# Patient Record
Sex: Female | Born: 1935 | Race: White | Hispanic: No | Smoking: Never smoker
Health system: Southern US, Community
[De-identification: ages and names within clinical notes are randomized; demographics above are authoritative.]

## PROBLEM LIST (undated history)

## (undated) DIAGNOSIS — M109 Gout, unspecified: Secondary | ICD-10-CM

## (undated) DIAGNOSIS — K5909 Other constipation: Secondary | ICD-10-CM

## (undated) DIAGNOSIS — E785 Hyperlipidemia, unspecified: Secondary | ICD-10-CM

## (undated) DIAGNOSIS — E119 Type 2 diabetes mellitus without complications: Secondary | ICD-10-CM

## (undated) DIAGNOSIS — M069 Rheumatoid arthritis, unspecified: Secondary | ICD-10-CM

## (undated) DIAGNOSIS — I10 Essential (primary) hypertension: Secondary | ICD-10-CM

## (undated) DIAGNOSIS — M1990 Unspecified osteoarthritis, unspecified site: Secondary | ICD-10-CM

## (undated) DIAGNOSIS — N184 Chronic kidney disease, stage 4 (severe): Secondary | ICD-10-CM

## (undated) DIAGNOSIS — G459 Transient cerebral ischemic attack, unspecified: Secondary | ICD-10-CM

## (undated) DIAGNOSIS — I4891 Unspecified atrial fibrillation: Secondary | ICD-10-CM

## (undated) DIAGNOSIS — M419 Scoliosis, unspecified: Secondary | ICD-10-CM

## (undated) HISTORY — PX: FOOT SURGERY: SHX648

## (undated) HISTORY — PX: OVARIAN CYST SURGERY: SHX726

## (undated) HISTORY — DX: Rheumatoid arthritis, unspecified: M06.9

## (undated) HISTORY — DX: Other constipation: K59.09

## (undated) HISTORY — PX: APPENDECTOMY: SHX54

## (undated) HISTORY — DX: Scoliosis, unspecified: M41.9

## (undated) HISTORY — PX: GALLBLADDER SURGERY: SHX652

## (undated) HISTORY — PX: ESOPHAGOGASTRODUODENOSCOPY ENDOSCOPY: SHX5814

## (undated) HISTORY — PX: BREAST CYST EXCISION: SHX579

## (undated) HISTORY — PX: EYE SURGERY: SHX253

## (undated) HISTORY — DX: Unspecified osteoarthritis, unspecified site: M19.90

## (undated) HISTORY — PX: HERNIA REPAIR: SHX51

## (undated) HISTORY — PX: DILATION AND CURETTAGE OF UTERUS: SHX78

## (undated) HISTORY — DX: Type 2 diabetes mellitus without complications: E11.9

## (undated) HISTORY — PX: COLONOSCOPY: SHX174

## (undated) HISTORY — PX: CARDIAC CATHETERIZATION: SHX172

## (undated) HISTORY — DX: Gout, unspecified: M10.9

---

## 2003-07-18 ENCOUNTER — Other Ambulatory Visit: Payer: Self-pay

## 2005-02-26 ENCOUNTER — Ambulatory Visit: Payer: Self-pay | Admitting: Internal Medicine

## 2006-03-05 ENCOUNTER — Ambulatory Visit: Payer: Self-pay | Admitting: Internal Medicine

## 2006-07-25 ENCOUNTER — Ambulatory Visit: Payer: Self-pay | Admitting: Internal Medicine

## 2007-03-02 ENCOUNTER — Ambulatory Visit: Payer: Self-pay | Admitting: Ophthalmology

## 2007-03-09 ENCOUNTER — Ambulatory Visit: Payer: Self-pay | Admitting: Internal Medicine

## 2007-03-16 ENCOUNTER — Ambulatory Visit: Payer: Self-pay | Admitting: Ophthalmology

## 2007-04-24 ENCOUNTER — Other Ambulatory Visit: Payer: Self-pay

## 2007-04-25 ENCOUNTER — Inpatient Hospital Stay: Payer: Self-pay | Admitting: Internal Medicine

## 2010-12-03 ENCOUNTER — Ambulatory Visit: Payer: Self-pay | Admitting: Internal Medicine

## 2012-07-22 ENCOUNTER — Ambulatory Visit: Payer: Self-pay | Admitting: Gastroenterology

## 2012-07-24 LAB — PATHOLOGY REPORT

## 2012-10-08 DEATH — deceased

## 2012-11-12 ENCOUNTER — Ambulatory Visit: Payer: Self-pay | Admitting: Gastroenterology

## 2012-12-14 ENCOUNTER — Ambulatory Visit: Payer: Self-pay | Admitting: Ophthalmology

## 2012-12-14 LAB — HEMOGLOBIN: HGB: 8.6 g/dL — ABNORMAL LOW (ref 12.0–16.0)

## 2012-12-14 LAB — POTASSIUM: Potassium: 4.4 mmol/L (ref 3.5–5.1)

## 2012-12-21 ENCOUNTER — Ambulatory Visit: Payer: Self-pay | Admitting: Ophthalmology

## 2013-05-19 ENCOUNTER — Ambulatory Visit (INDEPENDENT_AMBULATORY_CARE_PROVIDER_SITE_OTHER): Payer: Medicare Other | Admitting: Podiatry

## 2013-05-19 ENCOUNTER — Encounter: Payer: Self-pay | Admitting: Podiatry

## 2013-05-19 VITALS — BP 155/59 | HR 68 | Resp 16 | Ht 62.0 in | Wt 153.0 lb

## 2013-05-19 DIAGNOSIS — B351 Tinea unguium: Secondary | ICD-10-CM

## 2013-05-19 DIAGNOSIS — M79609 Pain in unspecified limb: Secondary | ICD-10-CM

## 2013-05-19 NOTE — Progress Notes (Signed)
Jericha presents today chief complaint of painful toenails one through 5 bilateral.  Objective: Vital signs are stable she is alert and oriented x3. Hammertoe deformities are noted bilateral. Distal clavi are noted to second digit bilateral. Nails are thick yellow dystrophic onychomycotic painful palpation.  Assessment: Pain in limb secondary to onychomycosis.  Plan: Debridement of all reactive hyperkeratosis and nails 1 through 5 bilateral is cover service secondary to pain.

## 2013-08-23 ENCOUNTER — Ambulatory Visit: Payer: Medicare Other | Admitting: Podiatry

## 2013-10-04 ENCOUNTER — Encounter: Payer: Self-pay | Admitting: Podiatry

## 2013-10-04 ENCOUNTER — Ambulatory Visit (INDEPENDENT_AMBULATORY_CARE_PROVIDER_SITE_OTHER): Payer: Medicare Other | Admitting: Podiatry

## 2013-10-04 VITALS — BP 118/68 | HR 62 | Resp 18

## 2013-10-04 DIAGNOSIS — M79609 Pain in unspecified limb: Secondary | ICD-10-CM

## 2013-10-04 DIAGNOSIS — B351 Tinea unguium: Secondary | ICD-10-CM

## 2013-10-04 MED ORDER — GABAPENTIN 600 MG PO TABS: 600.0000 mg | ORAL_TABLET | Freq: Three times a day (TID) | ORAL | Status: DC

## 2013-10-04 NOTE — Progress Notes (Signed)
She presents today with a chief complaint of painful elongated toenails and calluses to her toes.  Objective: Vital signs are stable she is alert and oriented x3 pulses are palpable bilateral. Nails are thick yellow dystrophic and mycotic painful palpation and she has painful reactive hyperkeratosis plantar aspect of the bilateral foot.  Assessment: Pain in limb secondary onychomycosis.  Plan: Debridement of nails thickness and length as cover service secondary to pain.

## 2014-01-03 ENCOUNTER — Encounter: Payer: Self-pay | Admitting: Podiatry

## 2014-01-03 ENCOUNTER — Ambulatory Visit: Payer: Self-pay

## 2014-01-03 ENCOUNTER — Ambulatory Visit (INDEPENDENT_AMBULATORY_CARE_PROVIDER_SITE_OTHER): Payer: Medicare Other | Admitting: Podiatry

## 2014-01-03 VITALS — Temp 97.2°F

## 2014-01-03 DIAGNOSIS — I739 Peripheral vascular disease, unspecified: Secondary | ICD-10-CM

## 2014-01-03 DIAGNOSIS — L97509 Non-pressure chronic ulcer of other part of unspecified foot with unspecified severity: Secondary | ICD-10-CM

## 2014-01-03 DIAGNOSIS — B351 Tinea unguium: Secondary | ICD-10-CM

## 2014-01-03 DIAGNOSIS — M79676 Pain in unspecified toe(s): Secondary | ICD-10-CM

## 2014-01-03 DIAGNOSIS — M109 Gout, unspecified: Secondary | ICD-10-CM

## 2014-01-03 DIAGNOSIS — M79609 Pain in unspecified limb: Secondary | ICD-10-CM

## 2014-01-03 MED ORDER — MUPIROCIN 2 % EX OINT: 1.0000 "application " | TOPICAL_OINTMENT | Freq: Two times a day (BID) | CUTANEOUS | Status: DC

## 2014-01-03 MED ORDER — AMOXICILLIN-POT CLAVULANATE 875-125 MG PO TABS: 1.0000 | ORAL_TABLET | Freq: Two times a day (BID) | ORAL | Status: DC

## 2014-01-03 NOTE — Progress Notes (Signed)
She presents today for routine nail debridement. However her daughter states that the end of her toe is black and she refers to the fourth digit of the right foot. She's concerned about the redness on the dorsal medial aspect of the right foot. She also states malodor.  Objective: Pulses are nonpalpable to the right lower extremity mildly palpable to the left lower extremity. Toes appear to be ischemic in that there is considerable palor on dependency. However she has a superficial ulceration to the medial aspect of the first metatarsophalangeal joint with..  Assessment: Pain in limb secondary to onychomycosis 1 through 5 bilateral. Pain in limb secondary to peripheral vascular disease right foot with ulceration distal aspect fourth toe right and medial aspect of the first metatarsophalangeal joint right with cellulitis.  Plan: Debridement of areas of reactive hyperkeratosis and wounds. She will start at home therapy of Epsom salts warm water soaks with application of Bactroban ointment. We also started her on Augmentin 875 one by mouth twice a day. She will also received a Darco shoe and a referral to vascular department.

## 2014-01-05 ENCOUNTER — Telehealth: Payer: Self-pay | Admitting: *Deleted

## 2014-01-05 NOTE — Telephone Encounter (Signed)
Left message for sandi, pts daughter to return my call. appt [facility] 8[facility].15 at 8.30.

## 2014-01-05 NOTE — Telephone Encounter (Signed)
Spoke with sandi letting her know marys appt is at [facility] on 8.24.15 at 8:30. pts daughter understood.

## 2014-01-11 ENCOUNTER — Inpatient Hospital Stay: Payer: Self-pay | Admitting: Internal Medicine

## 2014-01-11 LAB — URINALYSIS, COMPLETE
Bacteria: NONE SEEN
GLUCOSE, UR: NEGATIVE mg/dL (ref 0–75)
Hyaline Cast: 3
LEUKOCYTE ESTERASE: NEGATIVE
Nitrite: NEGATIVE
PH: 5 (ref 4.5–8.0)
PROTEIN: NEGATIVE
SPECIFIC GRAVITY: 1.015 (ref 1.003–1.030)
Squamous Epithelial: 1
WBC UR: <1 /HPF (ref 0–5)

## 2014-01-11 LAB — CBC WITH DIFFERENTIAL/PLATELET
Basophil #: 0.1 10*3/uL (ref 0.0–0.1)
Basophil %: 0.8 %
Eosinophil #: 0.1 10*3/uL (ref 0.0–0.7)
Eosinophil %: 1.9 %
HCT: 27.4 % — ABNORMAL LOW (ref 35.0–47.0)
HGB: 8.8 g/dL — ABNORMAL LOW (ref 12.0–16.0)
Lymphocyte #: 0.8 10*3/uL — ABNORMAL LOW (ref 1.0–3.6)
Lymphocyte %: 11 %
MCH: 32 pg (ref 26.0–34.0)
MCHC: 32 g/dL (ref 32.0–36.0)
MCV: 100 fL (ref 80–100)
MONO ABS: 0.8 x10 3/mm (ref 0.2–0.9)
MONOS PCT: 11.8 %
NEUTROS ABS: 5.3 10*3/uL (ref 1.4–6.5)
Neutrophil %: 74.5 %
Platelet: 192 10*3/uL (ref 150–440)
RBC: 2.74 10*6/uL — AB (ref 3.80–5.20)
RDW: 18.5 % — AB (ref 11.5–14.5)
WBC: 7.1 10*3/uL (ref 3.6–11.0)

## 2014-01-11 LAB — T4, FREE: Free Thyroxine: 0.93 ng/dL (ref 0.76–1.46)

## 2014-01-11 LAB — HEPATIC FUNCTION PANEL A (ARMC)
ALBUMIN: 2.7 g/dL — AB (ref 3.4–5.0)
ALK PHOS: 67 U/L
AST: 22 U/L (ref 15–37)
Bilirubin,Total: 0.3 mg/dL (ref 0.2–1.0)
SGPT (ALT): 20 U/L
Total Protein: 6.9 g/dL (ref 6.4–8.2)

## 2014-01-11 LAB — BASIC METABOLIC PANEL
Anion Gap: 13 (ref 7–16)
BUN: 87 mg/dL — AB (ref 7–18)
CALCIUM: 8.5 mg/dL (ref 8.5–10.1)
Chloride: 103 mmol/L (ref 98–107)
Co2: 21 mmol/L (ref 21–32)
Creatinine: 5.22 mg/dL — ABNORMAL HIGH (ref 0.60–1.30)
EGFR (African American): 8 — ABNORMAL LOW
EGFR (Non-African Amer.): 7 — ABNORMAL LOW
Glucose: 77 mg/dL (ref 65–99)
OSMOLALITY: 299 (ref 275–301)
Potassium: 4.2 mmol/L (ref 3.5–5.1)
Sodium: 137 mmol/L (ref 136–145)

## 2014-01-11 LAB — TROPONIN I

## 2014-01-11 LAB — TSH: THYROID STIMULATING HORM: 2.2 u[IU]/mL

## 2014-01-11 LAB — LIPASE, BLOOD: Lipase: 102 U/L (ref 73–393)

## 2014-01-12 LAB — CBC WITH DIFFERENTIAL/PLATELET
BASOS PCT: 0.8 %
Basophil #: 0.1 10*3/uL (ref 0.0–0.1)
Eosinophil #: 0.2 10*3/uL (ref 0.0–0.7)
Eosinophil %: 2.8 %
HCT: 27.7 % — AB (ref 35.0–47.0)
HGB: 9.1 g/dL — ABNORMAL LOW (ref 12.0–16.0)
Lymphocyte #: 0.7 10*3/uL — ABNORMAL LOW (ref 1.0–3.6)
Lymphocyte %: 10.1 %
MCH: 32.3 pg (ref 26.0–34.0)
MCHC: 33 g/dL (ref 32.0–36.0)
MCV: 98 fL (ref 80–100)
MONOS PCT: 9 %
Monocyte #: 0.6 x10 3/mm (ref 0.2–0.9)
Neutrophil #: 5.1 10*3/uL (ref 1.4–6.5)
Neutrophil %: 77.3 %
PLATELETS: 189 10*3/uL (ref 150–440)
RBC: 2.83 10*6/uL — AB (ref 3.80–5.20)
RDW: 18.3 % — ABNORMAL HIGH (ref 11.5–14.5)
WBC: 6.7 10*3/uL (ref 3.6–11.0)

## 2014-01-12 LAB — PROTEIN / CREATININE RATIO, URINE
CREATININE, URINE: 23.5 mg/dL — AB (ref 30.0–125.0)
PROTEIN, RANDOM URINE: 14 mg/dL — AB (ref 0–12)
PROTEIN/CREAT. RATIO: 596 mg/g{creat} — AB (ref 0–200)

## 2014-01-12 LAB — BASIC METABOLIC PANEL
ANION GAP: 10 (ref 7–16)
BUN: 77 mg/dL — AB (ref 7–18)
CALCIUM: 8.5 mg/dL (ref 8.5–10.1)
CO2: 25 mmol/L (ref 21–32)
Chloride: 104 mmol/L (ref 98–107)
Creatinine: 3.77 mg/dL — ABNORMAL HIGH (ref 0.60–1.30)
EGFR (Non-African Amer.): 10 — ABNORMAL LOW
GFR CALC AF AMER: 12 — AB
Glucose: 107 mg/dL — ABNORMAL HIGH (ref 65–99)
OSMOLALITY: 301 (ref 275–301)
Potassium: 4.9 mmol/L (ref 3.5–5.1)
SODIUM: 139 mmol/L (ref 136–145)

## 2014-01-12 LAB — PHOSPHORUS: Phosphorus: 5.9 mg/dL — ABNORMAL HIGH (ref 2.5–4.9)

## 2014-01-13 LAB — COMPREHENSIVE METABOLIC PANEL
ANION GAP: 8 (ref 7–16)
AST: 21 U/L (ref 15–37)
Albumin: 2.3 g/dL — ABNORMAL LOW (ref 3.4–5.0)
Alkaline Phosphatase: 62 U/L
BILIRUBIN TOTAL: 0.3 mg/dL (ref 0.2–1.0)
BUN: 58 mg/dL — AB (ref 7–18)
CO2: 25 mmol/L (ref 21–32)
Calcium, Total: 8.5 mg/dL (ref 8.5–10.1)
Chloride: 108 mmol/L — ABNORMAL HIGH (ref 98–107)
Creatinine: 2.09 mg/dL — ABNORMAL HIGH (ref 0.60–1.30)
GFR CALC AF AMER: 24 — AB
GFR CALC NON AF AMER: 21 — AB
GLUCOSE: 107 mg/dL — AB (ref 65–99)
Osmolality: 298 (ref 275–301)
POTASSIUM: 3.8 mmol/L (ref 3.5–5.1)
SGPT (ALT): 16 U/L
SODIUM: 141 mmol/L (ref 136–145)
TOTAL PROTEIN: 6.3 g/dL — AB (ref 6.4–8.2)

## 2014-01-13 LAB — CBC WITH DIFFERENTIAL/PLATELET
Basophil #: 0.1 10*3/uL (ref 0.0–0.1)
Basophil %: 1.1 %
EOS ABS: 0.2 10*3/uL (ref 0.0–0.7)
Eosinophil %: 3.3 %
HCT: 26.9 % — AB (ref 35.0–47.0)
HGB: 9.3 g/dL — ABNORMAL LOW (ref 12.0–16.0)
LYMPHS ABS: 1 10*3/uL (ref 1.0–3.6)
LYMPHS PCT: 17.6 %
MCH: 33.2 pg (ref 26.0–34.0)
MCHC: 34.5 g/dL (ref 32.0–36.0)
MCV: 96 fL (ref 80–100)
MONOS PCT: 6.2 %
Monocyte #: 0.3 x10 3/mm (ref 0.2–0.9)
Neutrophil #: 4 10*3/uL (ref 1.4–6.5)
Neutrophil %: 71.8 %
Platelet: 234 10*3/uL (ref 150–440)
RBC: 2.8 10*6/uL — ABNORMAL LOW (ref 3.80–5.20)
RDW: 18.1 % — ABNORMAL HIGH (ref 11.5–14.5)
WBC: 5.5 10*3/uL (ref 3.6–11.0)

## 2014-01-13 LAB — PHOSPHORUS: PHOSPHORUS: 3.8 mg/dL (ref 2.5–4.9)

## 2014-01-13 LAB — PROTEIN ELECTROPHORESIS(ARMC)

## 2014-01-14 LAB — BASIC METABOLIC PANEL
ANION GAP: 7 (ref 7–16)
BUN: 36 mg/dL — ABNORMAL HIGH (ref 7–18)
CALCIUM: 8.8 mg/dL (ref 8.5–10.1)
CO2: 27 mmol/L (ref 21–32)
Chloride: 107 mmol/L (ref 98–107)
Creatinine: 1.52 mg/dL — ABNORMAL HIGH (ref 0.60–1.30)
EGFR (Non-African Amer.): 31 — ABNORMAL LOW
GFR CALC AF AMER: 35 — AB
GLUCOSE: 126 mg/dL — AB (ref 65–99)
OSMOLALITY: 291 (ref 275–301)
Potassium: 4 mmol/L (ref 3.5–5.1)
Sodium: 141 mmol/L (ref 136–145)

## 2014-01-15 LAB — UR PROT ELECTROPHORESIS, URINE RANDOM

## 2014-01-31 ENCOUNTER — Other Ambulatory Visit: Payer: Self-pay | Admitting: *Deleted

## 2014-01-31 MED ORDER — GABAPENTIN 600 MG PO TABS: 600.0000 mg | ORAL_TABLET | Freq: Three times a day (TID) | ORAL | Status: DC

## 2014-01-31 NOTE — Telephone Encounter (Signed)
[facility] sent refill req for gabapentin 600 mg #90 with 11 refills. Take by mouth 3 times daily. Per dr Al Corpus refill.

## 2014-03-30 ENCOUNTER — Ambulatory Visit (INDEPENDENT_AMBULATORY_CARE_PROVIDER_SITE_OTHER): Payer: Medicare Other | Admitting: Podiatry

## 2014-03-30 DIAGNOSIS — B351 Tinea unguium: Secondary | ICD-10-CM

## 2014-03-30 DIAGNOSIS — M79673 Pain in unspecified foot: Secondary | ICD-10-CM

## 2014-03-30 NOTE — Progress Notes (Signed)
She presents today with a chief complaint of painful elongated toenails and calluses plantar aspect the bilateral foot.  Objective: Pulses are palpable bilateral. Nails are thick yellow dystrophic onychomycotic and painful palpation. Hammertoes are rigid and reactive hyperkeratosis distally noted to each toe.  Assessment: Pain in limb secondary to onychomycosis and porokeratosis bilateral.  Plan: Debridement of all reactive hyperkeratosis and debridement of nails 1 through 5 bilateral.

## 2014-06-01 ENCOUNTER — Ambulatory Visit: Payer: Medicare Other | Admitting: Podiatry

## 2014-06-01 ENCOUNTER — Ambulatory Visit (INDEPENDENT_AMBULATORY_CARE_PROVIDER_SITE_OTHER): Payer: Medicare Other | Admitting: Podiatry

## 2014-06-01 DIAGNOSIS — B351 Tinea unguium: Secondary | ICD-10-CM

## 2014-06-01 DIAGNOSIS — L03119 Cellulitis of unspecified part of limb: Secondary | ICD-10-CM

## 2014-06-01 DIAGNOSIS — L02619 Cutaneous abscess of unspecified foot: Secondary | ICD-10-CM

## 2014-06-01 DIAGNOSIS — M79676 Pain in unspecified toe(s): Secondary | ICD-10-CM

## 2014-06-01 MED ORDER — CLINDAMYCIN HCL 150 MG PO CAPS: 150.0000 mg | ORAL_CAPSULE | Freq: Three times a day (TID) | ORAL | Status: DC

## 2014-06-01 NOTE — Progress Notes (Signed)
She presents today with a chief complaint of painful elongated toenails and calluses plantar aspect the bilateral foot. She also is complaining of a red hot swollen legs.  Objective: Pulses are palpable bilateral. Nails are thick yellow dystrophic onychomycotic and painful palpation. Hammertoes are rigid and reactive hyperkeratosis distally noted to each toe. Legs are swollen and tense with erythema ascending to the upper part of the calf.  Assessment: Pain in limb secondary to onychomycosis and porokeratosis bilateral. Cellulitis bilateral lower extremities secondary to venous stasis dermatitis and venous insufficiency.  Plan: Debridement of all reactive hyperkeratosis and debridement of nails 1 through 5 bilateral. Prescription for clindamycin 150 mg 3 times a day for 2 weeks should this not resolve her symptoms she will follow-up with [place] immediately should she develop fever chills nausea vomiting she was instructed to go to the ED.

## 2014-06-22 ENCOUNTER — Ambulatory Visit (INDEPENDENT_AMBULATORY_CARE_PROVIDER_SITE_OTHER): Payer: Medicare Other

## 2014-06-22 ENCOUNTER — Telehealth: Payer: Self-pay | Admitting: Podiatry

## 2014-06-22 ENCOUNTER — Ambulatory Visit (INDEPENDENT_AMBULATORY_CARE_PROVIDER_SITE_OTHER): Payer: Medicare Other | Admitting: Podiatry

## 2014-06-22 VITALS — BP 97/59 | HR 44 | Resp 16

## 2014-06-22 DIAGNOSIS — L03031 Cellulitis of right toe: Secondary | ICD-10-CM

## 2014-06-22 DIAGNOSIS — S92911A Unspecified fracture of right toe(s), initial encounter for closed fracture: Secondary | ICD-10-CM

## 2014-06-22 DIAGNOSIS — L02611 Cutaneous abscess of right foot: Secondary | ICD-10-CM

## 2014-06-22 NOTE — Progress Notes (Signed)
She presents today with her daughter concerned about a red swollen fifth digit of the right foot. She states that her mother seemed to resolve the cellulitis in the leg however states the leg is still painful but has been suffering was with an exquisitely painful fifth digit right foot. Denies any trauma to the foot. Denies fever chills nausea vomiting. Denies any changes in her past medical history medications or allergies.  Objective: Vital signs are stable she is alert and oriented 3. Pulses are palpable right. Erythema and edema in the right calf has subsided however the skin is still tender to the touch. There is no blistering or ulceration noted. The right fifth metatarsophalangeal joint does demonstrate some ecchymosis and fiery red erythema and a painful proximal phalanx fifth digit right foot. Radiographic evaluation does demonstrate what appears to be a transverse fracture of the fifth proximal phalanx right foot. This is nondisplaced non-comminuted. She very well may be having a gout flare associated with this but states the pain is subsiding from its worst. So at this point we will not treat gout.  Assessment: Fractured fifth digit right foot. Possible gouty capsulitis fifth metatarsophalangeal joint right foot.  Plan: Encouraged the use of a Darco shoe which they have at home and I will follow-up with her at her next scheduled appointment. She will watch for signs and symptoms of infection notify us if there are any and proceed to the emergency department.

## 2014-06-30 ENCOUNTER — Ambulatory Visit: Payer: Self-pay | Admitting: Unknown Physician Specialty

## 2014-08-08 ENCOUNTER — Ambulatory Visit (INDEPENDENT_AMBULATORY_CARE_PROVIDER_SITE_OTHER): Payer: Medicare Other | Admitting: Podiatry

## 2014-08-08 DIAGNOSIS — M79676 Pain in unspecified toe(s): Secondary | ICD-10-CM

## 2014-08-08 DIAGNOSIS — B351 Tinea unguium: Secondary | ICD-10-CM

## 2014-08-08 NOTE — Progress Notes (Signed)
She presents today with a chief complaint of painful elongated toenails and calluses plantar aspect the bilateral foot.  Objective: Pulses are palpable bilateral. Nails are thick yellow dystrophic onychomycotic and painful palpation. Hammertoes are rigid and reactive hyperkeratosis distally noted to each toe.  Assessment: Pain in limb secondary to onychomycosis and porokeratosis bilateral.  Plan: Debridement of all reactive hyperkeratosis and debridement of nails 1 through 5 bilateral. 

## 2014-09-30 NOTE — Op Note (Signed)
PATIENT NAME:  Kelsey Chung, Kelsey Chung MR#:  [id number] DATE OF BIRTH:  [birth year]  DATE OF PROCEDURE:  12/21/2012  PREOPERATIVE DIAGNOSIS: Cataract, right eye.   POSTOPERATIVE DIAGNOSIS: Cataract, right eye.   PROCEDURE PERFORMED: Extracapsular cataract extraction using phacoemulsification with placement of an Alcon [id number] 23.5-diopter posterior chamber lens, serial number [id number].   SURGEON: Maylon Peppers. Subrena Devereux, M.D.   ANESTHESIA: 4% lidocaine and 0.75% Marcaine in a 50-50 mixture with 10 units/mL of Hylenex added, given as a peribulbar.   ANESTHESIOLOGIST: Dr. Noralyn Pick.   COMPLICATIONS: None.   ESTIMATED BLOOD LOSS: Less than 1 mL.   DESCRIPTION OF PROCEDURE:  The patient was brought to the operating room and given a peribulbar block.  The patient was then prepped and draped in the usual fashion.  The vertical rectus muscles were imbricated using 5-0 silk sutures.  These sutures were then clamped to the sterile drapes as bridle sutures.  A limbal peritomy was performed extending two clock hours and hemostasis was obtained with cautery.  A partial thickness scleral groove was made at the surgical limbus and dissected anteriorly in a lamellar dissection using an Alcon crescent knife.  The anterior chamber was entered superonasally with a Superblade and through the lamellar dissection with a 2.6 mm keratome.  DisCoVisc was used to replace the aqueous and a continuous tear capsulorrhexis was carried out.  Hydrodissection and hydrodelineation were carried out with balanced salt and a 27 gauge canula.  The nucleus was rotated to confirm the effectiveness of the hydrodissection.  Phacoemulsification was carried out using a divide-and-conquer technique.  Total ultrasound time was 1 minute and 25 seconds with an average power of 24.3 percent and CDE 37.02.  Irrigation/aspiration was used to remove the residual cortex.  DisCoVisc was used to inflate the capsule and the internal incision was enlarged  to 3 mm with the crescent knife.  The intraocular lens was folded and inserted into the capsular bag using the AcrySert delivery system.  Irrigation/aspiration was used to remove the residual DisCoVisc.  Miostat was injected into the anterior chamber through the paracentesis track to inflate the anterior chamber and induce miosis. A tenth of a milliliter of cefuroxime was placed into the anterior chamber via the paracentesis tract. The wound was checked for leaks and none were found. The conjunctiva was closed with cautery and the bridle sutures were removed.  Two drops of 0.3% Vigamox were placed on the eye.   An eye shield was placed on the eye.  The patient was discharged to the recovery room in good condition. ____________________________ Maylon Peppers Denorris Reust, MD sad:sb D: 12/21/2012 12:09:31 ET T: 12/21/2012 13:14:40 ET JOB#: [id number]  cc: Viviann Spare A. Demarus Latterell, MD, <Dictator> Erline Levine MD ELECTRONICALLY SIGNED 12/28/2012 12:12

## 2014-10-01 NOTE — Discharge Summary (Signed)
Dates of Admission and Diagnosis:  Date of Admission 11-Jan-2014   Date of Discharge 14-Jan-2014   Admitting Diagnosis Acute renal failure   Final Diagnosis Acute renal failure, ATN   Discharge Diagnosis 1 Acute renal failure, ATN   2 GERD   3 Scoliosis    Chief Complaint/History of Present Illness 81 year old lady admitted with acute renal failure and poor oral intake, secondary to glossitis. Patient recently took a course of Amoxicillin. Initial creatinine of 5.22 with normal potassium level of 4.2   Allergies:  Sulfa drugs: Anaphylaxis  Quinidine: Rash  Excedrin: Other  Beta Blockers: Unknown  NSAIDS: Other    Thyroid:  04-Aug-15 13:18   Thyroid Stimulating Hormone 2.20 (0.45-4.50 (International Unit)  ----------------------- Pregnant patients have  different reference  ranges for TSH:  - - - - - - - - - -  Pregnant, first trimetser:  0.36 - 2.50 uIU/mL)  Thyroxine, Free 0.93 (Result(s) reported on 11 Jan 2014 at 08:00PM.)  Hepatic:  04-Aug-15 13:18   Bilirubin, Total 0.3  Alkaline Phosphatase 67 (46-116 NOTE: New Reference Range 12/28/13)  SGPT (ALT) 20 (14-63 NOTE: New Reference Range 12/28/13)  SGOT (AST) 22  Total Protein, Serum 6.9  Albumin, Serum  2.7  Bilirubin, Direct < 0.1 (Result(s) reported on 11 Jan 2014 at 05:56PM.)  General Ref:  04-Aug-15 13:18   T3 Uptake ========== TEST NAME ==========  ========= RESULTS =========  = REFERENCE RANGE =  T3 UPTAKE  T3 Uptake T3 Uptake                       [   28 %                 ]             [address]               [facility]            No: [id number]          [address] [place], [place] [address]           Lindon Romp, MD         [phone]   Result(s) reported on 12 Jan 2014 at 06:17AM.  Free T3, Serum ========== TEST NAME ==========  ========= RESULTS =========  = REFERENCE RANGE =  FREE T3, SERUM  Triiodothyronine,Free,Serum Triiodothyronine,Free,Serum     [L  1.8  pg/mL            ]           2.0-4.4               [facility]     No: [id number]           [address], [place], [place] [address]           Lindon Romp, MD         [phone]   Result(s) reported on 12 Jan 2014 at 08:19AM.  05-Aug-15 13:23   ANA w/Reflex to 9 Conf.Tests ========== TEST NAME ==========  ========= RESULTS =========  = REFERENCE RANGE =  ANA W/REFLEX-9 CONF.TEST  ANA w/Reflex if Positive ANA Direct                      [   Negative             ]          Negative  [facility]            No: [id number]           [address] [place], [place] [address]           Lindon Romp, MD         [phone]   Result(s) reported on 13 Jan 2014 at 05:28PM.  Parathyroid Hormone, Intact ========== TEST NAME ==========  ========= RESULTS =========  = REFERENCE RANGE =  PTH INTACT  PTH, Intact PTH, Intact                     [H  80 pg/mL             ]             [address]               [facility]            No: [id number]           [address], [place], [place] [address]           Lindon Romp, MD         [phone]   Result(s) reported on 13 Jan 2014 at 08:22AM.  Protein Electrophoresis, Serum ========== TEST NAME ==========  ========= RESULTS =========  = REFERENCE RANGE =  PROTEIN ELECTROPHORESIS  Protein Electro.,S Protein, Total, Serum           [L  5.8 g/dL             ]           6.0-8.5 Albumin                         [L  3.0 g/dL             ]           3.2-5.6 Alpha-1-Globulin                [   0.3 g/dL             ]           0.1-0.4 Alpha-2-Globulin                [   0.9 g/dL             ]           0.4-1.2 Beta Globulin                   [   0.7 g/dL             ]           0.6-1.3 Gamma Globulin                  [   0.9 g/dL             ]           0.5-1.6 M-Spike                         [   Not Observed g/dL    ]      Not Observed Globulin, Total                 [   2.8 g/dL              ]  2.0-4.5 A/G Ratio                       [   1.1                  ]           0.7-2.0 Please note:                    [   Final Report         ]                   Protein electrophoresis scan will follow via computer, mail, or courier delivery.             [facility]            No: [id number]           [address] [place], [place] [address]           Lindon Romp, MD         [phone]   Result(s) reported on 13 Jan 2014 at 05:28PM.  Vitamin D, 25-Hydroxy, Serum ========== TEST NAME ==========  ========= RESULTS =========  = REFERENCE RANGE =  VITAMIN D  Vitamin D, 25-Hydroxy Vitamin D, 25-Hydroxy           [   37.0 ng/mL           ]        30.0-100.0 Vitamin D deficiency has been defined by the [place] practice guideline as a level of serum 25-OH vitamin D less than 20 ng/mL (1,2). The Endocrine Society went on to further define vitamin D insufficiency as a level between 21 and 29 ng/mL (2). 1. IOM (Institute of Medicine). 2010. Dietary reference    intakes for calcium and D. [place]: The    [employer]. 2. Holick MF, Binkley Stanton, Bischoff-Ferrari HA, et al.    Evaluation, treatment, and prevention of vitamin D    deficiency: an Endocrine Society clinical practice    guideline. JCEM. 2011 Jul; 96(7):1911-30.               [facility]            No: [id number]           [address], [place], [place] [address]           Lindon Romp, MD         [phone]   Result(s) reported on 13 Jan 2014 at 05:28PM.  Routine Chem:  04-Aug-15 13:18   Glucose, Serum 77  BUN  87  Creatinine (comp)  5.22  Sodium, Serum 137  Potassium, Serum 4.2  Chloride, Serum 103  CO2, Serum 21  Calcium (Total), Serum 8.5  Anion Gap 13  Osmolality (calc) 299  eGFR (African American)  8  eGFR (Non-African American)  7 (eGFR values <40m/min/1.73 m2 may be an indication of chronic kidney  disease (CKD). Calculated eGFR is useful in patients with stable renal function. The eGFR calculation will not be reliable in acutely ill patients when serum creatinine is changing rapidly. It is not useful in  patients on dialysis. The eGFR calculation may not be applicable to patients at the low and high extremes of body sizes, pregnant women, and vegetarians.)  Lipase 102 (Result(s) reported on 11 Jan 2014 at 05:45PM.)  05-Aug-15 04:07   Creatinine (comp)  3.77  06-Aug-15 04:13   Creatinine (comp)  2.09  07-Aug-15 04:27   Glucose, Serum  126  BUN  36  Creatinine (comp)  1.52  Sodium, Serum 141  Potassium, Serum 4.0  Chloride, Serum 107  CO2, Serum 27  Calcium (Total), Serum 8.8  Anion Gap 7  Osmolality (calc) 291  eGFR (African American)  35  eGFR (Non-African American)  31 (eGFR values <40m/min/1.73 m2 may be an indication of chronic kidney disease (CKD). Calculated eGFR is useful in patients with stable renal function. The eGFR calculation will not be reliable in acutely ill patients when serum creatinine is changing rapidly. It is not useful in  patients on dialysis. The eGFR calculation may not be applicable to patients at the low and high extremes of body sizes, pregnant women, and vegetarians.)  Cardiac:  04-Aug-15 13:18   Troponin I < 0.02 (0.00-0.05 0.05 ng/mL or less: NEGATIVE  Repeat testing in 3-6 hrs  if clinically indicated. >0.05 ng/mL: POTENTIAL  MYOCARDIAL INJURY. Repeat  testing in 3-6 hrs if  clinically indicated. NOTE: An increase or decrease  of 30% or more on serial  testing suggests a  clinically important change)  Routine Hem:  04-Aug-15 13:18   WBC (CBC) 7.1  RBC (CBC)  2.74  Hemoglobin (CBC)  8.8  Hematocrit (CBC)  27.4  Platelet Count (CBC) 192  MCV 100  MCH 32.0  MCHC 32.0  RDW  18.5  Neutrophil % 74.5  Lymphocyte % 11.0  Monocyte % 11.8  Eosinophil % 1.9  Basophil % 0.8  Neutrophil # 5.3  Lymphocyte #  0.8  Monocyte # 0.8   Eosinophil # 0.1  Basophil # 0.1 (Result(s) reported on 11 Jan 2014 at 01:50PM.)   PERTINENT RADIOLOGY STUDIES: XRay:    06-Aug-15 14:16, Thoracic Spine AP and Lateral  Thoracic Spine AP and Lateral   REASON FOR EXAM:    mid back pain  COMMENTS:       PROCEDURE: DXR - DXR THORACIC  AP AND LATERAL  - Jan 13 2014  2:16PM     CLINICAL DATA:  Back pain    EXAM:  THORACIC SPINE - 2 VIEW    COMPARISON:  None.    FINDINGS:  Scoliosis concave to the left in the lumbar spine is noted with some  compensatory changes in the thoracic spine. No hemivertebra are  noted. The pedicles are within normal limits and no paraspinal mass  lesion is noted. No definitive compression deformities are seen.  Diffuse aortic calcifications are noted.     IMPRESSION:  No acute abnormality seen.      Electronically Signed    By: MInez CatalinaM.D.    On: 01/13/2014 14:19         Verified By: MEverlene Farrier M.D.,  [place]    05-Aug-15 10:35, [place]  [place]   REASON FOR EXAM:    acute renal failure  COMMENTS:       PROCEDURE: [place] - [place]  - Jan 12 2014 10:35AM     CLINICAL DATA:  Acute renal failure.    EXAM:  RENAL/URINARY TRACT ULTRASOUND COMPLETE    COMPARISON:  None.    FINDINGS:  Right Kidney:  Length: Approximately 9.2 cm. No hydronephrosis. Diffuse cortical  thinning. Normal parenchymal echotexture. No focal parenchymal  abnormality. No visible shadowing calculi.    Left Kidney:    Length: Approximately 8.5 cm. No hydronephrosis. Diffuse cortical  thinning.  Normal parenchymal echotexture. No focal parenchymal  abnormality. No visible shadowing calculi.    Bladder:    Decompressed by Foley catheter.     IMPRESSION:  1. No evidence of hydronephrosis involving either kidney.  2. Diffuse cortical thinning involving both kidneys without focal  parenchymal abnormalities.      Electronically Signed    By: Evangeline Dakin M.D.    On: 01/12/2014  11:37         Verified By: Deniece Portela, M.D.,   Pertinent Past History:  Pertinent Past History Scoliosis GERD   Hospital Course:  Hospital Course 81 year old lady admitted with acute renal failure and poor oral intake, secondary to glossitis. Patient recently took a course of Amoxicillin. Acute renal failure: likely ATN, creatinine improved significantly with i.v hydration. Patient was evaluated by nephrology and renal u/s was done. Pantoprazole was continued for GERD. Back pain relieved by tramadol prn. 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 Creatinine improved to 1.52 prior to discharge. Patient will follow up with nephrology and Dr. Ginette Pitman as out-patient. Exam: Alert, oriented, NAD. HENT: No JVD, Chest: clear, CVS: RRR. Abdomen: benign. Ext: no edema. Plan is to discharge home today. Family member at bedside. PPI, Omeprazole discontinued for interaction with Methotrexate. Switched to Ranitidine 150 mg daily. Patient will resume her usual antihypertensive medications at home. Patient will follow up with Dr. Ginette Pitman and Nephrology as out patient.   Condition on Discharge Stable   DISCHARGE INSTRUCTIONS HOME MEDS:  Medication Reconciliation: Patient's Home Medications at Discharge:     Medication Instructions  gabapentin 300 mg oral capsule  1 cap(s) orally 3 times a day    losartan 100 mg oral tablet  1 tab(s) orally once a day   hydrochlorothiazide 25 mg oral tablet  1 tab(s) orally 2 times a day   clonidine 0.2 mg oral tablet  1 tab(s) orally 2 times a day   tramadol 50 mg oral tablet  1 tab(s) orally 3 times a day   allopurinol 100 mg oral tablet  1 tab(s) orally 2 times a day   hydralazine 25 mg oral tablet  1 tab(s) orally 3 times a day   simvastatin 20 mg oral tablet  1 tab(s) orally once a day (at bedtime)   methotrexate 2.5 mg oral tablet  4 tab(s) orally once a week on Monday   glipizide 5 mg oral tablet  1 tab(s) orally 2 times a day   vitamin b-12   1 tab(s) orally once a day   ferrous sulfate 325 mg oral tablet  1 tab(s) orally once a day   chondroitin-glucosamine with multiple vitamins and minerals oral capsule  1 cap(s) orally 2 times a day   vitamin d3  1 tab(s) orally once a day   multivitamin  1 tab(s) orally once a day   lactulose 10 g/15 ml oral syrup  15 milliliter(s) orally 2 times a day   fish oil - oral capsule  1 cap(s) orally 2 times a day   turmeric tablet  1 tab(s) orally once a day   ropinirole 2 mg oral tablet  2 tab(s) orally once a day (at bedtime), As Needed for legs   mupirocin topical 2% topical ointment  1 application nasal 2 times a day   triamcinolone topical 0.1% topical cream  Apply topically to affected area on legs 2 times a day   ranitidine  150 milligram(s) orally once a day -for Indigestion, Heartburn  thiamine 250 mg oral tablet  2 tab(s) orally once a day   antacid/nystatin/diphenhydramine mouthwash  10 milliliter(s) orally 3 times a day, As Needed - for Indigestion, Heartburn    PRESCRIPTIONS: ELECTRONICALLY SUBMITTED  STOP TAKING THE FOLLOWING MEDICATION(S):    omeprazole 20 mg oral delayed release capsule: 1 cap(s) orally once a day amoxicillin-clavulanate 875 mg-125 mg oral tablet: 1 tab(s) orally 2 times a day for 10 days  Physician's Instructions:  Diet Renal Diet   Activity Limitations As tolerated   Return to Work Not Applicable   Time frame for Follow Up Appointment 1-2 weeks  Dr. Ginette Pitman   Time frame for Follow Up Appointment 1-2 weeks  Nephrology     Ginette Pitman, Vishwanath(Attending Physician): [facility], [address], [place], [place] [address], Bruce, [place] N(Consultant): [facility], [address] [place], [place], [place] [address], [place] [phone]  Electronic Signatures: Glendon Axe (MD)  (Signed 07-Aug-15 12:21)  Authored: ADMISSION DATE AND DIAGNOSIS, CHIEF COMPLAINT/HPI, Allergies, PERTINENT LABS,  PERTINENT RADIOLOGY STUDIES, PERTINENT PAST HISTORY, HOSPITAL COURSE, [place], PATIENT INSTRUCTIONS, Follow Up Physician   Last Updated: 07-Aug-15 12:21 by Glendon Axe (MD)

## 2014-10-01 NOTE — H&P (Signed)
PATIENT NAME:  Kelsey Chung, Kelsey Chung MR#:  [id number] DATE OF BIRTH:  [birth year]  DATE OF ADMISSION:  01/11/2014  PRIMARY CARE PHYSICIAN:  Barbette Reichmann, MD REFERRING EMERGENCY ROOM PHYSICIAN: Eartha Inch. York Cerise, MD  CHIEF COMPLAINT: Dizziness and tongue swelling.   HISTORY OF PRESENT ILLNESS: An 81 year old female with history of diabetes, hypertension and hyperlipidemia, with gout, osteoarthritis, and  had chronic renal failure with baseline creatinine of 1.7, in 11/2013. Lives with her husband who is 92 years, and with Alzheimer disease, having some hospice at home for her husband; patient is able to walk with a walker, but very slow. Her daughter lives nearby and is taking care almost every day for the patient and her husband. For the last few days, she is not eating or drinking enough and getting weak and dizzy, and since yesterday she started complaining of some burning sensation in her tongue. She said that she did not eat something which could burn her tongue, but she has the sensation, and she has also noticed some swelling on her tongue. So, daughter took her to walk-in clinic and they referred her to Emergency Room. In ER, on initial work-up they found her creatinine more than 5, and so given to hospitalist team for admission.   Patient has some hearing deficit and so history obtained from her daughter, Ms. [name] who is healthcare power of attorney and present in the room.  According to her she did not have any shortness of breath or swelling in the legs, but she has decreased urination for the last 1 to 2 days.   PAST MEDICAL HISTORY:  1.  Diabetes.  2.  Hypertension.  3.  Hyperlipidemia.  4.  Gout.  5.  Osteoarthritis.  6.  A Transient  ischemic attacks.  7.  Gastroesophageal reflux disease.  8.  Chronic renal failure, creatinine 1.7 in 11/2013.   REVIEW OF SYSTEMS: Unable to get as patient has hearing deficit.   PAST SURGICAL HISTORY:  1.  Appendectomy.  2.  Ovarian cyst removal.   3.  Breast cyst removal.  4.  Cholecystectomy.  5.  Hernia removal surgery.  6.  Toe surgery and amputation.   SOCIAL HISTORY: Lives with her husband. No smoking or alcohol or drug use.   FAMILY HISTORY: Father had history of atherosclerosis. No history of coronary artery disease in family; history of breast cancer and hypertension in the family.   HOME MEDICATIONS: 1.  Vitamin D3 one tablet once a day.  2.  Vitamin B12 once a day.  4.  Triamcinolone topical 0.1% topical cream to affected area 2 times a day.  5.  Tramadol 50 mg oral 2 to 3 times a day.  6.  Simvastatin 20 mg oral once a day.  8.  Omeprazole 20 mg oral once a day.  9.  Multivitamin tablet once a day.  10.  Methotrexate 2.5 mg oral tablet 4 tablets once a week.  11.  Losartan 100 mg oral once a day.  12.  Lactulose 15 mL oral 2 times a day.  13.  Hydrochlorothiazide 25 mg 2 times a day.  14.  Hydralazine 25 mg oral 3 times a day.  15.  Glipizide 5 mg oral tablet 2 times a day.  16.  Gabapentin 300 mg oral 3 times a day.  17.  Ferrous sulfate 325 mg oral once a day.  18.  Clonidine 0.2 mg oral 2 times a day.  19.  Amoxicillin and clavulanate 2 times a day.  VITAL SIGNS: In ER, temperature 98.4, pulse is 54, respirations 20, blood pressure 100/44, pulse oximetry is 97% on room air.   PHYSICAL EXAMINATION:  GENERAL: The patient is fully alert and oriented, slight hearing deficit but appropriate with history taking and physical examination.  HEENT: Head and neck atraumatic; conjunctivae pink, oral mucosa moist.  NECK: Supple. No JVD.  RESPIRATORY: Bilateral equal and clear air entry.  CARDIOVASCULAR: S1, S2, present, regular, systolic murmur present.  ABDOMEN: Soft, nontender. Bowel sounds present. No organomegaly.  SKIN: No rashes.  LEGS: No edema.  NEUROLOGIC: Power 4 out of 5, moving all 4 limbs.  JOINTS: No swelling or tenderness.    Glucose 77, BUN 87,  sodium 137, potassium 4.2, chloride 103, CO2 is 21,  calcium is 8.5, troponin less than 0.02, WBC 7.1, hemoglobin 8.8, platelet count 192,000, MCV 100.   Chest x-ray no congestion.   ASSESSMENT AND PLAN: An 81 year old female with multiple medical problems and chronic renal failure with her decreased eating for the last few days; came to hospital with tongue swelling and problem of burning and found having acute renal failure.  1.  Acute on chronic renal failure. Creatinine is 5.2, no pulmonary edema, or signs of respiratory distress; so we will admit with intravenous fluid and continue monitoring with nephrology follow-ups in the hospital. Check renal function tomorrow; currently no electrolyte imbalance, so no urgent need of hemodialysis.  2.  Glossitis.  Patient is complaining of tongue burning sensation. On examination the tongue is slightly red with some small papules all over and some black dots, most likely it is glossitis,  vitamin deficiency. We will continue on multivitamins and further work-up as per primary care physician.  3.  Hypertension. Blood pressure is stable. We will continue home medication but hold hydrochlorothiazide as renal failure.  4.  Hyperlipidemia: Continue statin.  5.  Diabetes. We will hold oral agents because of renal failure and we will put her on insulin sliding scale coverage.   Healthcare power of attorney is Ms. [name], her daughter, who is present in the room.   TOTAL TIME SPENT IN THIS ADMISSION: 50 minutes.    ____________________________ Hope Pigeon Elisabeth Pigeon, MD vgv:nt D: 01/11/2014 17:38:41 ET T: 01/11/2014 18:02:49 ET JOB#: [id number]  cc: Hope Pigeon. Elisabeth Pigeon, MD, <Dictator> Barbette Reichmann, MD  Altamese Dilling MD ELECTRONICALLY SIGNED 01/17/2014 8:19

## 2014-10-10 ENCOUNTER — Ambulatory Visit (INDEPENDENT_AMBULATORY_CARE_PROVIDER_SITE_OTHER): Payer: Medicare Other | Admitting: Podiatry

## 2014-10-10 DIAGNOSIS — B351 Tinea unguium: Secondary | ICD-10-CM | POA: Diagnosis not present

## 2014-10-10 DIAGNOSIS — L89891 Pressure ulcer of other site, stage 1: Secondary | ICD-10-CM

## 2014-10-10 DIAGNOSIS — M79676 Pain in unspecified toe(s): Secondary | ICD-10-CM

## 2014-10-10 DIAGNOSIS — L97521 Non-pressure chronic ulcer of other part of left foot limited to breakdown of skin: Secondary | ICD-10-CM

## 2014-10-10 MED ORDER — CLINDAMYCIN HCL 150 MG PO CAPS: 150.0000 mg | ORAL_CAPSULE | Freq: Three times a day (TID) | ORAL | Status: DC

## 2014-10-10 MED ORDER — MUPIROCIN 2 % EX OINT: 1.0000 "application " | TOPICAL_OINTMENT | Freq: Two times a day (BID) | CUTANEOUS | Status: DC

## 2014-10-10 NOTE — Progress Notes (Signed)
She presents today with a chief complaint of painful elongated toenails and calluses plantar aspect the bilateral foot. Also, tender L sub 1 area recently.  Objective: Pulses are palpable bilateral. Nails are thick yellow dystrophic onychomycotic and painful palpation. Hammertoes are rigid and reactive hyperkeratosis distally noted to each toe. Superficial ulceration is apparent some first metatarsophalangeal joint of the left foot with mild surrounding erythema. Once debridement and demonstrates no purulence no malodor superficial skin breakdown some first metatarsophalangeal joint overlying the tibial sesamoid.  Assessment: Pain in limb secondary to onychomycosis and porokeratosis bilateral. Ulceration left with cellulitis.  Plan: Debridement of all reactive hyperkeratosis and debridement of nails 1 through 5 bilateral. Wrote prescription for clindamycin twice daily as well as Bactroban ointment to be applied after soaking. I will follow-up with her for the ulceration in 2 weeks and nail debridement in 3 months.

## 2014-10-14 ENCOUNTER — Other Ambulatory Visit: Payer: Self-pay

## 2014-10-14 ENCOUNTER — Telehealth: Payer: Self-pay

## 2014-10-14 MED ORDER — DOXYCYCLINE HYCLATE 100 MG PO TABS: 100.0000 mg | ORAL_TABLET | Freq: Two times a day (BID) | ORAL | Status: DC

## 2014-10-14 NOTE — Telephone Encounter (Signed)
Pt daughter called stating that her mother's eyes were becoming red and swollen and worsened over night, she also stated that her mother was feeling nauseated. Pt was put on clindamycin on Monday. Advised her to stop taking the current antibiotic, and i would call in a replacement abt ( doxycycline), daughter stated that her mother has not had any reactions to doxycycline in the past. Advise her to watch for s/s of reaction n/v, swelling, difficulty breathing and if she experiences any of these symptoms she is to stop the medication and seek medical advise.

## 2014-10-24 ENCOUNTER — Ambulatory Visit (INDEPENDENT_AMBULATORY_CARE_PROVIDER_SITE_OTHER): Payer: Medicare Other | Admitting: Podiatry

## 2014-10-24 DIAGNOSIS — L97521 Non-pressure chronic ulcer of other part of left foot limited to breakdown of skin: Secondary | ICD-10-CM

## 2014-10-24 NOTE — Progress Notes (Signed)
She presents today for follow-up of ulceration sub-first metatarsophalangeal joint of the left foot. She continues CONSERVATIVE therapies. She denies fever chills nausea vomiting muscle aches and pains.  Objective: Vital signs are stable she is alert and oriented 3. Reactive hyperkeratosis of first metatarsophalangeal joint left was debrided reveals a 1 cm long fissure that does not probe deep to bone superficial reactive hygienic granuloma is present. Debrided that for her today. No malodor.  Assessment: Superficial ulceration sub-first metatarsophalangeal joint left foot.  Plan: Continue all conservative therapies soaking and dressing applications. I'll follow-up with her in 2 weeks.

## 2014-11-09 ENCOUNTER — Ambulatory Visit (INDEPENDENT_AMBULATORY_CARE_PROVIDER_SITE_OTHER): Payer: Medicare Other | Admitting: Podiatry

## 2014-11-09 VITALS — BP 179/84 | HR 90 | Resp 16

## 2014-11-09 DIAGNOSIS — L97521 Non-pressure chronic ulcer of other part of left foot limited to breakdown of skin: Secondary | ICD-10-CM

## 2014-11-09 DIAGNOSIS — L89891 Pressure ulcer of other site, stage 1: Secondary | ICD-10-CM | POA: Diagnosis not present

## 2014-11-09 NOTE — Progress Notes (Signed)
She presents today for follow-up of ulceration sub-first metatarsophalangeal joint of the left foot. Her daughter states that she thinks is doing much better.  Objective: Vital signs are stable she is alert and oriented 3. Pulses remain unchanged superficial ulceration plantar aspect first metatarsophalangeal joint does not appear to be infected. Reactive hyperkeratosis is present. I debrided this today and debrided the wound to bleeding. There is no signs of infection.  Assessment: Ulceration plantar aspect left foot.  Plan: Continue all conservative therapies follow up with her in 3 weeks.

## 2014-11-30 ENCOUNTER — Encounter: Payer: Self-pay | Admitting: Podiatry

## 2014-11-30 ENCOUNTER — Ambulatory Visit (INDEPENDENT_AMBULATORY_CARE_PROVIDER_SITE_OTHER): Payer: Medicare Other | Admitting: Podiatry

## 2014-11-30 VITALS — BP 125/68 | HR 86 | Resp 16

## 2014-11-30 DIAGNOSIS — L97521 Non-pressure chronic ulcer of other part of left foot limited to breakdown of skin: Secondary | ICD-10-CM

## 2014-11-30 DIAGNOSIS — L89891 Pressure ulcer of other site, stage 1: Secondary | ICD-10-CM

## 2014-11-30 NOTE — Progress Notes (Signed)
She presents today for follow-up of ulceration sub-first metatarsophalangeal joint of the left foot. Her daughter states that she thinks is doing much better and patient still relates pain with ambulation.   Objective: Vital signs are stable she is alert and oriented 3. Pulses remain unchanged superficial ulceration plantar aspect first metatarsophalangeal joint left does not appear to be infected as well as the 4th digit distal tip right. Reactive hyperkeratosis is present. I debrided this today and debrided the wound to bleeding. There is no signs of infection.  Assessment: Ulceration plantar aspect left foot and distal tip 4th toe right.  Plan: Continue all conservative therapies including epsom salt soaks and mupirocin ointment. Will follow up with her in 2 weeks.

## 2014-12-14 ENCOUNTER — Ambulatory Visit (INDEPENDENT_AMBULATORY_CARE_PROVIDER_SITE_OTHER): Payer: Medicare Other | Admitting: Podiatry

## 2014-12-14 ENCOUNTER — Ambulatory Visit: Payer: Medicare Other | Admitting: Podiatry

## 2014-12-14 DIAGNOSIS — B351 Tinea unguium: Secondary | ICD-10-CM

## 2014-12-14 DIAGNOSIS — M79676 Pain in unspecified toe(s): Secondary | ICD-10-CM | POA: Diagnosis not present

## 2014-12-14 DIAGNOSIS — L89891 Pressure ulcer of other site, stage 1: Secondary | ICD-10-CM

## 2014-12-14 DIAGNOSIS — I739 Peripheral vascular disease, unspecified: Secondary | ICD-10-CM

## 2014-12-14 DIAGNOSIS — L03031 Cellulitis of right toe: Secondary | ICD-10-CM

## 2014-12-14 DIAGNOSIS — L02611 Cutaneous abscess of right foot: Secondary | ICD-10-CM

## 2014-12-14 DIAGNOSIS — M79673 Pain in unspecified foot: Secondary | ICD-10-CM

## 2014-12-14 MED ORDER — CEPHALEXIN 500 MG PO CAPS: 500.0000 mg | ORAL_CAPSULE | Freq: Three times a day (TID) | ORAL | Status: DC

## 2014-12-14 NOTE — Progress Notes (Signed)
She presents today for follow-up of ulceration plantar aspect of the left foot and distal aspect of the third digit right foot. She states that she has more redness and swelling in her legs and her daughter confirms 2 small areas of concern one on each leg. She denies fever chills nausea vomiting muscle aches and pains.  Objective: Pulses are minimally palpable bilateral. Edema to the bilateral legs and calves. She has erythema surrounding a superficial skin infection measuring less than 2 cm in diameter right anterolateral leg mid diaphyseal region. Left leg demonstrates a very small abrasion less than a 5 mm in diameter. No signs of infection there. I'm concerned for cellulitis due to the venous insufficiency and venous stasis dermatitis that she is developed. The ulcerations appear to be healing nicely with the overlying reactive hyperkeratosis which was debrided today does not demonstrate any active ulcerative lesion. Toenails are thick yellow dystrophic like mycotic and painful palpation as well as debridement.  Assessment: Peripheral vascular disease with venous insufficiency stemming to cellulitis with superficial ulcerative lesion anterior right leg. Ulcerations plantar aspect of the bilateral foot appear to be healing after debridement. Debrided painful elongated thickened dystrophic mycotic nails.  Plan: Debrided ulcerations today. Place her on Keflex 500 mg 1 by mouth 3 times a day. Started her with elevation of the bilateral lower extremity and light dressing changes to the right leg. Debrided nails bilaterally. Follow up with her in 1 month

## 2015-01-16 ENCOUNTER — Ambulatory Visit (INDEPENDENT_AMBULATORY_CARE_PROVIDER_SITE_OTHER): Payer: Medicare Other | Admitting: Podiatry

## 2015-01-16 ENCOUNTER — Encounter: Payer: Self-pay | Admitting: Podiatry

## 2015-01-16 VITALS — BP 142/68 | HR 99 | Resp 16

## 2015-01-16 DIAGNOSIS — L97521 Non-pressure chronic ulcer of other part of left foot limited to breakdown of skin: Secondary | ICD-10-CM

## 2015-01-16 DIAGNOSIS — L89891 Pressure ulcer of other site, stage 1: Secondary | ICD-10-CM

## 2015-01-16 NOTE — Progress Notes (Signed)
She presents today for follow-up of her ulceration to the third digit of the right foot. She denies fever chills nausea vomiting muscle aches and pains and states it seems to be doing better.  Objective: Vital signs are stable she is alert and oriented 3. Pulses are barely palpable bilateral capillary fill time is immediate. Feet are warm to the touch. Ulceration third digit right foot demonstrates well-healing site with reactive hyperkeratosis. Recommended hyperkeratosis is also noted to the plantar aspect first metatarsophalangeal joint left foot.  Assessment: Well-healing ulcerations.  Plan: Debridement of the wounds today to bleeding no signs of infection. Redressed dry sterile compressive dressing follow-up with her in 3-4 weeks.

## 2015-02-01 ENCOUNTER — Other Ambulatory Visit: Payer: Self-pay | Admitting: Podiatry

## 2015-02-01 NOTE — Telephone Encounter (Signed)
Pt needs an appt prior to future refills. 

## 2015-02-15 ENCOUNTER — Ambulatory Visit (INDEPENDENT_AMBULATORY_CARE_PROVIDER_SITE_OTHER): Payer: Medicare Other | Admitting: Podiatry

## 2015-02-15 ENCOUNTER — Encounter: Payer: Self-pay | Admitting: Podiatry

## 2015-02-15 VITALS — BP 148/84 | HR 82 | Resp 16

## 2015-02-15 DIAGNOSIS — L89891 Pressure ulcer of other site, stage 1: Secondary | ICD-10-CM | POA: Diagnosis not present

## 2015-02-15 DIAGNOSIS — L97521 Non-pressure chronic ulcer of other part of left foot limited to breakdown of skin: Secondary | ICD-10-CM

## 2015-02-15 MED ORDER — CLINDAMYCIN HCL 150 MG PO CAPS: 150.0000 mg | ORAL_CAPSULE | Freq: Three times a day (TID) | ORAL | Status: DC

## 2015-02-15 NOTE — Progress Notes (Signed)
She presents today with chief complaint of a painful right fourth toe. She denies fever chills nausea vomiting muscle aches and pains. She states this than a little red and tender.  Objective: Vital signs are stable she is alert and oriented 3 pulses are palpable right. Hammertoe deformity fourth digit right foot resulting in a distal clavus once debrided does demonstrate mild. Some mild odor with mild erythema to the level of the PIPJ.  Assessment: Hammertoe deformity with chronic ulceration right foot.  Plan: Start her back on clindamycin and she will start her soaks. I will follow-up with her in 2 weeks.

## 2015-03-06 DIAGNOSIS — I10 Essential (primary) hypertension: Secondary | ICD-10-CM | POA: Insufficient documentation

## 2015-03-20 ENCOUNTER — Ambulatory Visit (INDEPENDENT_AMBULATORY_CARE_PROVIDER_SITE_OTHER): Payer: Medicare Other | Admitting: Podiatry

## 2015-03-20 ENCOUNTER — Encounter: Payer: Self-pay | Admitting: Podiatry

## 2015-03-20 VITALS — BP 143/72 | HR 88 | Resp 16

## 2015-03-20 DIAGNOSIS — L89891 Pressure ulcer of other site, stage 1: Secondary | ICD-10-CM

## 2015-03-20 DIAGNOSIS — L97521 Non-pressure chronic ulcer of other part of left foot limited to breakdown of skin: Secondary | ICD-10-CM

## 2015-03-20 NOTE — Progress Notes (Signed)
She presents today for follow-up of ulceration to the fourth digit of the right foot and sub-first metatarsophalangeal joint of the left foot. She states that my toes just a sore all the time I don't know whether to wear shoes are not to wear shoes.  Objective: Vital signs are stable she's alert and oriented 3. Pulses are barely palpable bilateral hammertoe deformities are noted bilateral resulting in a distal clavus was soft tissue breakdown to the distal aspect of the fourth digit right foot. She also has mallet toe deformity of the left foot resulting in pressure beneath the first metatarsophalangeal joint of the left foot which was debrided does demonstrate superficial ulceration. Neither one of these ulcerations today demonstrate any type of purulence or malodor or signs of infection.  Assessment: Chronic ulcerations fourth digit right foot sub-first metatarsophalangeal joint left foot.  Plan: Discussed etiology pathology conservative versus surgical therapies. I encouraged her to continue the use of tennis shoes even in the house with deeper toe boxes and elongated shoes. I debrided all reactive hyperkeratoses and ulcers today to bleeding and I encouraged her daughter to continue dressing changes on a daily basis area follow up with me should she develop signs and symptoms of worsening infection notify me with any questions or concerns.  Arbutus Ped DPM

## 2015-04-24 ENCOUNTER — Other Ambulatory Visit: Payer: Self-pay | Admitting: Podiatry

## 2015-05-22 ENCOUNTER — Ambulatory Visit (INDEPENDENT_AMBULATORY_CARE_PROVIDER_SITE_OTHER): Payer: Medicare Other | Admitting: Podiatry

## 2015-05-22 ENCOUNTER — Encounter: Payer: Self-pay | Admitting: Podiatry

## 2015-05-22 VITALS — BP 161/101 | HR 75 | Resp 16

## 2015-05-22 DIAGNOSIS — L89891 Pressure ulcer of other site, stage 1: Secondary | ICD-10-CM

## 2015-05-22 DIAGNOSIS — L97511 Non-pressure chronic ulcer of other part of right foot limited to breakdown of skin: Secondary | ICD-10-CM

## 2015-05-22 DIAGNOSIS — L97521 Non-pressure chronic ulcer of other part of left foot limited to breakdown of skin: Secondary | ICD-10-CM

## 2015-05-22 MED ORDER — DOXYCYCLINE HYCLATE 100 MG PO TABS: 100.0000 mg | ORAL_TABLET | Freq: Two times a day (BID) | ORAL | Status: DC

## 2015-05-22 NOTE — Progress Notes (Signed)
She presents today chief complaint of a painful ulceration fourth digit of the right foot. She's also complaining of painful lesion sub-first metatarsophalangeal joint of the left foot. She denies fever chills nausea vomiting muscle aches and pains.  Objective: Vital signs demonstrates significant hypertension today and I recommended that she follow-up with her primary care provider. Otherwise pulses are palpable bilateral. Distal clavus fourth digit right foot once debrided demonstrates ulceration with malodor. No cellulitis no drainage. This does not probe to bone. Attention was directed to the sub-first metatarsophalangeal joint of the left foot which demonstrates a superficial skin breakdown with no odor and no cellulitis.  Assessment: Chronic ulceration fourth toe right foot distal clavus with localized infection. Ulceration sub-first metatarsophalangeal joint left foot.  Plan: Debrided these areas today sharply with a prescription for doxycycline for her right foot. She will soak twice daily and apply Bactroban ointment and I will follow-up with her in a couple weeks. I placed padding to the contralateral foot and recommended padding.

## 2015-05-24 ENCOUNTER — Ambulatory Visit
Admission: RE | Admit: 2015-05-24 | Discharge: 2015-05-24 | Disposition: A | Discharge status: Home / Self Care | Payer: Medicare Other | Source: Ambulatory Visit | Attending: Gastroenterology | Admitting: Gastroenterology

## 2015-05-24 ENCOUNTER — Encounter
Admission: RE | Disposition: A | Discharge status: Home / Self Care | Payer: Self-pay | Source: Ambulatory Visit | Attending: Gastroenterology

## 2015-05-24 DIAGNOSIS — H9190 Unspecified hearing loss, unspecified ear: Secondary | ICD-10-CM | POA: Diagnosis not present

## 2015-05-24 DIAGNOSIS — Z9842 Cataract extraction status, left eye: Secondary | ICD-10-CM | POA: Diagnosis not present

## 2015-05-24 DIAGNOSIS — R1310 Dysphagia, unspecified: Secondary | ICD-10-CM | POA: Insufficient documentation

## 2015-05-24 DIAGNOSIS — M1990 Unspecified osteoarthritis, unspecified site: Secondary | ICD-10-CM | POA: Insufficient documentation

## 2015-05-24 DIAGNOSIS — Z8673 Personal history of transient ischemic attack (TIA), and cerebral infarction without residual deficits: Secondary | ICD-10-CM | POA: Diagnosis not present

## 2015-05-24 DIAGNOSIS — I10 Essential (primary) hypertension: Secondary | ICD-10-CM | POA: Insufficient documentation

## 2015-05-24 DIAGNOSIS — Z79899 Other long term (current) drug therapy: Secondary | ICD-10-CM | POA: Insufficient documentation

## 2015-05-24 DIAGNOSIS — Z7982 Long term (current) use of aspirin: Secondary | ICD-10-CM | POA: Diagnosis not present

## 2015-05-24 DIAGNOSIS — R011 Cardiac murmur, unspecified: Secondary | ICD-10-CM | POA: Insufficient documentation

## 2015-05-24 DIAGNOSIS — Z9049 Acquired absence of other specified parts of digestive tract: Secondary | ICD-10-CM | POA: Diagnosis not present

## 2015-05-24 DIAGNOSIS — K219 Gastro-esophageal reflux disease without esophagitis: Secondary | ICD-10-CM | POA: Diagnosis not present

## 2015-05-24 DIAGNOSIS — N289 Disorder of kidney and ureter, unspecified: Secondary | ICD-10-CM | POA: Insufficient documentation

## 2015-05-24 DIAGNOSIS — Z9841 Cataract extraction status, right eye: Secondary | ICD-10-CM | POA: Diagnosis not present

## 2015-05-24 DIAGNOSIS — E119 Type 2 diabetes mellitus without complications: Secondary | ICD-10-CM | POA: Diagnosis not present

## 2015-05-24 DIAGNOSIS — Z89421 Acquired absence of other right toe(s): Secondary | ICD-10-CM | POA: Insufficient documentation

## 2015-05-24 DIAGNOSIS — M109 Gout, unspecified: Secondary | ICD-10-CM | POA: Diagnosis not present

## 2015-05-24 DIAGNOSIS — E7800 Pure hypercholesterolemia, unspecified: Secondary | ICD-10-CM | POA: Diagnosis not present

## 2015-05-24 DIAGNOSIS — M069 Rheumatoid arthritis, unspecified: Secondary | ICD-10-CM | POA: Diagnosis not present

## 2015-05-24 DIAGNOSIS — D649 Anemia, unspecified: Secondary | ICD-10-CM | POA: Insufficient documentation

## 2015-05-24 DIAGNOSIS — Z7984 Long term (current) use of oral hypoglycemic drugs: Secondary | ICD-10-CM | POA: Insufficient documentation

## 2015-05-24 HISTORY — PX: ESOPHAGEAL MANOMETRY: SHX5429

## 2015-05-24 SURGERY — MANOMETRY, ESOPHAGUS

## 2015-05-24 MED ORDER — BUTAMBEN-TETRACAINE-BENZOCAINE 2-2-14 % EX AERO
INHALATION_SPRAY | CUTANEOUS | Status: AC
  Administered 2015-05-24: 1 via TOPICAL
  Filled 2015-05-24: qty 20

## 2015-05-24 MED ORDER — LIDOCAINE HCL 2 % EX GEL
1.0000 "application " | Freq: Once | CUTANEOUS | Status: AC
  Administered 2015-05-24: 3

## 2015-05-24 MED ORDER — LIDOCAINE HCL 2 % EX GEL
CUTANEOUS | Status: AC
  Administered 2015-05-24: 3
  Filled 2015-05-24: qty 5

## 2015-05-24 MED ORDER — BUTAMBEN-TETRACAINE-BENZOCAINE 2-2-14 % EX AERO
1.0000 | INHALATION_SPRAY | Freq: Once | CUTANEOUS | Status: AC
  Administered 2015-05-24: 1 via TOPICAL

## 2015-05-24 SURGICAL SUPPLY — 2 items
FACESHIELD LNG OPTICON STERILE (SAFETY) IMPLANT
GLOVE BIO SURGEON STRL SZ8 (GLOVE) ×6 IMPLANT

## 2015-05-26 ENCOUNTER — Encounter: Payer: Self-pay | Admitting: Gastroenterology

## 2015-06-07 ENCOUNTER — Encounter: Payer: Self-pay | Admitting: Podiatry

## 2015-06-07 ENCOUNTER — Ambulatory Visit (INDEPENDENT_AMBULATORY_CARE_PROVIDER_SITE_OTHER): Payer: Medicare Other | Admitting: Podiatry

## 2015-06-07 VITALS — BP 151/76 | HR 81 | Temp 96.7°F | Resp 18

## 2015-06-07 DIAGNOSIS — L97511 Non-pressure chronic ulcer of other part of right foot limited to breakdown of skin: Secondary | ICD-10-CM

## 2015-06-07 DIAGNOSIS — L03031 Cellulitis of right toe: Secondary | ICD-10-CM | POA: Diagnosis not present

## 2015-06-07 DIAGNOSIS — L02611 Cutaneous abscess of right foot: Secondary | ICD-10-CM

## 2015-06-07 MED ORDER — SULFAMETHOXAZOLE-TRIMETHOPRIM 400-80 MG PO TABS
1.0000 | ORAL_TABLET | Freq: Two times a day (BID) | ORAL | Status: DC
Start: 2015-06-07 — End: 2015-07-12

## 2015-06-07 MED ORDER — SILVER SULFADIAZINE 1 % EX CREA: 1.0000 "application " | TOPICAL_CREAM | Freq: Every day | CUTANEOUS | Status: DC

## 2015-06-07 NOTE — Progress Notes (Signed)
She presents today with redness and warmth sitting dorsal aspect of the right foot. The daughter states that I don't know where went wrong with something wrong with this toe that she refers to the fourth digit of the right foot. Trish denies fever chills nausea vomiting muscle aches and pains.  Objective: Pulses are barely palpable right foot. Fourth digit of the right foot appears to be dusky with a dry eschar and chronic ulceration. The dorsal aspect of the foot is bright red with cellulitis extending to the level of the ankle.  Assessment: Peripheral vascular disease with chronic ulceration fourth digit right foot resulting in cellulitis and skin breakdown.  Plan: Debrided all necrotic tissue today to bleeding and there was a fair amount of bleeding. However is noted that the bleeding stopped relatively quickly. I recommended she start antibiotics Bactrim DS try to get to them in her today for the next 10 days. I also marked the superior margins of the cellulitis and expressed my concern regarding worsening of the cellulitis and the fact that she should go to the hospital should this happen. I also recommended that they follow up with Dr. Festus Barren for vascular evaluation. I wrote a prescription not only for the Bactrim but also for Silvadene cream 1% she will apply this after soaking and cleaning the toe daily with a very light loose bandage. I will follow up with her in 1-2 weeks to make sure she is doing well. I did express to the daughter my concern that this may need to go on for amputation.

## 2015-06-21 ENCOUNTER — Encounter: Payer: Self-pay | Admitting: Podiatry

## 2015-06-21 ENCOUNTER — Ambulatory Visit (INDEPENDENT_AMBULATORY_CARE_PROVIDER_SITE_OTHER): Payer: Medicare Other | Admitting: Podiatry

## 2015-06-21 VITALS — BP 161/89 | HR 92 | Resp 18

## 2015-06-21 DIAGNOSIS — L89891 Pressure ulcer of other site, stage 1: Secondary | ICD-10-CM

## 2015-06-21 DIAGNOSIS — L97511 Non-pressure chronic ulcer of other part of right foot limited to breakdown of skin: Secondary | ICD-10-CM

## 2015-06-21 NOTE — Progress Notes (Signed)
She presents today with chief complaint ulceration fourth digit left foot. She states this seems to be doing better. She has completed her Bactrim DS and her daughter continues to wrap dressings daily basis with Silvadene cream.  Objective: Signs are stable she is alert and oriented 3 in no apparent distress. Pulses remain palpable right. Fourth toe right foot appears to be doing much better as far as dry her loose and currently there is no open wound. No signs of cellulitis no edema. I debrided all reactive hyperkeratosis of the ulceration.  Assessment: Healing ulceration fourth digit right foot.  Plan: Continue all conservative therapies after debridement today dressed dressed breast dressing follow-up with me in 3 weeks. May continue Bactrim for a while.

## 2015-06-26 ENCOUNTER — Ambulatory Visit: Payer: Medicare Other | Admitting: Podiatry

## 2015-07-12 ENCOUNTER — Encounter: Payer: Self-pay | Admitting: Podiatry

## 2015-07-12 ENCOUNTER — Ambulatory Visit (INDEPENDENT_AMBULATORY_CARE_PROVIDER_SITE_OTHER): Payer: Medicare Other | Admitting: Podiatry

## 2015-07-12 DIAGNOSIS — L97511 Non-pressure chronic ulcer of other part of right foot limited to breakdown of skin: Secondary | ICD-10-CM | POA: Diagnosis not present

## 2015-07-12 DIAGNOSIS — M79675 Pain in left toe(s): Secondary | ICD-10-CM

## 2015-07-12 DIAGNOSIS — M79674 Pain in right toe(s): Secondary | ICD-10-CM | POA: Diagnosis not present

## 2015-07-12 DIAGNOSIS — B351 Tinea unguium: Secondary | ICD-10-CM | POA: Diagnosis not present

## 2015-07-12 NOTE — Progress Notes (Signed)
She presents today to complaint of painful elongated toenails as well as a follow-up for ulceration third digit right foot.  Objective: Vital signs are stable she is alert and oriented 3. Pulses are palpable. Nails are thick yellow dystrophic with mycotic and painful palpation. The wound to the distal aspect of the third toe has gone heal uneventfully.  Assessment: Well-healing ulceration third toe right foot. Pain in limb secondary onychomycosis.  Plan: Debrided ulceration today and debrided nails 1 through 5 bilateral. Follow up with her in 3 weeks.

## 2015-08-02 ENCOUNTER — Ambulatory Visit (INDEPENDENT_AMBULATORY_CARE_PROVIDER_SITE_OTHER): Payer: Medicare Other | Admitting: Podiatry

## 2015-08-02 ENCOUNTER — Encounter: Payer: Self-pay | Admitting: Podiatry

## 2015-08-02 VITALS — BP 150/81 | HR 90 | Resp 18

## 2015-08-02 DIAGNOSIS — L89891 Pressure ulcer of other site, stage 1: Secondary | ICD-10-CM | POA: Diagnosis not present

## 2015-08-02 DIAGNOSIS — L97511 Non-pressure chronic ulcer of other part of right foot limited to breakdown of skin: Secondary | ICD-10-CM

## 2015-08-02 NOTE — Progress Notes (Signed)
She presents today for follow-up of ulceration third digit right foot. She states it is doing much better.  Objective: Vital signs stable she is alert and oriented 3 pulses remain palpable right foot. Digit does demonstrate some mild reactive hyperkeratosis but once debrided does not demonstrate any signs of ulceration.  Assessment: Healing ulceration.  Plan: Follow up with me 1 month.

## 2015-08-30 ENCOUNTER — Encounter: Payer: Self-pay | Admitting: Podiatry

## 2015-08-30 ENCOUNTER — Ambulatory Visit (INDEPENDENT_AMBULATORY_CARE_PROVIDER_SITE_OTHER): Payer: Medicare Other | Admitting: Podiatry

## 2015-08-30 VITALS — BP 148/76 | HR 89 | Resp 16

## 2015-08-30 DIAGNOSIS — M069 Rheumatoid arthritis, unspecified: Secondary | ICD-10-CM | POA: Insufficient documentation

## 2015-08-30 DIAGNOSIS — M1990 Unspecified osteoarthritis, unspecified site: Secondary | ICD-10-CM | POA: Insufficient documentation

## 2015-08-30 DIAGNOSIS — D649 Anemia, unspecified: Secondary | ICD-10-CM | POA: Insufficient documentation

## 2015-08-30 DIAGNOSIS — L97511 Non-pressure chronic ulcer of other part of right foot limited to breakdown of skin: Secondary | ICD-10-CM

## 2015-08-30 DIAGNOSIS — N184 Chronic kidney disease, stage 4 (severe): Secondary | ICD-10-CM | POA: Insufficient documentation

## 2015-08-30 NOTE — Progress Notes (Signed)
She presents today for follow-up of ulceration fourth toe right foot. She states this seems to be doing well. She states that her daughter addresses the toe every day.  Objective: Vital signs are stable alert and oriented 3. Pulses are palpable. Hammertoe deformity right foot amputation third digit right distal clavus to the fourth digit right is not in a straight any signs of ulceration today. No signs of infection.  Assessment: Well-healing chronic ulceration fourth toe right foot.  Plan: I would continue to dress it every day because more than likely if the dressing this causing the toe to not break down. I debrided the rectum hyperkeratosis for her today follow-up with her in 6 weeks for nail trim.

## 2015-10-11 ENCOUNTER — Encounter: Payer: Self-pay | Admitting: Podiatry

## 2015-10-11 ENCOUNTER — Ambulatory Visit (INDEPENDENT_AMBULATORY_CARE_PROVIDER_SITE_OTHER): Payer: Medicare Other | Admitting: Podiatry

## 2015-10-11 DIAGNOSIS — B351 Tinea unguium: Secondary | ICD-10-CM

## 2015-10-11 DIAGNOSIS — M79609 Pain in unspecified limb: Secondary | ICD-10-CM

## 2015-10-11 DIAGNOSIS — L97401 Non-pressure chronic ulcer of unspecified heel and midfoot limited to breakdown of skin: Secondary | ICD-10-CM

## 2015-10-11 DIAGNOSIS — L97511 Non-pressure chronic ulcer of other part of right foot limited to breakdown of skin: Secondary | ICD-10-CM

## 2015-10-11 DIAGNOSIS — L89891 Pressure ulcer of other site, stage 1: Secondary | ICD-10-CM | POA: Diagnosis not present

## 2015-10-11 DIAGNOSIS — M79673 Pain in unspecified foot: Secondary | ICD-10-CM | POA: Diagnosis not present

## 2015-10-11 NOTE — Progress Notes (Signed)
She presents today for a follow-up of painful elongated toenails and ulceration fourth digit right foot. She states that all and is doing well however she has some tenderness on her heels.  Objective: Vital signs are stable alert and oriented 3 toenails are long thick yellow dystrophic onychomycotic and no open wounds to the forefoot noted. She does have some maceration to the heels bilaterally the left one being most sore there is no signs of infection but there does appear to be some soft tissue breakdown secondary to possible incontinence.  Assessment: Pain limb secondary to onychomycosis. Well-healing ulceration toes. Slight ulceration maceration bilateral heels. New diagnosis.  Plan: Discussed soaking in Epsom salts and warm water bilateral heels. We debrided the nails and calluses today for her. Follow up with her in 4 weeks

## 2015-11-08 ENCOUNTER — Encounter: Payer: Self-pay | Admitting: Podiatry

## 2015-11-08 ENCOUNTER — Ambulatory Visit: Payer: Medicare Other | Admitting: Podiatry

## 2015-11-08 ENCOUNTER — Ambulatory Visit (INDEPENDENT_AMBULATORY_CARE_PROVIDER_SITE_OTHER): Payer: Medicare Other | Admitting: Podiatry

## 2015-11-08 VITALS — BP 128/66 | HR 94 | Resp 16

## 2015-11-08 DIAGNOSIS — L89891 Pressure ulcer of other site, stage 1: Secondary | ICD-10-CM

## 2015-11-08 DIAGNOSIS — L97511 Non-pressure chronic ulcer of other part of right foot limited to breakdown of skin: Secondary | ICD-10-CM

## 2015-11-08 DIAGNOSIS — L97401 Non-pressure chronic ulcer of unspecified heel and midfoot limited to breakdown of skin: Secondary | ICD-10-CM

## 2015-11-08 NOTE — Progress Notes (Signed)
She presents today for follow-up of ulceration fourth digit right foot in the bilateral heels. She states that everything is healed up except for the left heel.  Objective: Vital signs are stable alert and oriented 3 there is no erythema cellulitis drainage or odor just a small area of maceration to the plantar lateral heel. No erythema saline drainage or odor reactive hyperkeratotic tissue is noted. Once debrided no signs of infection.  Assessment: Superficial ulceration left heel.  Plan: Redress today dressing compressive dressing encouraged soaking and Bactroban ointment I will follow-up with her in 3-4 weeks.

## 2015-11-21 ENCOUNTER — Emergency Department: Payer: Medicare Other

## 2015-11-21 ENCOUNTER — Inpatient Hospital Stay
Admission: EM | Admit: 2015-11-21 | Discharge: 2015-11-26 | DRG: 871 | Disposition: A | Discharge status: Home Health | Payer: Medicare Other | Attending: Internal Medicine | Admitting: Internal Medicine

## 2015-11-21 DIAGNOSIS — E7800 Pure hypercholesterolemia, unspecified: Secondary | ICD-10-CM | POA: Diagnosis present

## 2015-11-21 DIAGNOSIS — E876 Hypokalemia: Secondary | ICD-10-CM | POA: Diagnosis not present

## 2015-11-21 DIAGNOSIS — R0989 Other specified symptoms and signs involving the circulatory and respiratory systems: Secondary | ICD-10-CM

## 2015-11-21 DIAGNOSIS — N184 Chronic kidney disease, stage 4 (severe): Secondary | ICD-10-CM | POA: Diagnosis present

## 2015-11-21 DIAGNOSIS — K224 Dyskinesia of esophagus: Secondary | ICD-10-CM | POA: Diagnosis present

## 2015-11-21 DIAGNOSIS — Z886 Allergy status to analgesic agent status: Secondary | ICD-10-CM

## 2015-11-21 DIAGNOSIS — E8770 Fluid overload, unspecified: Secondary | ICD-10-CM | POA: Diagnosis present

## 2015-11-21 DIAGNOSIS — Z8249 Family history of ischemic heart disease and other diseases of the circulatory system: Secondary | ICD-10-CM

## 2015-11-21 DIAGNOSIS — Z79899 Other long term (current) drug therapy: Secondary | ICD-10-CM

## 2015-11-21 DIAGNOSIS — M419 Scoliosis, unspecified: Secondary | ICD-10-CM | POA: Diagnosis present

## 2015-11-21 DIAGNOSIS — I214 Non-ST elevation (NSTEMI) myocardial infarction: Secondary | ICD-10-CM | POA: Diagnosis present

## 2015-11-21 DIAGNOSIS — M1990 Unspecified osteoarthritis, unspecified site: Secondary | ICD-10-CM | POA: Diagnosis present

## 2015-11-21 DIAGNOSIS — K5900 Constipation, unspecified: Secondary | ICD-10-CM | POA: Diagnosis present

## 2015-11-21 DIAGNOSIS — D638 Anemia in other chronic diseases classified elsewhere: Secondary | ICD-10-CM | POA: Diagnosis present

## 2015-11-21 DIAGNOSIS — I248 Other forms of acute ischemic heart disease: Secondary | ICD-10-CM | POA: Diagnosis present

## 2015-11-21 DIAGNOSIS — Z7984 Long term (current) use of oral hypoglycemic drugs: Secondary | ICD-10-CM

## 2015-11-21 DIAGNOSIS — E1122 Type 2 diabetes mellitus with diabetic chronic kidney disease: Secondary | ICD-10-CM | POA: Diagnosis present

## 2015-11-21 DIAGNOSIS — A419 Sepsis, unspecified organism: Secondary | ICD-10-CM | POA: Diagnosis present

## 2015-11-21 DIAGNOSIS — M069 Rheumatoid arthritis, unspecified: Secondary | ICD-10-CM | POA: Diagnosis present

## 2015-11-21 DIAGNOSIS — N390 Urinary tract infection, site not specified: Secondary | ICD-10-CM | POA: Diagnosis present

## 2015-11-21 DIAGNOSIS — I4891 Unspecified atrial fibrillation: Secondary | ICD-10-CM | POA: Diagnosis present

## 2015-11-21 DIAGNOSIS — Z8673 Personal history of transient ischemic attack (TIA), and cerebral infarction without residual deficits: Secondary | ICD-10-CM

## 2015-11-21 DIAGNOSIS — Z881 Allergy status to other antibiotic agents status: Secondary | ICD-10-CM

## 2015-11-21 DIAGNOSIS — I350 Nonrheumatic aortic (valve) stenosis: Secondary | ICD-10-CM | POA: Diagnosis present

## 2015-11-21 DIAGNOSIS — I10 Essential (primary) hypertension: Secondary | ICD-10-CM | POA: Diagnosis present

## 2015-11-21 DIAGNOSIS — Z888 Allergy status to other drugs, medicaments and biological substances status: Secondary | ICD-10-CM | POA: Diagnosis not present

## 2015-11-21 DIAGNOSIS — I219 Acute myocardial infarction, unspecified: Secondary | ICD-10-CM | POA: Diagnosis present

## 2015-11-21 DIAGNOSIS — K219 Gastro-esophageal reflux disease without esophagitis: Secondary | ICD-10-CM | POA: Diagnosis present

## 2015-11-21 DIAGNOSIS — Z7982 Long term (current) use of aspirin: Secondary | ICD-10-CM

## 2015-11-21 DIAGNOSIS — Z880 Allergy status to penicillin: Secondary | ICD-10-CM

## 2015-11-21 DIAGNOSIS — M109 Gout, unspecified: Secondary | ICD-10-CM | POA: Diagnosis present

## 2015-11-21 DIAGNOSIS — I129 Hypertensive chronic kidney disease with stage 1 through stage 4 chronic kidney disease, or unspecified chronic kidney disease: Secondary | ICD-10-CM | POA: Diagnosis present

## 2015-11-21 DIAGNOSIS — Z803 Family history of malignant neoplasm of breast: Secondary | ICD-10-CM | POA: Diagnosis not present

## 2015-11-21 DIAGNOSIS — E785 Hyperlipidemia, unspecified: Secondary | ICD-10-CM | POA: Diagnosis present

## 2015-11-21 DIAGNOSIS — I213 ST elevation (STEMI) myocardial infarction of unspecified site: Secondary | ICD-10-CM | POA: Diagnosis not present

## 2015-11-21 DIAGNOSIS — I509 Heart failure, unspecified: Secondary | ICD-10-CM | POA: Diagnosis not present

## 2015-11-21 HISTORY — DX: Hyperlipidemia, unspecified: E78.5

## 2015-11-21 HISTORY — DX: Chronic kidney disease, stage 4 (severe): N18.4

## 2015-11-21 HISTORY — DX: Unspecified atrial fibrillation: I48.91

## 2015-11-21 HISTORY — DX: Transient cerebral ischemic attack, unspecified: G45.9

## 2015-11-21 HISTORY — DX: Essential (primary) hypertension: I10

## 2015-11-21 LAB — CBC WITH DIFFERENTIAL/PLATELET
BASOS ABS: 0.1 10*3/uL (ref 0–0.1)
Basophils Relative: 1 %
EOS ABS: 0.1 10*3/uL (ref 0–0.7)
EOS PCT: 1 %
HCT: 30.5 % — ABNORMAL LOW (ref 35.0–47.0)
Hemoglobin: 9.9 g/dL — ABNORMAL LOW (ref 12.0–16.0)
Lymphocytes Relative: 7 %
Lymphs Abs: 0.4 10*3/uL — ABNORMAL LOW (ref 1.0–3.6)
MCH: 34.6 pg — AB (ref 26.0–34.0)
MCHC: 32.4 g/dL (ref 32.0–36.0)
MCV: 106.8 fL — ABNORMAL HIGH (ref 80.0–100.0)
MONO ABS: 0.6 10*3/uL (ref 0.2–0.9)
Monocytes Relative: 11 %
Neutro Abs: 4.4 10*3/uL (ref 1.4–6.5)
Neutrophils Relative %: 80 %
PLATELETS: 280 10*3/uL (ref 150–440)
RBC: 2.86 MIL/uL — AB (ref 3.80–5.20)
RDW: 20.2 % — AB (ref 11.5–14.5)
WBC: 5.5 10*3/uL (ref 3.6–11.0)

## 2015-11-21 LAB — URINALYSIS COMPLETE WITH MICROSCOPIC (ARMC ONLY)
BILIRUBIN URINE: NEGATIVE
GLUCOSE, UA: NEGATIVE mg/dL
Hgb urine dipstick: NEGATIVE
Ketones, ur: NEGATIVE mg/dL
NITRITE: NEGATIVE
Protein, ur: 100 mg/dL — AB
Specific Gravity, Urine: 1.016 (ref 1.005–1.030)
Squamous Epithelial / LPF: NONE SEEN
Trans Epithel, UA: 2
pH: 5 (ref 5.0–8.0)

## 2015-11-21 LAB — BRAIN NATRIURETIC PEPTIDE: B Natriuretic Peptide: 226 pg/mL — ABNORMAL HIGH (ref 0.0–100.0)

## 2015-11-21 LAB — COMPREHENSIVE METABOLIC PANEL
ALK PHOS: 113 U/L (ref 38–126)
ALT: 20 U/L (ref 14–54)
ANION GAP: 10 (ref 5–15)
AST: 25 U/L (ref 15–41)
Albumin: 3.6 g/dL (ref 3.5–5.0)
BILIRUBIN TOTAL: 0.3 mg/dL (ref 0.3–1.2)
BUN: 29 mg/dL — ABNORMAL HIGH (ref 6–20)
CALCIUM: 9 mg/dL (ref 8.9–10.3)
CO2: 22 mmol/L (ref 22–32)
CREATININE: 1.72 mg/dL — AB (ref 0.44–1.00)
Chloride: 107 mmol/L (ref 101–111)
GFR, EST AFRICAN AMERICAN: 29 mL/min — AB (ref >60)
GFR, EST NON AFRICAN AMERICAN: 25 mL/min — AB (ref >60)
Glucose, Bld: 167 mg/dL — ABNORMAL HIGH (ref 65–99)
Potassium: 4.5 mmol/L (ref 3.5–5.1)
Sodium: 139 mmol/L (ref 135–145)
TOTAL PROTEIN: 6.9 g/dL (ref 6.5–8.1)

## 2015-11-21 LAB — PROTIME-INR
INR: 1.03
Prothrombin Time: 13.7 seconds (ref 11.4–15.0)

## 2015-11-21 LAB — APTT: APTT: 27 s (ref 24–36)

## 2015-11-21 LAB — TROPONIN I: TROPONIN I: 0.03 ng/mL (ref <0.031)

## 2015-11-21 LAB — LACTIC ACID, PLASMA: Lactic Acid, Venous: 3.1 mmol/L (ref 0.5–2.0)

## 2015-11-21 MED ORDER — ACETAMINOPHEN 325 MG PO TABS
650.0000 mg | ORAL_TABLET | Freq: Four times a day (QID) | ORAL | Status: DC | PRN
  Administered 2015-11-22 – 2015-11-23 (×3): 650 mg via ORAL
  Filled 2015-11-21 (×4): qty 2

## 2015-11-21 MED ORDER — VANCOMYCIN HCL IN DEXTROSE 1-5 GM/200ML-% IV SOLN
1000.0000 mg | Freq: Once | INTRAVENOUS | Status: AC
  Administered 2015-11-21: 1000 mg via INTRAVENOUS
  Filled 2015-11-21: qty 200

## 2015-11-21 MED ORDER — ACETAMINOPHEN 650 MG RE SUPP
650.0000 mg | Freq: Once | RECTAL | Status: AC
  Administered 2015-11-21: 650 mg via RECTAL

## 2015-11-21 MED ORDER — VITAMIN C 500 MG PO TABS
500.0000 mg | ORAL_TABLET | Freq: Every day | ORAL | Status: DC
  Administered 2015-11-22 – 2015-11-26 (×5): 500 mg via ORAL
  Filled 2015-11-21 (×5): qty 1

## 2015-11-21 MED ORDER — LOSARTAN POTASSIUM 25 MG PO TABS
100.0000 mg | ORAL_TABLET | Freq: Every day | ORAL | Status: DC
  Filled 2015-11-21: qty 4

## 2015-11-21 MED ORDER — SODIUM CHLORIDE 0.9% FLUSH
3.0000 mL | Freq: Two times a day (BID) | INTRAVENOUS | Status: DC
  Administered 2015-11-21 – 2015-11-26 (×9): 3 mL via INTRAVENOUS

## 2015-11-21 MED ORDER — VITAMIN B-12 1000 MCG PO TABS
1000.0000 ug | ORAL_TABLET | Freq: Every day | ORAL | Status: DC
  Administered 2015-11-22 – 2015-11-26 (×5): 1000 ug via ORAL
  Filled 2015-11-21 (×5): qty 1

## 2015-11-21 MED ORDER — LACTULOSE 10 GM/15ML PO SOLN
20.0000 g | Freq: Two times a day (BID) | ORAL | Status: DC
  Administered 2015-11-22 – 2015-11-26 (×10): 20 g via ORAL
  Filled 2015-11-21 (×10): qty 30

## 2015-11-21 MED ORDER — SODIUM CHLORIDE 0.9 % IV BOLUS (SEPSIS)
1000.0000 mL | Freq: Once | INTRAVENOUS | Status: AC
  Administered 2015-11-21: 1000 mL via INTRAVENOUS

## 2015-11-21 MED ORDER — PANTOPRAZOLE SODIUM 40 MG PO TBEC
40.0000 mg | DELAYED_RELEASE_TABLET | Freq: Every day | ORAL | Status: DC
  Administered 2015-11-22 – 2015-11-26 (×5): 40 mg via ORAL
  Filled 2015-11-21 (×5): qty 1

## 2015-11-21 MED ORDER — HYDROXYCHLOROQUINE SULFATE 200 MG PO TABS
200.0000 mg | ORAL_TABLET | Freq: Every day | ORAL | Status: DC
  Administered 2015-11-22 – 2015-11-26 (×5): 200 mg via ORAL
  Filled 2015-11-21 (×5): qty 1

## 2015-11-21 MED ORDER — ACETAMINOPHEN 650 MG RE SUPP
RECTAL | Status: AC
  Administered 2015-11-21: 650 mg via RECTAL
  Filled 2015-11-21: qty 1

## 2015-11-21 MED ORDER — SIMVASTATIN 40 MG PO TABS
20.0000 mg | ORAL_TABLET | Freq: Every day | ORAL | Status: DC
  Administered 2015-11-22 – 2015-11-25 (×5): 20 mg via ORAL
  Filled 2015-11-21 (×4): qty 1

## 2015-11-21 MED ORDER — GLUCOSAMINE-CHONDROITIN 500-400 MG PO TABS: 1.0000 | ORAL_TABLET | Freq: Every day | ORAL | Status: DC

## 2015-11-21 MED ORDER — METOPROLOL TARTRATE 5 MG/5ML IV SOLN: 5.0000 mg | Freq: Once | INTRAVENOUS | Status: DC

## 2015-11-21 MED ORDER — SODIUM CHLORIDE 0.9 % IV SOLN
INTRAVENOUS | Status: DC
  Administered 2015-11-21: 1000 mL via INTRAVENOUS
  Administered 2015-11-22: 11:00:00 via INTRAVENOUS

## 2015-11-21 MED ORDER — HYDRALAZINE HCL 25 MG PO TABS
25.0000 mg | ORAL_TABLET | Freq: Three times a day (TID) | ORAL | Status: DC
  Administered 2015-11-22 (×2): 25 mg via ORAL
  Filled 2015-11-21 (×2): qty 1

## 2015-11-21 MED ORDER — HEPARIN SODIUM (PORCINE) 5000 UNIT/ML IJ SOLN
5000.0000 [IU] | Freq: Three times a day (TID) | INTRAMUSCULAR | Status: DC
  Administered 2015-11-22 (×2): 5000 [IU] via SUBCUTANEOUS
  Filled 2015-11-21 (×2): qty 1

## 2015-11-21 MED ORDER — ISOSORBIDE DINITRATE 10 MG PO TABS
10.0000 mg | ORAL_TABLET | Freq: Three times a day (TID) | ORAL | Status: DC
  Administered 2015-11-22 – 2015-11-25 (×10): 10 mg via ORAL
  Filled 2015-11-21 (×10): qty 1

## 2015-11-21 MED ORDER — ROPINIROLE HCL 1 MG PO TABS
0.5000 mg | ORAL_TABLET | Freq: Every day | ORAL | Status: DC
  Administered 2015-11-22 – 2015-11-25 (×5): 0.5 mg via ORAL
  Filled 2015-11-21 (×5): qty 1

## 2015-11-21 MED ORDER — SODIUM CHLORIDE 0.9 % IV BOLUS (SEPSIS)
394.0000 mL | Freq: Once | INTRAVENOUS | Status: AC
  Administered 2015-11-21: 394 mL via INTRAVENOUS

## 2015-11-21 MED ORDER — DEXTROSE 5 % IV SOLN
1.0000 g | Freq: Three times a day (TID) | INTRAVENOUS | Status: DC
  Administered 2015-11-22 – 2015-11-24 (×8): 1 g via INTRAVENOUS
  Filled 2015-11-21 (×9): qty 1

## 2015-11-21 MED ORDER — ACETAMINOPHEN 325 MG PO TABS: 650.0000 mg | ORAL_TABLET | Freq: Once | ORAL | Status: DC

## 2015-11-21 MED ORDER — FERROUS SULFATE 325 (65 FE) MG PO TABS
325.0000 mg | ORAL_TABLET | Freq: Every day | ORAL | Status: DC
  Administered 2015-11-22 – 2015-11-26 (×5): 325 mg via ORAL
  Filled 2015-11-21 (×5): qty 1

## 2015-11-21 MED ORDER — GABAPENTIN 600 MG PO TABS
600.0000 mg | ORAL_TABLET | Freq: Three times a day (TID) | ORAL | Status: DC
  Administered 2015-11-22 – 2015-11-26 (×14): 600 mg via ORAL
  Filled 2015-11-21 (×14): qty 1

## 2015-11-21 MED ORDER — OMEGA-3-ACID ETHYL ESTERS 1 G PO CAPS
1.0000 g | ORAL_CAPSULE | Freq: Two times a day (BID) | ORAL | Status: DC
  Administered 2015-11-22 – 2015-11-26 (×10): 1 g via ORAL
  Filled 2015-11-21 (×10): qty 1

## 2015-11-21 MED ORDER — DILTIAZEM HCL 25 MG/5ML IV SOLN
10.0000 mg | Freq: Once | INTRAVENOUS | Status: AC
  Administered 2015-11-21: 10 mg via INTRAVENOUS
  Filled 2015-11-21: qty 5

## 2015-11-21 MED ORDER — DEXTROSE 5 % IV SOLN
2.0000 g | Freq: Once | INTRAVENOUS | Status: AC
  Administered 2015-11-21: 2 g via INTRAVENOUS
  Filled 2015-11-21: qty 2

## 2015-11-21 MED ORDER — ASPIRIN EC 81 MG PO TBEC
81.0000 mg | DELAYED_RELEASE_TABLET | Freq: Every day | ORAL | Status: DC
  Administered 2015-11-22 – 2015-11-24 (×3): 81 mg via ORAL
  Filled 2015-11-21 (×3): qty 1

## 2015-11-21 MED ORDER — INSULIN ASPART 100 UNIT/ML ~~LOC~~ SOLN
0.0000 [IU] | Freq: Three times a day (TID) | SUBCUTANEOUS | Status: DC
  Administered 2015-11-22 – 2015-11-23 (×2): 1 [IU] via SUBCUTANEOUS
  Administered 2015-11-23: 2 [IU] via SUBCUTANEOUS
  Administered 2015-11-23: 3 [IU] via SUBCUTANEOUS
  Administered 2015-11-24 (×2): 1 [IU] via SUBCUTANEOUS
  Administered 2015-11-24: 2 [IU] via SUBCUTANEOUS
  Administered 2015-11-25: 1 [IU] via SUBCUTANEOUS
  Administered 2015-11-25 (×2): 2 [IU] via SUBCUTANEOUS
  Filled 2015-11-21: qty 1
  Filled 2015-11-21: qty 2
  Filled 2015-11-21: qty 1
  Filled 2015-11-21: qty 2
  Filled 2015-11-21: qty 1
  Filled 2015-11-21 (×2): qty 2
  Filled 2015-11-21: qty 1
  Filled 2015-11-21: qty 2
  Filled 2015-11-21: qty 3

## 2015-11-21 MED ORDER — ALLOPURINOL 100 MG PO TABS
100.0000 mg | ORAL_TABLET | Freq: Two times a day (BID) | ORAL | Status: DC
  Administered 2015-11-22 – 2015-11-26 (×10): 100 mg via ORAL
  Filled 2015-11-21 (×10): qty 1

## 2015-11-21 MED ORDER — CLONIDINE HCL 0.1 MG PO TABS
0.2000 mg | ORAL_TABLET | Freq: Two times a day (BID) | ORAL | Status: DC
  Administered 2015-11-22: 0.2 mg via ORAL
  Filled 2015-11-21 (×2): qty 2

## 2015-11-21 MED ORDER — VITAMIN D 1000 UNITS PO TABS
1000.0000 [IU] | ORAL_TABLET | Freq: Every day | ORAL | Status: DC
  Administered 2015-11-22 – 2015-11-26 (×5): 1000 [IU] via ORAL
  Filled 2015-11-21 (×5): qty 1

## 2015-11-21 NOTE — Progress Notes (Signed)
PHARMACIST - PHYSICIAN ORDER COMMUNICATION  CONCERNING: P&T Medication Policy on Herbal Medications  DESCRIPTION:  This patient's order for: glucosamine/chondroitin  has been noted.  This product(s) is classified as an "herbal" or natural product. Due to a lack of definitive safety studies or FDA approval, nonstandard manufacturing practices, plus the potential risk of unknown drug-drug interactions while on inpatient medications, the Pharmacy and Therapeutics Committee does not permit the use of "herbal" or natural products of this type within [facility].   ACTION TAKEN: The pharmacy department is unable to verify this order at this time. Please reevaluate patient's clinical condition at discharge and address if the herbal or natural product(s) should be resumed at that time.

## 2015-11-21 NOTE — H&P (Signed)
[employer] [facility] [facility]   PATIENT NAME: Kelsey Chung    MR#:  [id number]  DATE OF BIRTH:  10-Jul-1923  DATE OF ADMISSION:  11/21/2015  PRIMARY CARE PHYSICIAN: Barbette Reichmann, MD   REQUESTING/REFERRING PHYSICIAN: Inocencio Homes  CHIEF COMPLAINT:   Chief Complaint  Patient presents with  . Weakness  . Code Sepsis    HISTORY OF PRESENT ILLNESS: Kelsey Chung  is a 82 y.o. female with a known history of Diabetes, hypertension, gastroesophageal reflux, osteoarthritis, stroke- lives with her daughter and at baseline able to walk with a can of walker. She does not have any dementia and she is able to take care of herself and do daily activities. Today around dinnertime daughter noted the patient is extremely weak she is very shaky and also had 2 episodes of vomiting. Concerned with this she brought her to emergency room and she was noted to have fever of 103F, with tachycardia and urinary tract infection. Patient is drowsy and all this history is obtained from her daughter was present in the room. As per daughter 1 week ago when she visited her primary care physician's office she had a urinalysis done which showed some infection, and she was given oral Keflex but she had some reaction after 1 day and so they stopped without replacing with any other antibiotic.  PAST MEDICAL HISTORY:   Past Medical History  Diagnosis Date  . Diabetes (HCC)   . HBP (high blood pressure)   . Rheumatoid arthritis(714.0)   . High cholesterol   . Reflux   . Gout   . Osteoarthritis (arthritis due to wear and tear of joints)   . Scoliosis   . Constipation, chronic   . Stroke [facility])     PAST SURGICAL HISTORY: Past Surgical History  Procedure Laterality Date  . Hernia repair    . Gallbladder surgery    . Appendectomy    . Ovarian cyst surgery    . Foot surgery Right   . Eye surgery Bilateral   . Breast cyst excision Left   . Cardiac catheterization    . Colonoscopy    .  Esophagogastroduodenoscopy endoscopy    . Dilation and curettage of uterus    . Esophageal manometry N/A 05/24/2015    Procedure: ESOPHAGEAL MANOMETRY (EM);  Surgeon: Elnita Maxwell, MD;  Location: [facility] ENDOSCOPY;  Service: Endoscopy;  Laterality: N/A;    SOCIAL HISTORY:  Social History  Substance Use Topics  . Smoking status: Never Smoker   . Smokeless tobacco: Never Used  . Alcohol Use: No    FAMILY HISTORY:  Family History  Problem Relation Age of Onset  . Breast cancer Mother   . CAD Father     DRUG ALLERGIES:  Allergies  Allergen Reactions  . Augmentin [Amoxicillin-Pot Clavulanate] Anaphylaxis and Other (See Comments)    Has patient had a PCN reaction causing immediate rash, facial/tongue/throat swelling, SOB or lightheadedness with hypotension: Yes Has patient had a PCN reaction causing severe rash involving mucus membranes or skin necrosis: No Has patient had a PCN reaction that required hospitalization No Has patient had a PCN reaction occurring within the last 10 years: Yes If all of the above answers are "NO", then may proceed with Cephalosporin use.  . Beta Adrenergic Blockers Other (See Comments)    Reaction:  Unknown  . Clindamycin Other (See Comments)    Reaction:  Redness to face/swollen eyes   . Excedrin Extra Strength [Aspirin-Acetaminophen-Caffeine] Diarrhea  . Keflex [Cephalexin] Swelling  and Other (See Comments)    Reaction:  Lip swelling   . Meloxicam Other (See Comments)    Reaction:  Decreased renal function  . Methotrexate Sodium Other (See Comments)    Reaction:  Decreased renal function  . Nsaids Other (See Comments)    Reaction:  Unknown   . Quinine Rash  . Quinolones Rash    REVIEW OF SYSTEMS:   Patient is having some drowsiness currently and so not able to give me any details.  MEDICATIONS AT HOME:  Prior to Admission medications   Medication Sig Start Date End Date Taking? Authorizing Provider  allopurinol (ZYLOPRIM) 100 MG  tablet Take 100 mg by mouth 2 (two) times daily.    Yes Historical Provider, MD  cloNIDine (CATAPRES) 0.2 MG tablet Take 0.2 mg by mouth 2 (two) times daily.    Yes Historical Provider, MD  gabapentin (NEURONTIN) 600 MG tablet Take 600 mg by mouth 3 (three) times daily.   Yes Historical Provider, MD  aspirin 81 MG tablet Take 81 mg by mouth daily.    Historical Provider, MD  Cholecalciferol (VITAMIN D PO) Take by mouth daily.    Historical Provider, MD  cyanocobalamin 1000 MCG tablet Take 100 mcg by mouth daily.    Historical Provider, MD  glipiZIDE (GLUCOTROL) 5 MG tablet  02/16/13   Historical Provider, MD  Glucosamine HCl (GLUCOSAMINE PO) Take by mouth 2 (two) times daily.    Historical Provider, MD  hydrALAZINE (APRESOLINE) 25 MG tablet  03/24/13   Historical Provider, MD  hydrochlorothiazide (HYDRODIURIL) 25 MG tablet  04/20/13   Historical Provider, MD  hydrocortisone 2.5 % cream  08/30/13   Historical Provider, MD  IRON PO Take by mouth daily.    Historical Provider, MD  isosorbide dinitrate (ISORDIL) 10 MG tablet Take by mouth. 07/03/15 07/02/16  Historical Provider, MD  lactulose (CHRONULAC) 10 GM/15ML solution  04/29/13   Historical Provider, MD  losartan (COZAAR) 100 MG tablet  04/13/13   Historical Provider, MD  methotrexate (RHEUMATREX) 2.5 MG tablet  05/12/13   Historical Provider, MD  Omega-3 Fatty Acids (FISH OIL PO) Take by mouth 3 (three) times daily.    Historical Provider, MD  omeprazole (PRILOSEC) 20 MG capsule  04/20/13   Historical Provider, MD  pantoprazole (PROTONIX) 40 MG tablet Take by mouth. 07/03/15   Historical Provider, MD  silver sulfADIAZINE (SILVADENE) 1 % cream Apply 1 application topically daily. 06/07/15   Max T Hyatt, DPM  simvastatin (ZOCOR) 20 MG tablet Take 20 mg by mouth daily.    Historical Provider, MD  traMADol Janean Sark) 50 MG tablet  04/20/13   Historical Provider, MD  vitamin C (ASCORBIC ACID) 500 MG tablet Take 500 mg by mouth daily.    Historical  Provider, MD      PHYSICAL EXAMINATION:   VITAL SIGNS: Blood pressure 156/74, pulse 132, temperature 103 F (39.4 C), temperature source Oral, resp. rate 17, height 4\' 11"  (1.499 m), weight 79.833 kg (176 lb), SpO2 98 %.  GENERAL:  82 y.o.-year-old patient lying in the bed with no acute distress.  EYES: Pupils equal, round, reactive to light and accommodation. No scleral icterus. Extraocular muscles intact.  HEENT: Head atraumatic, normocephalic. Oropharynx and nasopharynx clear.  NECK:  Supple, no jugular venous distention. No thyroid enlargement, no tenderness.  LUNGS: Normal breath sounds bilaterally, no wheezing, rales,rhonchi or crepitation. No use of accessory muscles of respiration.  CARDIOVASCULAR: S1, S2 normal. No murmurs, rubs, or gallops.  ABDOMEN: Soft, nontender,  nondistended. Bowel sounds present. No organomegaly or mass.  EXTREMITIES: No pedal edema, cyanosis, or clubbing.  NEUROLOGIC: Patient is thousand 8, opens eyes to stimuli but goes back to sleep. Does not follow, but move the limbs to stimuli and pain. PSYCHIATRIC: The patient is drowsy.  SKIN: No obvious rash, lesion, or ulcer.   LABORATORY PANEL:   CBC  Recent Labs Lab 11/21/15 1950  WBC 5.5  HGB 9.9*  HCT 30.5*  PLT 280  MCV 106.8*  MCH 34.6*  MCHC 32.4  RDW 20.2*  LYMPHSABS 0.4*  MONOABS 0.6  EOSABS 0.1  BASOSABS 0.1   ------------------------------------------------------------------------------------------------------------------  Chemistries   Recent Labs Lab 11/21/15 1950  NA 139  K 4.5  CL 107  CO2 22  GLUCOSE 167*  BUN 29*  CREATININE 1.72*  CALCIUM 9.0  AST 25  ALT 20  ALKPHOS 113  BILITOT 0.3   ------------------------------------------------------------------------------------------------------------------ estimated creatinine clearance is 20.2 mL/min (by C-G formula based on Cr of  1.72). ------------------------------------------------------------------------------------------------------------------ No results for input(s): TSH, T4TOTAL, T3FREE, THYROIDAB in the last 72 hours.  Invalid input(s): FREET3   Coagulation profile  Recent Labs Lab 11/21/15 1950  INR 1.03   ------------------------------------------------------------------------------------------------------------------- No results for input(s): DDIMER in the last 72 hours. -------------------------------------------------------------------------------------------------------------------  Cardiac Enzymes  Recent Labs Lab 11/21/15 1950  TROPONINI 0.03   ------------------------------------------------------------------------------------------------------------------ Invalid input(s): POCBNP  ---------------------------------------------------------------------------------------------------------------  Urinalysis    Component Value Date/Time   COLORURINE YELLOW* 11/21/2015 1945   COLORURINE Amber 01/11/2014 1544   APPEARANCEUR TURBID* 11/21/2015 1945   APPEARANCEUR Cloudy 01/11/2014 1544   LABSPEC 1.016 11/21/2015 1945   LABSPEC 1.015 01/11/2014 1544   PHURINE 5.0 11/21/2015 1945   PHURINE 5.0 01/11/2014 1544   GLUCOSEU NEGATIVE 11/21/2015 1945   GLUCOSEU Negative 01/11/2014 1544   HGBUR NEGATIVE 11/21/2015 1945   BILIRUBINUR NEGATIVE 11/21/2015 1945   KETONESUR NEGATIVE 11/21/2015 1945   KETONESUR Trace 01/11/2014 1544   PROTEINUR 100* 11/21/2015 1945   PROTEINUR Negative 01/11/2014 1544   NITRITE NEGATIVE 11/21/2015 1945   NITRITE Negative 01/11/2014 1544   LEUKOCYTESUR 2+* 11/21/2015 1945   LEUKOCYTESUR Negative 01/11/2014 1544     RADIOLOGY: Dg Chest Port 1 View  11/21/2015  CLINICAL DATA:  Family states pt ate some ice cream tonight then went to the bathroom and became weak with uncontrolled shaking. New onset of Afib. History of HTN, stroke, diabetic EXAM: PORTABLE CHEST  1 VIEW COMPARISON:  01/11/2014 FINDINGS: Cardiac silhouette is mildly enlarged. No mediastinal or hilar masses or evidence of adenopathy. Clear lungs. No pleural effusion or pneumothorax. The bony thorax is demineralized but grossly intact. IMPRESSION: No acute cardiopulmonary disease. Electronically Signed   By: Amie Portland M.D.   On: 11/21/2015 20:05    EKG: Orders placed or performed during the hospital encounter of 11/21/15  . ED EKG 12-Lead  . ED EKG 12-Lead    IMPRESSION AND PLAN:  * Sepsis secondary to UTI   He has patient give broad-spectrum antibiotic, I will give IV Zosyn as she had some reaction to Keflex as per her daughter.   Urine culture is sent by ER physician I will wait on that result to adjust to oral antibiotics.   IV fluids to support for sepsis.   Blood cultures are sent.  * Atrial fibrillation with rapid ventricular response   This is likely secondary to sepsis, this is new onset.   Continue monitoring for now with IV fluid given 1 dose of IV metoprolol.   Monitor on telemetry and  follow serial troponin.   This should be resolved once the sepsis is resolved.  * Chronic renal failure   Stable, at baseline.  * Hyperlipidemia   Continue statin.  * Arthritis   Continue home medications.  * Diabetes   Hold oral medication has patient has altered mental status for now, keep on insulin sliding scale coverage.  All the records are reviewed and case discussed with ED provider. Management plans discussed with the patient, family and they are in agreement.  CODE STATUS: Full code Code Status History    This patient does not have a recorded code status. Please follow your organizational policy for patients in this situation.    Advance Directive Documentation        Most Recent Value   Type of Advance Directive  Healthcare Power of Attorney   Pre-existing out of facility DNR order (yellow form or pink MOST form)     "MOST" Form in Place?       Patient's  daughter is her power of attorney, I ask her in detail about the patient's wishes CPR and resuscitation.   She is not very sure what his return in her living will, but suggested to keep patient is a full code for now   Daughter will go home and check in the living will papers about patient's wishes and called the nurses if it would be anything different.  TOTAL TIME TAKING CARE OF THIS PATIENT: 50 critical care minutes.    Altamese Dilling M.D on 11/21/2015   Between 7am to 6pm - Pager - [phone]  After 6pm go to [email or web address] - password [email or web address]  [facility]  Office  [phone]  CC: Primary care physician; Barbette Reichmann, MD   Note: This dictation was prepared with Dragon dictation along with smaller phrase technology. Any transcriptional errors that result from this process are unintentional.

## 2015-11-21 NOTE — ED Notes (Signed)
Will administer ABX one at a time due to patient's multiple allergies. Family states they would appreciate that.

## 2015-11-21 NOTE — ED Provider Notes (Signed)
[facility] Emergency Department Provider Note   ____________________________________________  Time seen: Approximately 7:47 PM  I have reviewed the triage vital signs and the nursing notes.   HISTORY  Chief Complaint Weakness and Code Sepsis  Caveat-history and review of systems Limited due to the patient's altered mental status, all information is obtained from her daughter at bedside.  HPI Kelsey Chung is a 82 y.o. female with history of GERD, hypertension, diabetes, gastric reflux who presents for evaluation of generalized weakness this evening, gradual onset, constant, no modifying factors. Her daughter reports that she fed her dinner the way she normally would but the patient became generally weak and her arms were trembling. On EMS arrival, her heart rate was in the 160s, she was found to be in A. fib with RVR, she received 12.5 mg of Cardizem with improvement of her heart rate. No coughing, no vomiting, no diarrhea. Her daughter reports that she was recently given Keflex for urinary tract infection however she stopped taking that because she had allergic reaction/lip swelling.   Past Medical History  Diagnosis Date  . Diabetes (HCC)   . HBP (high blood pressure)   . Rheumatoid arthritis(714.0)   . High cholesterol   . Reflux   . Gout   . Osteoarthritis (arthritis due to wear and tear of joints)   . Scoliosis   . Constipation, chronic   . Stroke [facility])     Patient Active Problem List   Diagnosis Date Noted  . Absolute anemia 08/30/2015  . Arthritis, degenerative 08/30/2015  . Impaired renal function 08/30/2015  . Rheumatoid arthritis (HCC) 08/30/2015  . Essential (primary) hypertension 03/06/2015    Past Surgical History  Procedure Laterality Date  . Hernia repair    . Gallbladder surgery    . Appendectomy    . Ovarian cyst surgery    . Foot surgery Right   . Eye surgery Bilateral   . Breast cyst excision Left   . Cardiac  catheterization    . Colonoscopy    . Esophagogastroduodenoscopy endoscopy    . Dilation and curettage of uterus    . Esophageal manometry N/A 05/24/2015    Procedure: ESOPHAGEAL MANOMETRY (EM);  Surgeon: Elnita Maxwell, MD;  Location: [facility] ENDOSCOPY;  Service: Endoscopy;  Laterality: N/A;    Current Outpatient Rx  Name  Route  Sig  Dispense  Refill  . allopurinol (ZYLOPRIM) 100 MG tablet               . aspirin 81 MG tablet   Oral   Take 81 mg by mouth daily.         . Cholecalciferol (VITAMIN D PO)   Oral   Take by mouth daily.         . cloNIDine (CATAPRES) 0.2 MG tablet               . cyanocobalamin 1000 MCG tablet   Oral   Take 100 mcg by mouth daily.         Marland Kitchen gabapentin (NEURONTIN) 600 MG tablet      TAKE 1 TABLET (600 MG TOTAL) BY MOUTH 3 (THREE) TIMES DAILY.   90 tablet   11   . glipiZIDE (GLUCOTROL) 5 MG tablet               . Glucosamine HCl (GLUCOSAMINE PO)   Oral   Take by mouth 2 (two) times daily.         Marland Kitchen  hydrALAZINE (APRESOLINE) 25 MG tablet               . hydrochlorothiazide (HYDRODIURIL) 25 MG tablet               . hydrocortisone 2.5 % cream               . IRON PO   Oral   Take by mouth daily.         . isosorbide dinitrate (ISORDIL) 10 MG tablet   Oral   Take by mouth.         . lactulose (CHRONULAC) 10 GM/15ML solution               . losartan (COZAAR) 100 MG tablet               . methotrexate (RHEUMATREX) 2.5 MG tablet               . Omega-3 Fatty Acids (FISH OIL PO)   Oral   Take by mouth 3 (three) times daily.         Marland Kitchen omeprazole (PRILOSEC) 20 MG capsule               . pantoprazole (PROTONIX) 40 MG tablet   Oral   Take by mouth.         . silver sulfADIAZINE (SILVADENE) 1 % cream   Topical   Apply 1 application topically daily.   50 g   0   . simvastatin (ZOCOR) 20 MG tablet   Oral   Take 20 mg by mouth daily.         . traMADol (ULTRAM) 50 MG  tablet               . vitamin C (ASCORBIC ACID) 500 MG tablet   Oral   Take 500 mg by mouth daily.           Allergies Augmentin; Clindamycin; Excedrin extra strength; Meloxicam; Methotrexate sodium; Nsaids; Other; Quinine; and Quinolones  No family history on file.  Social History Social History  Substance Use Topics  . Smoking status: Never Smoker   . Smokeless tobacco: Never Used  . Alcohol Use: No    Review of Systems   Caveat-history and review of systems Limited due to the patient's altered mental status, all information is obtained from her daughter at bedside.  ____________________________________________   PHYSICAL EXAM:  Filed Vitals:   11/21/15 2004  BP: 156/74  Pulse: 132  Temp: 103 F (39.4 C)  TempSrc: Oral  Resp: 17  Height: 4\' 11"  (1.499 m)  Weight: 176 lb (79.833 kg)  SpO2: 98%      Constitutional: Alert and oriented to self and place but not to year or situation. Well appearing and in no acute distress. Eyes: Conjunctivae are normal. PERRL. EOMI. Head: Atraumatic. Nose: No congestion/rhinnorhea. Mouth/Throat: Mucous membranes are dry  Oropharynx non-erythematous. Neck: No stridor.  Supple without meningismus. Cardiovascular: Tachycardic rate, irregular rhythm. Grossly normal heart sounds.  Good peripheral circulation. Respiratory: Normal respiratory effort.  No retractions. Lungs CTAB. Mild tachypnea. Gastrointestinal: Soft and nontender. No distention.  No CVA tenderness. Genitourinary: deferred Musculoskeletal: No lower extremity tenderness nor edema.  No joint effusions. Neurologic:  Normal speech and language. No gross focal neurologic deficits are appreciated.  Skin:  Skin is warm, dry. Small honey crusted superficial skin lesions in the anterior shins bilaterally. Psychiatric: Mood and affect are normal. Speech and behavior are normal.  ____________________________________________   LABS (  all labs ordered are listed, but  only abnormal results are displayed)  Labs Reviewed  LACTIC ACID, PLASMA - Abnormal; Notable for the following:    Lactic Acid, Venous 3.1 (*)    All other components within normal limits  COMPREHENSIVE METABOLIC PANEL - Abnormal; Notable for the following:    Glucose, Bld 167 (*)    BUN 29 (*)    Creatinine, Ser 1.72 (*)    GFR calc non Af Amer 25 (*)    GFR calc Af Amer 29 (*)    All other components within normal limits  CBC WITH DIFFERENTIAL/PLATELET - Abnormal; Notable for the following:    RBC 2.86 (*)    Hemoglobin 9.9 (*)    HCT 30.5 (*)    MCV 106.8 (*)    MCH 34.6 (*)    RDW 20.2 (*)    Lymphs Abs 0.4 (*)    All other components within normal limits  URINALYSIS COMPLETEWITH MICROSCOPIC ([facility] ONLY) - Abnormal; Notable for the following:    Color, Urine YELLOW (*)    APPearance TURBID (*)    Protein, ur 100 (*)    Leukocytes, UA 2+ (*)    Bacteria, UA RARE (*)    All other components within normal limits  CULTURE, BLOOD (ROUTINE X 2)  CULTURE, BLOOD (ROUTINE X 2)  URINE CULTURE  TROPONIN I  APTT  PROTIME-INR  LACTIC ACID, PLASMA  BRAIN NATRIURETIC PEPTIDE   ____________________________________________  EKG  ED ECG REPORT I, Kelsey Chung, the attending physician, personally viewed and interpreted this ECG.   Date: 11/21/2015  EKG Time: 19:31  Rate: 124  Rhythm: atrial fibrillation, rate 124  Axis: normal  Intervals:none  ST&T Change: No acute ST elevation. Mild ST depression in V4, V5, V6.  ____________________________________________  RADIOLOGY  CXR IMPRESSION: No acute cardiopulmonary disease. ____________________________________________   PROCEDURES  Procedure(s) performed: None  Critical Care performed: Yes, see critical care note(s). Total critical care time spent 40 minutes.  CRITICAL CARE Performed by: Toney Rakes A   Total critical care time: 40 minutes  Critical care time was exclusive of separately billable procedures and  treating other patients.  Critical care was necessary to treat or prevent imminent or life-threatening deterioration.  Critical care was time spent personally by me on the following activities: development of treatment plan with patient and/or surrogate as well as nursing, discussions with consultants, evaluation of patient's response to treatment, examination of patient, obtaining history from patient or surrogate, ordering and performing treatments and interventions, ordering and review of laboratory studies, ordering and review of radiographic studies, pulse oximetry and re-evaluation of patient's condition. ____________________________________________   INITIAL IMPRESSION / ASSESSMENT AND PLAN / ED COURSE  Pertinent labs & imaging results that were available during my care of the patient were reviewed by me and considered in my medical decision making (see chart for details).  BRESHA HOSACK is a 82 y.o. female with history of GERD, hypertension, diabetes, gastric reflux who presents for evaluation of generalized weakness this evening as well as rigors. On exam, she is awake and alert, oriented to self and place but not to year, she is febrile with a temperature of 103F, heart rate in the 140s, EKG confirms new onset atrial fibrillation with rapid ventricular rate. She is maintaining adequate blood pressure. She is meeting 3 out of 4 Sirs criteria, good sepsis initiated, we'll give IV fluids, antibiotics, obtain screening labs, urinalysis and chest x-ray. We'll give Cardizem. Anticipate admission. We'll give  antipyretics.  ----------------------------------------- 9:07 PM on 11/21/2015 ----------------------------------------- On reassessment, heart rate improving to 109 bpm, blood pressure remains stable.  lactic acid elevated at 3.1. Creatinine elevated 1.72. Hemoglobin 9.9 appears to be the patient's baseline. Negative troponin. Urinalysis is concerning for urinary tract infection which is  the mostly cause of sepsis. Chest x-ray shows no acute cardio pulmonary disease. Case is discussed with the hospitalist, Dr. Sheryle Hail, for admission at this time.  ____________________________________________   FINAL CLINICAL IMPRESSION(S) / ED DIAGNOSES  Final diagnoses:  Sepsis, due to unspecified organism [facility])  Atrial fibrillation with rapid ventricular response (HCC)  Sepsis secondary to UTI [facility])      NEW MEDICATIONS STARTED DURING THIS VISIT:  New Prescriptions   No medications on file     Note:  This document was prepared using Dragon voice recognition software and may include unintentional dictation errors.    Kelsey Doss, MD 11/21/15 2108

## 2015-11-21 NOTE — Progress Notes (Signed)
Pt admitted to room 242. A&Ox4, VSS, no complaints at this time. Per Shanda Bumps, NT CBG is 166, results not transferring.. Pt oriented to room and unit, and educated on use of call bell, telephone, and safety. Safety contract signed by daughter. Skin assessed with Deatra James, RN. Telemetry verified with Deatra James, RN. RN will continue to monitor and treat per MD orders. Syliva Overman, RN

## 2015-11-21 NOTE — Progress Notes (Signed)
Pharmacy Antibiotic Note  Kelsey Chung is a 82 y.o. female admitted on 11/21/2015 with sepsis.  Pharmacy has been consulted for aztreonam dosing. Patient has multiple antibiotic allergies with anaphylaxis listed to penicillins and lip swelling to cephalosporins.   Plan: Patient was ordered aztreonam 2 g for one dose in ED Will follow with aztreonam 1 g IV q8h per indication and renal function  Height: 4\' 11"  (149.9 cm) Weight: 176 lb (79.833 kg) IBW/kg (Calculated) : 43.2  Temp (24hrs), Avg:103 F (39.4 C), Min:103 F (39.4 C), Max:103 F (39.4 C)   Recent Labs Lab 11/21/15 1950  WBC 5.5  CREATININE 1.72*  LATICACIDVEN 3.1*    Estimated Creatinine Clearance: 20.2 mL/min (by C-G formula based on Cr of 1.72).    Allergies  Allergen Reactions  . Augmentin [Amoxicillin-Pot Clavulanate] Anaphylaxis and Other (See Comments)    Has patient had a PCN reaction causing immediate rash, facial/tongue/throat swelling, SOB or lightheadedness with hypotension: Yes Has patient had a PCN reaction causing severe rash involving mucus membranes or skin necrosis: No Has patient had a PCN reaction that required hospitalization No Has patient had a PCN reaction occurring within the last 10 years: Yes If all of the above answers are "NO", then may proceed with Cephalosporin use.  . Beta Adrenergic Blockers Other (See Comments)    Reaction:  Unknown  . Clindamycin Other (See Comments)    Reaction:  Redness to face/swollen eyes   . Excedrin Extra Strength [Aspirin-Acetaminophen-Caffeine] Diarrhea  . Keflex [Cephalexin] Swelling and Other (See Comments)    Reaction:  Lip swelling   . Meloxicam Other (See Comments)    Reaction:  Decreased renal function  . Methotrexate Sodium Other (See Comments)    Reaction:  Decreased renal function  . Nsaids Other (See Comments)    Reaction:  Unknown   . Quinine Rash  . Quinolones Rash   Antimicrobials this admission: vancomycin ED dose on  6/13 aztreonam 6/13 >>   Dose adjustments this admission:  Microbiology results: 6/13 BCx: Sent 6/13 UCx: Sent   Thank you for allowing pharmacy to be a part of this patient's care.  7/13, PharmD Clinical Pharmacist 11/21/2015 9:37 PM

## 2015-11-22 ENCOUNTER — Inpatient Hospital Stay: Payer: Medicare Other

## 2015-11-22 ENCOUNTER — Encounter: Payer: Self-pay | Admitting: Student

## 2015-11-22 DIAGNOSIS — A419 Sepsis, unspecified organism: Principal | ICD-10-CM

## 2015-11-22 DIAGNOSIS — I10 Essential (primary) hypertension: Secondary | ICD-10-CM

## 2015-11-22 DIAGNOSIS — N184 Chronic kidney disease, stage 4 (severe): Secondary | ICD-10-CM

## 2015-11-22 DIAGNOSIS — N390 Urinary tract infection, site not specified: Secondary | ICD-10-CM

## 2015-11-22 DIAGNOSIS — I219 Acute myocardial infarction, unspecified: Secondary | ICD-10-CM | POA: Diagnosis present

## 2015-11-22 DIAGNOSIS — I213 ST elevation (STEMI) myocardial infarction of unspecified site: Secondary | ICD-10-CM

## 2015-11-22 DIAGNOSIS — I4891 Unspecified atrial fibrillation: Secondary | ICD-10-CM

## 2015-11-22 LAB — GLUCOSE, CAPILLARY
GLUCOSE-CAPILLARY: 112 mg/dL — AB (ref 65–99)
GLUCOSE-CAPILLARY: 130 mg/dL — AB (ref 65–99)
Glucose-Capillary: 166 mg/dL — ABNORMAL HIGH (ref 65–99)
Glucose-Capillary: 86 mg/dL (ref 65–99)
Glucose-Capillary: 146 mg/dL — ABNORMAL HIGH (ref 65–99)

## 2015-11-22 LAB — BASIC METABOLIC PANEL
ANION GAP: 10 (ref 5–15)
BUN: 29 mg/dL — ABNORMAL HIGH (ref 6–20)
CO2: 19 mmol/L — AB (ref 22–32)
Calcium: 8.4 mg/dL — ABNORMAL LOW (ref 8.9–10.3)
Chloride: 112 mmol/L — ABNORMAL HIGH (ref 101–111)
Creatinine, Ser: 1.77 mg/dL — ABNORMAL HIGH (ref 0.44–1.00)
GFR calc Af Amer: 28 mL/min — ABNORMAL LOW (ref >60)
GFR calc non Af Amer: 24 mL/min — ABNORMAL LOW (ref >60)
GLUCOSE: 129 mg/dL — AB (ref 65–99)
POTASSIUM: 4.4 mmol/L (ref 3.5–5.1)
Sodium: 141 mmol/L (ref 135–145)

## 2015-11-22 LAB — HEPARIN LEVEL (UNFRACTIONATED): HEPARIN UNFRACTIONATED: 0.31 [IU]/mL (ref 0.30–0.70)

## 2015-11-22 LAB — CBC
HEMATOCRIT: 29.8 % — AB (ref 35.0–47.0)
HEMOGLOBIN: 9.8 g/dL — AB (ref 12.0–16.0)
MCH: 34.5 pg — AB (ref 26.0–34.0)
MCHC: 33 g/dL (ref 32.0–36.0)
MCV: 104.7 fL — ABNORMAL HIGH (ref 80.0–100.0)
Platelets: 271 10*3/uL (ref 150–440)
RBC: 2.85 MIL/uL — ABNORMAL LOW (ref 3.80–5.20)
RDW: 19.5 % — ABNORMAL HIGH (ref 11.5–14.5)
WBC: 3.3 10*3/uL — ABNORMAL LOW (ref 3.6–11.0)

## 2015-11-22 LAB — TROPONIN I
Troponin I: 0.13 ng/mL — ABNORMAL HIGH (ref <0.031)
Troponin I: 0.23 ng/mL — ABNORMAL HIGH (ref <0.031)
Troponin I: 0.23 ng/mL — ABNORMAL HIGH (ref <0.031)

## 2015-11-22 LAB — LACTIC ACID, PLASMA: Lactic Acid, Venous: 2.4 mmol/L (ref 0.5–2.0)

## 2015-11-22 MED ORDER — HEPARIN BOLUS VIA INFUSION
3800.0000 [IU] | Freq: Once | INTRAVENOUS | Status: AC
  Administered 2015-11-22: 3800 [IU] via INTRAVENOUS
  Filled 2015-11-22: qty 3800

## 2015-11-22 MED ORDER — CLONIDINE HCL 0.1 MG PO TABS
0.1000 mg | ORAL_TABLET | Freq: Two times a day (BID) | ORAL | Status: DC
  Administered 2015-11-22 – 2015-11-23 (×2): 0.1 mg via ORAL
  Filled 2015-11-22 (×2): qty 1

## 2015-11-22 MED ORDER — HEPARIN (PORCINE) IN NACL 100-0.45 UNIT/ML-% IJ SOLN
750.0000 [IU]/h | INTRAMUSCULAR | Status: DC
  Administered 2015-11-22: 750 [IU]/h via INTRAVENOUS
  Filled 2015-11-22: qty 250

## 2015-11-22 MED ORDER — DILTIAZEM HCL 30 MG PO TABS
30.0000 mg | ORAL_TABLET | Freq: Four times a day (QID) | ORAL | Status: DC
  Administered 2015-11-22 – 2015-11-23 (×4): 30 mg via ORAL
  Filled 2015-11-22 (×4): qty 1

## 2015-11-22 MED ORDER — DILTIAZEM HCL 25 MG/5ML IV SOLN
INTRAVENOUS | Status: AC
  Administered 2015-11-22: 5 mg via INTRAVENOUS
  Filled 2015-11-22: qty 5

## 2015-11-22 MED ORDER — LOSARTAN POTASSIUM 50 MG PO TABS
50.0000 mg | ORAL_TABLET | Freq: Every day | ORAL | Status: DC
  Administered 2015-11-23 – 2015-11-25 (×3): 50 mg via ORAL
  Filled 2015-11-22 (×3): qty 1

## 2015-11-22 MED ORDER — FUROSEMIDE 10 MG/ML IJ SOLN
INTRAMUSCULAR | Status: AC
  Administered 2015-11-22: 40 mg via INTRAVENOUS
  Filled 2015-11-22: qty 4

## 2015-11-22 MED ORDER — DILTIAZEM HCL 25 MG/5ML IV SOLN
5.0000 mg | Freq: Four times a day (QID) | INTRAVENOUS | Status: DC | PRN
  Administered 2015-11-22 – 2015-11-23 (×2): 5 mg via INTRAVENOUS
  Filled 2015-11-22: qty 5

## 2015-11-22 MED ORDER — FUROSEMIDE 10 MG/ML IJ SOLN
40.0000 mg | INTRAMUSCULAR | Status: AC
  Administered 2015-11-22: 40 mg via INTRAVENOUS

## 2015-11-22 MED ORDER — HEPARIN (PORCINE) IN NACL 100-0.45 UNIT/ML-% IJ SOLN
750.0000 [IU]/h | INTRAMUSCULAR | Status: DC
  Administered 2015-11-22 – 2015-11-23 (×2): 750 [IU]/h via INTRAVENOUS
  Filled 2015-11-22 (×2): qty 250

## 2015-11-22 NOTE — Consult Note (Signed)
Cardiology Consult    Patient ID: Kelsey Chung MRN: [id number], DOB/AGE: [id number]   Admit date: 11/21/2015 Date of Consult: 11/22/2015  Primary Physician: Barbette Reichmann, MD Reason for Consult: Elevated Troponin, Atrial Fibrillation Primary Cardiologist: New to [facility] Requesting Provider: Dr. Tobi Bastos   History of Present Illness    Kelsey Chung is a 82 y.o. female with past medical history of Type 2 DM, HTN, HLD, RA, Stage 4 CKD, and GERD who presented to [facility] on 11/21/2015 for generalized weakness starting earlier that evening.  Patient currently resides with her daughter and she noticed the patient was acting "abnormal" during the evening hours of 11/21/2015. She overall appeared weak and her arms were trembling. Therefore, she brought her to the ER for further evaluation.   While in the ED, she was noted to be in atrial fibrillation with RVR, HR in the 140's. She was febrile with a temperature of 103. Lactic Acid was elevated to 3.1 and UA was concerning for UTI, therefore she was started on ABX and admitted for sepsis thought secondary to her UTI.   Other studies included a CXR with no acute cardiopulmonary disease. CBC with WBC of 5.5, Hgb 9.9, platelets 280. Creatinine was elevated to 1.72 (was 1.52 one year ago). BNP 226. Cyclic troponin values have been 0.13, 0.23, and 0.23.  In talking with the patient today, she denies any chest pain, dyspnea, palpitations, or orthopnea. Does have chronic lower extremity edema.  The patient's daughter is at the bedside and greatly contributes to the history. The patient has never had a MI. She underwent a cardiac catheterization 20+ years ago but did not require PCI or stent placement. She has not been seen by a Cardiologist in 10+ years. Has been told she has a "heart murmur". No known history of atrial fibrillation, granted she is completely unaware she is in the rhythm today. Does have a long-standing history of HTN, Type 2 DM, and  HLD.  Past Medical History   Past Medical History  Diagnosis Date  . Diabetes (HCC)   . HBP (high blood pressure)   . Rheumatoid arthritis(714.0)   . High cholesterol   . Reflux   . Gout   . Osteoarthritis (arthritis due to wear and tear of joints)   . Scoliosis   . Constipation, chronic   . Stroke [facility])     Past Surgical History  Procedure Laterality Date  . Hernia repair    . Gallbladder surgery    . Appendectomy    . Ovarian cyst surgery    . Foot surgery Right   . Eye surgery Bilateral   . Breast cyst excision Left   . Cardiac catheterization    . Colonoscopy    . Esophagogastroduodenoscopy endoscopy    . Dilation and curettage of uterus    . Esophageal manometry N/A 05/24/2015    Procedure: ESOPHAGEAL MANOMETRY (EM);  Surgeon: Elnita Maxwell, MD;  Location: [facility] ENDOSCOPY;  Service: Endoscopy;  Laterality: N/A;     Allergies  Allergies  Allergen Reactions  . Augmentin [Amoxicillin-Pot Clavulanate] Anaphylaxis and Other (See Comments)    Has patient had a PCN reaction causing immediate rash, facial/tongue/throat swelling, SOB or lightheadedness with hypotension: Yes Has patient had a PCN reaction causing severe rash involving mucus membranes or skin necrosis: No Has patient had a PCN reaction that required hospitalization No Has patient had a PCN reaction occurring within the last 10 years: Yes If all of the above answers are "NO",  then may proceed with Cephalosporin use.  . Beta Adrenergic Blockers Other (See Comments)    Reaction:  Unknown  . Clindamycin Other (See Comments)    Reaction:  Redness to face/swollen eyes   . Excedrin Extra Strength [Aspirin-Acetaminophen-Caffeine] Diarrhea  . Keflex [Cephalexin] Swelling and Other (See Comments)    Reaction:  Lip swelling   . Meloxicam Other (See Comments)    Reaction:  Decreased renal function  . Methotrexate Sodium Other (See Comments)    Reaction:  Decreased renal function  . Nsaids Other (See  Comments)    Reaction:  Unknown   . Quinine Rash  . Quinolones Rash    Inpatient Medications    . allopurinol  100 mg Oral BID  . aspirin EC  81 mg Oral Daily  . aztreonam  1 g Intravenous Q8H  . cholecalciferol  1,000 Units Oral Daily  . cloNIDine  0.2 mg Oral BID  . ferrous sulfate  325 mg Oral Q breakfast  . gabapentin  600 mg Oral TID  . hydrALAZINE  25 mg Oral TID  . hydroxychloroquine  200 mg Oral Daily  . insulin aspart  0-9 Units Subcutaneous TID WC  . isosorbide dinitrate  10 mg Oral TID  . lactulose  20 g Oral BID  . losartan  100 mg Oral Daily  . omega-3 acid ethyl esters  1 g Oral BID  . pantoprazole  40 mg Oral Daily  . rOPINIRole  0.5 mg Oral QHS  . simvastatin  20 mg Oral QHS  . sodium chloride flush  3 mL Intravenous Q12H  . vitamin B-12  1,000 mcg Oral Daily  . vitamin C  500 mg Oral Daily    Family History    Family History  Problem Relation Age of Onset  . Breast cancer Mother   . CAD Father     Social History    Social History   Social History  . Marital Status: Widowed    Spouse Name: N/A  . Number of Children: N/A  . Years of Education: N/A   Occupational History  . Not on file.   Social History Main Topics  . Smoking status: Never Smoker   . Smokeless tobacco: Never Used  . Alcohol Use: No  . Drug Use: No  . Sexual Activity: Not on file   Other Topics Concern  . Not on file   Social History Narrative     Review of Systems    General:  No chills, fever, night sweats or weight changes.  Cardiovascular:  No chest pain, dyspnea on exertion, edema, orthopnea, palpitations, paroxysmal nocturnal dyspnea. Dermatological: No rash, lesions/masses Respiratory: No cough, dyspnea Urologic: No hematuria, dysuria Abdominal:   No nausea, vomiting, diarrhea, bright red blood per rectum, melena, or hematemesis. Positive for abdominal pain. Neurologic:  No visual changes, changes in mental status. Positive for generalized weakness. All  other systems reviewed and are otherwise negative except as noted above.  Physical Exam    Blood pressure 135/67, pulse 96, temperature 99.6 F (37.6 C), temperature source Oral, resp. rate 17, height 4\' 11"  (1.499 m), weight 183 lb 6.4 oz (83.19 kg), SpO2 96 %.  General: Pleasant, elderly Caucasian female appearing in NAD. Psych: Normal affect. Neuro: Alert and oriented X 3. Moves all extremities spontaneously. HEENT: Normal  Neck: Supple without bruits or JVD. Lungs:  Resp regular and unlabored, CTA without wheezing or rales. Heart: Irregularly irregular, no s3, s4, 2/6 harsh SEM at LUSB Abdomen: Soft, non-tender,  non-distended, BS + x 4.  Extremities: No clubbing, cyanosis. Trace edema. Chronic stasis noted lower extremities. DP/PT/Radials 2+ and equal bilaterally.  Labs    Troponin (Point of Care Test) No results for input(s): TROPIPOC in the last 72 hours.  Recent Labs  12-07-2015 1950 12-07-15 2337 11/22/15 0336  TROPONINI 0.03 0.13* 0.23*   Lab Results  Component Value Date   WBC 3.3* 11/22/2015   HGB 9.8* 11/22/2015   HCT 29.8* 11/22/2015   MCV 104.7* 11/22/2015   PLT 271 11/22/2015    Recent Labs Lab 12/07/15 1950 11/22/15 0336  NA 139 141  K 4.5 4.4  CL 107 112*  CO2 22 19*  BUN 29* 29*  CREATININE 1.72* 1.77*  CALCIUM 9.0 8.4*  PROT 6.9  --   BILITOT 0.3  --   ALKPHOS 113  --   ALT 20  --   AST 25  --   GLUCOSE 167* 129*     Radiology Studies    Dg Chest Port 1 View: 12/07/2015  CLINICAL DATA:  Family states pt ate some ice cream tonight then went to the bathroom and became weak with uncontrolled shaking. New onset of Afib. History of HTN, stroke, diabetic EXAM: PORTABLE CHEST 1 VIEW COMPARISON:  01/11/2014 FINDINGS: Cardiac silhouette is mildly enlarged. No mediastinal or hilar masses or evidence of adenopathy. Clear lungs. No pleural effusion or pneumothorax. The bony thorax is demineralized but grossly intact. IMPRESSION: No acute cardiopulmonary  disease. Electronically Signed   By: Amie Portland M.D.   On: 2015/12/07 20:05    EKG & Cardiac Imaging    EKG: Atrial fibrillation, HR 124, nonspecific ST abnormality Leads II and V2.   Echocardiogram: None on File  Assessment & Plan    1. New Onset Atrial Fibrillation  - presented with generalized weakness and abdominal pain. Diagnosed with sepsis secondary to a UTI, the likely trigger for the new-onset atrial fibrillation. Denies a prior history of atrial fibrillation. No current palpitations.  - This patients CHA2DS2-VASc Score and unadjusted Ischemic Stroke Rate (% per year) is equal to 11.2 % stroke rate/year from a score of 7 (HTN, DM, Female, TIA(2) Age (2)). Continue Heparin currently. Addressed the topic of long-term anticoagulation with the patient and her daughter. Will need to consider once her acute illness improves. - HR has been in the low-100's - 110's. Will start short-acting PO Cardizem 30mg  QID (has listed allergy to beta-blockers, granted no reactions are listed). Once HR improves, would perform an echocardiogram. If LV function significantly reduced, would need to switch Cardizem to a BB.   2. Elevated Troponin - cyclic troponin values have been 0.13, 0.23, and 0.23. EKG shows nonspecific abnormality in Leads II and V2.  - she denies any recent chest discomfort or anginal symptoms. - would obtain an echocardiogram once her HR has improved to assess LV function and to further assess valve function in the setting of her heart murmur.  3. HTN - BP has been 111/50 - 156/74 in the past 24 hours.  - on Clonidine 0.2mg  BID, Hydralazine 25mg  TID, Isordil 10mg  TID, and Losartan 100mg  daily.  - Discussed with Dr. , will decrease Losartan to 50mg  daily in setting of Stage 4 CKD and starting Cardizem to allow ample room for BP.  Will also decrease Clonidine to 0.1mg  BID. Would hope to wean Clonidine this admission and potentially stop, for this would not be a preferred  medication in this 82 year old elderly female.  4. HLD -  continue statin therapy.  5. Stage 4 CKD - creatinine 1.72 on admission. Was 1.52 one year ago. - continue to monitor.  6. Sepsis secondary to UTI - Lactic Acid elevated to 3.1 on admission, febrile, and tachycardiac.  - per admitting team   Signed, Ellsworth Lennox, PA-C 11/22/2015, 8:33 AM Pager: [phone]

## 2015-11-22 NOTE — Progress Notes (Signed)
Patients O2 sats at 85% on room air for noon vitals. Upon assessment patients breathing is not labored, crackles auscultated bilaterally, placed on 4L O2 and sats now at 98-99%. Patient reports no discomfort or shortness of breath. Dr. Elisabeth Pigeon paged and aware, orders to d/c fluids until further notice. Respiratory asked to come assess as well. Family at bedside updated. Will continue to monitor.

## 2015-11-22 NOTE — Progress Notes (Signed)
MD Dr. Sheryle Hail notified of elevated troponin and lactic acid, no new orders. RN will continue to monitor. Syliva Overman, RN

## 2015-11-22 NOTE — Progress Notes (Signed)
ANTICOAGULATION CONSULT NOTE - Initial Consult  Pharmacy Consult for heparin drip Indication: NSTEMI  Allergies  Allergen Reactions  . Augmentin [Amoxicillin-Pot Clavulanate] Anaphylaxis and Other (See Comments)    Has patient had a PCN reaction causing immediate rash, facial/tongue/throat swelling, SOB or lightheadedness with hypotension: Yes Has patient had a PCN reaction causing severe rash involving mucus membranes or skin necrosis: No Has patient had a PCN reaction that required hospitalization No Has patient had a PCN reaction occurring within the last 10 years: Yes If all of the above answers are "NO", then may proceed with Cephalosporin use.  . Beta Adrenergic Blockers Other (See Comments)    Reaction:  Unknown  . Clindamycin Other (See Comments)    Reaction:  Redness to face/swollen eyes   . Excedrin Extra Strength [Aspirin-Acetaminophen-Caffeine] Diarrhea  . Keflex [Cephalexin] Swelling and Other (See Comments)    Reaction:  Lip swelling   . Meloxicam Other (See Comments)    Reaction:  Decreased renal function  . Methotrexate Sodium Other (See Comments)    Reaction:  Decreased renal function  . Nsaids Other (See Comments)    Reaction:  Unknown   . Quinine Rash  . Quinolones Rash    Patient Measurements: Height: 4\' 11"  (149.9 cm) Weight: 183 lb 6.4 oz (83.19 kg) IBW/kg (Calculated) : 43.2 Heparin Dosing Weight: 62kg   Vital Signs: Temp: 98.2 F (36.8 C) (06/14 1233) Temp Source: Oral (06/14 1233) BP: 146/82 mmHg (06/14 1233) Pulse Rate: 65 (06/14 1233)  Labs:  Recent Labs  11/21/15 1950 11/21/15 2337 11/22/15 0336 11/22/15 0821 11/22/15 1551  HGB 9.9*  --  9.8*  --   --   HCT 30.5*  --  29.8*  --   --   PLT 280  --  271  --   --   APTT 27  --   --   --   --   LABPROT 13.7  --   --   --   --   INR 1.03  --   --   --   --   HEPARINUNFRC  --   --   --   --  0.31  CREATININE 1.72*  --  1.77*  --   --   TROPONINI 0.03 0.13* 0.23* 0.23*  --     Estimated Creatinine Clearance: 20.1 mL/min (by C-G formula based on Cr of 1.77).  Medical History: Past Medical History  Diagnosis Date  . Diabetes (HCC)   . Hypertension   . Rheumatoid arthritis(714.0)   . Hyperlipidemia   . Gout   . Osteoarthritis (arthritis due to wear and tear of joints)   . Scoliosis   . Constipation, chronic   . CKD (chronic kidney disease) stage 4, GFR 15-29 ml/min (HCC)   . New onset atrial fibrillation (HCC)     a. diagnosed in 11/2015 - admitted for sepsis  . TIA (transient ischemic attack)     a. multiple TIA's 20+ years ago according to patient's daughter   Assessment: Pharmacy consulted to dose and monitor heparin drip in this 82 year old female for NSTEMI. Patient was not taking anticoagulants prior to admission.   Baseline labs were obtained on admission and are as followed: Hgb 9.9 Plt 280  APTT 27 INR 1.03  First heparin level drawn at 1551 this afternoon is therapeutic at 0.31.   Goal of Therapy:  Heparin level 0.3-0.7 units/ml Monitor platelets by anticoagulation protocol: Yes   Plan:  Continue heparin drip at current  rate of 750 units/hr. Will order confirmatory heparin level in 8 hours. CBC ordered with AM labs tomorrow.  Pharmacy will continue to monitor.  Cindi Carbon, PharmD Clinical Pharmacist 11/22/2015,5:09 PM

## 2015-11-22 NOTE — Progress Notes (Signed)
[employer] -  at [facility]   PATIENT NAME: Kelsey Chung    MR#:  [id number]  DATE OF BIRTH:  July 03, 1923  SUBJECTIVE:  CHIEF COMPLAINT:   Chief Complaint  Patient presents with  . Weakness  . Code Sepsis     Came septic with UTI, was confused yesterday,today is more oriented and talking.  REVIEW OF SYSTEMS:   GENERAL: 82 y.o.-year-old patient lying in the bed with no acute distress.  EYES: Pupils equal, round, reactive to light and accommodation. No scleral icterus. Extraocular muscles intact.  HEENT: Head atraumatic, normocephalic. Oropharynx and nasopharynx clear.  NECK: Supple, no jugular venous distention. No thyroid enlargement, no tenderness.  LUNGS: Normal breath sounds bilaterally, no wheezing, rales,rhonchi or crepitation. No use of accessory muscles of respiration.  CARDIOVASCULAR: S1, S2 normal. No murmurs, rubs, or gallops.  ABDOMEN: Soft, nontender, nondistended. Bowel sounds present. No organomegaly or mass.  EXTREMITIES: No pedal edema, cyanosis, or clubbing.  NEUROLOGIC: Patient is sleepy, but easily arousable, and follows simple commands. PSYCHIATRIC: The patient is drowsy but easily aroused and oriented .  SKIN: No obvious rash, lesion, or ulcer.   ROS  DRUG ALLERGIES:   Allergies  Allergen Reactions  . Augmentin [Amoxicillin-Pot Clavulanate] Anaphylaxis and Other (See Comments)    Has patient had a PCN reaction causing immediate rash, facial/tongue/throat swelling, SOB or lightheadedness with hypotension: Yes Has patient had a PCN reaction causing severe rash involving mucus membranes or skin necrosis: No Has patient had a PCN reaction that required hospitalization No Has patient had a PCN reaction occurring within the last 10 years: Yes If all of the above answers are "NO", then may proceed with Cephalosporin use.  . Beta Adrenergic Blockers Other (See Comments)    Reaction:  Unknown  . Clindamycin Other (See  Comments)    Reaction:  Redness to face/swollen eyes   . Excedrin Extra Strength [Aspirin-Acetaminophen-Caffeine] Diarrhea  . Keflex [Cephalexin] Swelling and Other (See Comments)    Reaction:  Lip swelling   . Meloxicam Other (See Comments)    Reaction:  Decreased renal function  . Methotrexate Sodium Other (See Comments)    Reaction:  Decreased renal function  . Nsaids Other (See Comments)    Reaction:  Unknown   . Quinine Rash  . Quinolones Rash    VITALS:  Blood pressure 146/44, pulse 82, temperature 101.1 F (38.4 C), temperature source Oral, resp. rate 18, height 4\' 11"  (1.499 m), weight 83.19 kg (183 lb 6.4 oz), SpO2 100 %.  PHYSICAL EXAMINATION:  GENERAL:  82 y.o.-year-old patient lying in the bed with no acute distress.  EYES: Pupils equal, round, reactive to light and accommodation. No scleral icterus. Extraocular muscles intact.  HEENT: Head atraumatic, normocephalic. Oropharynx and nasopharynx clear.  NECK:  Supple, no jugular venous distention. No thyroid enlargement, no tenderness.  LUNGS: Normal breath sounds bilaterally, no wheezing, rales,rhonchi or crepitation. No use of accessory muscles of respiration.  CARDIOVASCULAR: S1, S2 normal. No murmurs, rubs, or gallops.  ABDOMEN: Soft, nontender, nondistended. Bowel sounds present. No organomegaly or mass.  EXTREMITIES: No pedal edema, cyanosis, or clubbing.  NEUROLOGIC: Cranial nerves II through XII are intact. Muscle strength 5/5 in all extremities. Sensation intact. Gait not checked.  PSYCHIATRIC: The patient is alert and oriented x 3.  SKIN: No obvious rash, lesion, or ulcer.   Physical Exam LABORATORY PANEL:   CBC  Recent Labs Lab 11/22/15 0336  WBC 3.3*  HGB 9.8*  HCT 29.8*  PLT 271   ------------------------------------------------------------------------------------------------------------------  Chemistries   Recent Labs Lab 11/21/15 1950 11/22/15 0336  NA 139 141  K 4.5 4.4  CL 107 112*   CO2 22 19*  GLUCOSE 167* 129*  BUN 29* 29*  CREATININE 1.72* 1.77*  CALCIUM 9.0 8.4*  AST 25  --   ALT 20  --   ALKPHOS 113  --   BILITOT 0.3  --    ------------------------------------------------------------------------------------------------------------------  Cardiac Enzymes  Recent Labs Lab 11/22/15 0336 11/22/15 0821  TROPONINI 0.23* 0.23*   ------------------------------------------------------------------------------------------------------------------  RADIOLOGY:  Dg Chest 1 View  11/22/2015  CLINICAL DATA:  Acute onset of generalized weakness. Code sepsis. Initial encounter. EXAM: CHEST 1 VIEW COMPARISON:  Chest radiograph performed 11/21/2015 FINDINGS: The lungs are well-aerated. Mild bibasilar airspace opacities may reflect mild pneumonia. There is no evidence of pleural effusion or pneumothorax. The cardiomediastinal silhouette is enlarged. No acute osseous abnormalities are seen. IMPRESSION: 1. Mild bibasilar opacities may reflect mild pneumonia. 2. Cardiomegaly. Electronically Signed   By: Roanna Raider M.D.   On: 11/22/2015 18:32   Dg Chest Port 1 View  11/21/2015  CLINICAL DATA:  Family states pt ate some ice cream tonight then went to the bathroom and became weak with uncontrolled shaking. New onset of Afib. History of HTN, stroke, diabetic EXAM: PORTABLE CHEST 1 VIEW COMPARISON:  01/11/2014 FINDINGS: Cardiac silhouette is mildly enlarged. No mediastinal or hilar masses or evidence of adenopathy. Clear lungs. No pleural effusion or pneumothorax. The bony thorax is demineralized but grossly intact. IMPRESSION: No acute cardiopulmonary disease. Electronically Signed   By: Amie Portland M.D.   On: 11/21/2015 20:05    ASSESSMENT AND PLAN:   Principal Problem:   Sepsis (HCC) Active Problems:   Essential (primary) hypertension   CKD (chronic kidney disease) stage 4, GFR 15-29 ml/min (HCC)   UTI (lower urinary tract infection)   Atrial fibrillation with rapid  ventricular response (HCC)   Demand myocardial infarction (HCC) - Type II MI in setting of Sepsis   New onset atrial fibrillation (HCC)   Sepsis secondary to UTI (HCC)  * Sepsis secondary to UTI  Aztreonam due to multiple Abx allergies.  Urine culture is sent by ER physician I will wait on that result to adjust to oral antibiotics.  IV fluids to support for sepsis.  Blood cultures are sent.  * Atrial fibrillation with rapid ventricular response  This is likely secondary to sepsis, this is new onset.  Continue monitoring for now with IV fluid given 1 dose of IV metoprolol.  Monitor on telemetry and follow serial troponin.  This should be resolved once the sepsis is resolved.   Appreciated cardiology help, started on cardizem and some BP meds changed.  * Chronic renal failure  Stable, at baseline.  * Hyperlipidemia  Continue statin.  * Arthritis  Continue home medications.  * Diabetes  Hold oral medication has patient has altered mental status for now, keep on insulin sliding scale coverage.     All the records are reviewed and case discussed with Care Management/Social Workerr. Management plans discussed with the patient, family and they are in agreement.  CODE STATUS: full  TOTAL TIME TAKING CARE OF THIS PATIENT: 35 minutes.  Her grand daughter was present in room.   POSSIBLE D/C IN 1-2 DAYS, DEPENDING ON CLINICAL CONDITION.   Altamese Dilling M.D on 11/22/2015   Between 7am to 6pm - Pager - [phone]  After 6pm go to [email or web address] - password [email or web address]  [employer]  [phone]  CC: Primary care physician; Barbette Reichmann, MD  Note: This dictation was prepared with Dragon dictation along with smaller phrase technology. Any transcriptional errors that result from this process are unintentional.

## 2015-11-22 NOTE — Progress Notes (Signed)
ANTICOAGULATION CONSULT NOTE - Initial Consult  Pharmacy Consult for heparin drip Indication: NSTEMI  Allergies  Allergen Reactions  . Augmentin [Amoxicillin-Pot Clavulanate] Anaphylaxis and Other (See Comments)    Has patient had a PCN reaction causing immediate rash, facial/tongue/throat swelling, SOB or lightheadedness with hypotension: Yes Has patient had a PCN reaction causing severe rash involving mucus membranes or skin necrosis: No Has patient had a PCN reaction that required hospitalization No Has patient had a PCN reaction occurring within the last 10 years: Yes If all of the above answers are "NO", then may proceed with Cephalosporin use.  . Beta Adrenergic Blockers Other (See Comments)    Reaction:  Unknown  . Clindamycin Other (See Comments)    Reaction:  Redness to face/swollen eyes   . Excedrin Extra Strength [Aspirin-Acetaminophen-Caffeine] Diarrhea  . Keflex [Cephalexin] Swelling and Other (See Comments)    Reaction:  Lip swelling   . Meloxicam Other (See Comments)    Reaction:  Decreased renal function  . Methotrexate Sodium Other (See Comments)    Reaction:  Decreased renal function  . Nsaids Other (See Comments)    Reaction:  Unknown   . Quinine Rash  . Quinolones Rash    Patient Measurements: Height: 4\' 11"  (149.9 cm) Weight: 183 lb 6.4 oz (83.19 kg) IBW/kg (Calculated) : 43.2 Heparin Dosing Weight: 63kg  Vital Signs: Temp: 99.6 F (37.6 C) (06/14 0508) Temp Source: Oral (06/14 0508) BP: 135/67 mmHg (06/14 0508) Pulse Rate: 96 (06/14 0508)  Labs:  Recent Labs  11/21/15 1950 11/21/15 2337 11/22/15 0336  HGB 9.9*  --  9.8*  HCT 30.5*  --  29.8*  PLT 280  --  271  APTT 27  --   --   LABPROT 13.7  --   --   INR 1.03  --   --   CREATININE 1.72*  --  1.77*  TROPONINI 0.03 0.13* 0.23*    Estimated Creatinine Clearance: 20.1 mL/min (by C-G formula based on Cr of 1.77).   Medical History: Past Medical History  Diagnosis Date  . Diabetes  (HCC)   . HBP (high blood pressure)   . Rheumatoid arthritis(714.0)   . High cholesterol   . Reflux   . Gout   . Osteoarthritis (arthritis due to wear and tear of joints)   . Scoliosis   . Constipation, chronic   . Stroke [facility])     Medications:    Assessment:  Goal of Therapy:  Heparin level 0.3-0.7 units/ml Monitor platelets by anticoagulation protocol: Yes   Plan:  3800 unit bolus and initial rate of 750 units/hr. First heparin level 8 hours after start of drip.  Navreet Bolda S 11/22/2015,6:24 AM

## 2015-11-22 NOTE — Progress Notes (Signed)
MD gave RN orders for ibuprofen but pt has documented allergy of it. MD made aware, to come round on patient now and then decide how to treat fever. Will continue to monitor.

## 2015-11-22 NOTE — Care Management (Signed)
Presents from home where she lives with her daughter. She is ambulatory with a cane and a walker. PCP is Dr. Marcello Fennel. Took outpatient treatment for a UTI but after a reaction was never prescribed another antibiotic. Now with sepsis due to UTI, afib requiring a heparin gtt.  Will follow progression.

## 2015-11-22 NOTE — Progress Notes (Signed)
Physical Therapy Treatment Patient Details Name: Kelsey Chung MRN: [id number] DOB: [birth year] Today's Date: 11/22/2015    History of Present Illness [name]. Pt with elevated troponin, however cleared by cardio as demand ischemia. Pt with complaints of weakness. Pt with history of DM, HTN, GERD, OA, and CVA.     PT Comments    Pt is a pleasant 82 year old female who was admitted for sepsis secondary to UTI. Pt performs bed mobility with min assist, however unable to further perform transfers at this time secondary to balance impairments. All mobility performed on 4L of O2 with sats at 99%. Will continue to progress mobility. Pt demonstrates deficits with strength/endurance/mobility. Would benefit from skilled PT to address above deficits and promote optimal return to PLOF. Pt very motivated to perform mobility and go back home.    Follow Up Recommendations  Home health PT;Supervision/Assistance - 24 hour     Equipment Recommendations       Recommendations for Other Services       Precautions / Restrictions Precautions Precautions: Fall Restrictions Weight Bearing Restrictions: No    Mobility  Bed Mobility Overal bed mobility: Needs Assistance Bed Mobility: Supine to Sit     Supine to sit: Min assist     General bed mobility comments: assist for trunk stability. Once seated at EOB, pt able to sit with cga.  Pt with increased lateral sway noted with seated balance. Unsafe to further transfers at this time  Transfers                    Ambulation/Gait                 Stairs            Wheelchair Mobility    Modified Rankin (Stroke Patients Only)       Balance Overall balance assessment: Needs assistance Sitting-balance support: Feet supported Sitting balance-Leahy Scale: Fair                              Cognition Arousal/Alertness: Awake/alert Behavior During Therapy: WFL for tasks  assessed/performed Overall Cognitive Status: Within Functional Limits for tasks assessed                      Exercises Other Exercises Other Exercises: Supine ther-ex performed including ankle pumps, SLRs, and shoulder flexion. All ther-ex performed x 10 reps with cga. Safe technique performed.    General Comments        Pertinent Vitals/Pain Pain Assessment: No/denies pain    Home Living Family/patient expects to be discharged to:: Private residence Living Arrangements: Children Available Help at Discharge: Family Type of Home: House Home Access: Level entry   Home Layout: One level Home Equipment: Environmental consultant - 2 wheels;Cane - single point      Prior Function Level of Independence: Independent with assistive device(s)      Comments: used SPC and RW   PT Goals (current goals can now be found in the care plan section) Acute Rehab PT Goals Patient Stated Goal: to get stronger PT Goal Formulation: With patient Time For Goal Achievement: 12/06/15 Potential to Achieve Goals: Good    Frequency  Min 2X/week    PT Plan      Co-evaluation             End of Session Equipment Utilized During Treatment: Oxygen Activity Tolerance:  Patient tolerated treatment well Patient left: in bed;with bed alarm set     Time: 1534-1600 PT Time Calculation (min) (ACUTE ONLY): 26 min  Charges:  $Therapeutic Exercise: 8-22 mins                    G Codes:      Kelsey Chung December 02, 2015, 5:15 PM Kelsey Chung, PT, DPT [phone]

## 2015-11-22 NOTE — Progress Notes (Addendum)
RN noted on tele that patients HR is sustaining in 130s-140s, upon arrival to room, pt is awake, A&O, lungs auscultated bilaterally, worse than earlier assessment. O2 sats maintaining at 99% on 4L O2, pt does not complain of shortness of breath, but work of breathing does appear increased from prior assessment. No PRNs ordered to address HR. Prime doc paged, orders for IV lasix and CXR received, MD to assess chart and decide what orders to place for HR. Will administer and continue to monitor.

## 2015-11-22 NOTE — Progress Notes (Addendum)
Pt has 100F axillary temp, received tylenol at ~0645 this morning. Soft BP as well. Dr. Elisabeth Pigeon paged and orders received to address temp and to hold losartan and clonidine. Room temp decreased and blanket removed to cool patient. Will continue to monitor.

## 2015-11-22 NOTE — Progress Notes (Signed)
Pt troponin further elevated to 0.23, MD Dr. Tobi Bastos notified, will place orders for heparin drip. RN will continue to monitor. Syliva Overman, RN

## 2015-11-23 ENCOUNTER — Telehealth: Payer: Self-pay | Admitting: *Deleted

## 2015-11-23 ENCOUNTER — Inpatient Hospital Stay (HOSPITAL_COMMUNITY)
Admit: 2015-11-23 | Discharge: 2015-11-23 | Disposition: A | Discharge status: Home / Self Care | Payer: Medicare Other | Attending: Internal Medicine | Admitting: Internal Medicine

## 2015-11-23 DIAGNOSIS — I509 Heart failure, unspecified: Secondary | ICD-10-CM

## 2015-11-23 LAB — ECHOCARDIOGRAM COMPLETE
AO mean calculated velocity dopler: 193 cm/s
AOPV: 0.31 m/s
AOVTI: 54.2 cm
AV Area VTI index: 0.65 cm2/m2
AV Area VTI: 0.96 cm2
AV VEL mean LVOT/AV: 0.46
AV pk vel: 283 cm/s
AVAREAMEANV: 1.43 cm2
AVAREAMEANVIN: 0.81 cm2/m2
AVG: 19 mmHg
AVPG: 32 mmHg
CHL CUP AV PEAK INDEX: 0.54
CHL CUP AV VEL: 1.15
CHL CUP MV DEC (S): 248
E decel time: 248 msec
FS: 29 % (ref 28–44)
Height: 59 in
IV/PV OW: 1.04
LA ID, A-P, ES: 47 mm
LA vol index: 53.8 mL/m2
LA vol: 95.7 mL
LADIAMINDEX: 2.64 cm/m2
LAVOLA4C: 99.4 mL
LEFT ATRIUM END SYS DIAM: 47 mm
LVOT VTI: 19.8 cm
LVOT area: 3.14 cm2
LVOT diameter: 20 mm
LVOT peak vel: 86.4 cm/s
LVOTSV: 62 mL
LVOTVTI: 0.37 cm
MV Peak grad: 5 mmHg
MVPKEVEL: 117 m/s
PW: 15.1 mm — AB (ref 0.6–1.1)
Valve area index: 0.65
Valve area: 1.15 cm2
WEIGHTICAEL: 2934.4 [oz_av]

## 2015-11-23 LAB — BLOOD CULTURE ID PANEL (REFLEXED)
ACINETOBACTER BAUMANNII: NOT DETECTED
CANDIDA KRUSEI: NOT DETECTED
CARBAPENEM RESISTANCE: NOT DETECTED
Candida albicans: NOT DETECTED
Candida glabrata: NOT DETECTED
Candida parapsilosis: NOT DETECTED
Candida tropicalis: NOT DETECTED
ENTEROBACTERIACEAE SPECIES: NOT DETECTED
Enterobacter cloacae complex: NOT DETECTED
Enterococcus species: NOT DETECTED
Escherichia coli: NOT DETECTED
Haemophilus influenzae: NOT DETECTED
KLEBSIELLA OXYTOCA: NOT DETECTED
Klebsiella pneumoniae: NOT DETECTED
Listeria monocytogenes: NOT DETECTED
Methicillin resistance: DETECTED — AB
NEISSERIA MENINGITIDIS: NOT DETECTED
PSEUDOMONAS AERUGINOSA: NOT DETECTED
Proteus species: NOT DETECTED
STAPHYLOCOCCUS SPECIES: DETECTED — AB
Serratia marcescens: NOT DETECTED
Staphylococcus aureus (BCID): NOT DETECTED
Streptococcus agalactiae: NOT DETECTED
Streptococcus pneumoniae: NOT DETECTED
Streptococcus pyogenes: NOT DETECTED
Streptococcus species: NOT DETECTED
Vancomycin resistance: NOT DETECTED

## 2015-11-23 LAB — URINE CULTURE: CULTURE: NO GROWTH

## 2015-11-23 LAB — CBC
HCT: 24.6 % — ABNORMAL LOW (ref 35.0–47.0)
Hemoglobin: 8 g/dL — ABNORMAL LOW (ref 12.0–16.0)
MCH: 33.6 pg (ref 26.0–34.0)
MCHC: 32.6 g/dL (ref 32.0–36.0)
MCV: 103.1 fL — ABNORMAL HIGH (ref 80.0–100.0)
PLATELETS: 235 10*3/uL (ref 150–440)
RBC: 2.38 MIL/uL — AB (ref 3.80–5.20)
RDW: 18.5 % — ABNORMAL HIGH (ref 11.5–14.5)
WBC: 2 10*3/uL — ABNORMAL LOW (ref 3.6–11.0)

## 2015-11-23 LAB — BASIC METABOLIC PANEL
Anion gap: 9 (ref 5–15)
BUN: 30 mg/dL — ABNORMAL HIGH (ref 6–20)
CALCIUM: 8.1 mg/dL — AB (ref 8.9–10.3)
CO2: 19 mmol/L — AB (ref 22–32)
CREATININE: 1.49 mg/dL — AB (ref 0.44–1.00)
Chloride: 113 mmol/L — ABNORMAL HIGH (ref 101–111)
GFR calc Af Amer: 35 mL/min — ABNORMAL LOW (ref >60)
GFR, EST NON AFRICAN AMERICAN: 30 mL/min — AB (ref >60)
GLUCOSE: 141 mg/dL — AB (ref 65–99)
Potassium: 3.3 mmol/L — ABNORMAL LOW (ref 3.5–5.1)
Sodium: 141 mmol/L (ref 135–145)

## 2015-11-23 LAB — HEPARIN LEVEL (UNFRACTIONATED): HEPARIN UNFRACTIONATED: 0.35 [IU]/mL (ref 0.30–0.70)

## 2015-11-23 LAB — GLUCOSE, CAPILLARY
GLUCOSE-CAPILLARY: 180 mg/dL — AB (ref 65–99)
Glucose-Capillary: 137 mg/dL — ABNORMAL HIGH (ref 65–99)
Glucose-Capillary: 168 mg/dL — ABNORMAL HIGH (ref 65–99)
Glucose-Capillary: 225 mg/dL — ABNORMAL HIGH (ref 65–99)

## 2015-11-23 MED ORDER — IPRATROPIUM BROMIDE 0.02 % IN SOLN: 0.5000 mg | Freq: Four times a day (QID) | RESPIRATORY_TRACT | Status: DC | PRN

## 2015-11-23 MED ORDER — SODIUM CHLORIDE 0.9 % IV SOLN
INTRAVENOUS | Status: DC
  Administered 2015-11-23: 07:00:00 via INTRAVENOUS

## 2015-11-23 MED ORDER — IPRATROPIUM BROMIDE 0.02 % IN SOLN
0.5000 mg | Freq: Four times a day (QID) | RESPIRATORY_TRACT | Status: DC
  Administered 2015-11-23: 0.5 mg via RESPIRATORY_TRACT
  Filled 2015-11-23: qty 2.5

## 2015-11-23 MED ORDER — VANCOMYCIN HCL IN DEXTROSE 750-5 MG/150ML-% IV SOLN
750.0000 mg | INTRAVENOUS | Status: DC
Start: 2015-11-23 — End: 2015-11-23
  Filled 2015-11-23: qty 150

## 2015-11-23 MED ORDER — LEVALBUTEROL HCL 0.63 MG/3ML IN NEBU: 0.6300 mg | INHALATION_SOLUTION | Freq: Four times a day (QID) | RESPIRATORY_TRACT | Status: DC | PRN

## 2015-11-23 MED ORDER — VANCOMYCIN HCL IN DEXTROSE 750-5 MG/150ML-% IV SOLN
750.0000 mg | INTRAVENOUS | Status: DC
  Administered 2015-11-23 – 2015-11-24 (×2): 750 mg via INTRAVENOUS
  Filled 2015-11-23 (×3): qty 150

## 2015-11-23 MED ORDER — VANCOMYCIN HCL IN DEXTROSE 750-5 MG/150ML-% IV SOLN
750.0000 mg | Freq: Once | INTRAVENOUS | Status: DC
  Filled 2015-11-23: qty 150

## 2015-11-23 MED ORDER — DILTIAZEM HCL 60 MG PO TABS
60.0000 mg | ORAL_TABLET | Freq: Three times a day (TID) | ORAL | Status: DC
  Administered 2015-11-23 – 2015-11-24 (×3): 60 mg via ORAL
  Filled 2015-11-23 (×3): qty 1

## 2015-11-23 MED ORDER — LEVALBUTEROL HCL 0.63 MG/3ML IN NEBU
0.6300 mg | INHALATION_SOLUTION | Freq: Four times a day (QID) | RESPIRATORY_TRACT | Status: DC
  Administered 2015-11-23: 0.63 mg via RESPIRATORY_TRACT
  Filled 2015-11-23: qty 3

## 2015-11-23 MED ORDER — PERFLUTREN LIPID MICROSPHERE
INTRAVENOUS | Status: AC
  Administered 2015-11-23: 12:00:00
  Filled 2015-11-23: qty 10

## 2015-11-23 MED ORDER — POTASSIUM CHLORIDE CRYS ER 20 MEQ PO TBCR
40.0000 meq | EXTENDED_RELEASE_TABLET | ORAL | Status: AC
  Administered 2015-11-23 (×2): 40 meq via ORAL
  Filled 2015-11-23 (×2): qty 2

## 2015-11-23 MED ORDER — TRAMADOL HCL 50 MG PO TABS
50.0000 mg | ORAL_TABLET | Freq: Four times a day (QID) | ORAL | Status: DC | PRN
  Administered 2015-11-23 – 2015-11-26 (×3): 50 mg via ORAL
  Filled 2015-11-23 (×3): qty 1

## 2015-11-23 NOTE — Care Management Important Message (Signed)
Important Message  Patient Details  Name: Kelsey Chung MRN: [id number] Date of Birth: [birth year]   Medicare Important Message Given:  Yes    [name] A Deagen Krass 11/23/2015, 2:28 PM

## 2015-11-23 NOTE — Progress Notes (Signed)
   11/23/15 1008  Aerosol Therapy Tx  Medications Atrovent;Xopenex  Delivery Source Oxygen  Delivery Device HHN  Pre-Treatment Pulse 108  Pre-Treatment Respirations 20  Treatment Tolerance Tolerated well  Treatment Given 1  RT Breath Sounds  Bilateral Breath Sounds Clear;Diminished (upper airway wheeze in the patients neck. )  Oxygen Therapy/Pulse Ox  O2 Device Nasal Cannula  O2 Therapy Oxygen  O2 Flow Rate (L/min) 4 L/min  Patients lungs are mostly clear up top with lower lobes being diminished.  Auscultation of the patients neck reveals source of wheezing.  Possibly due to edema or her coughing for most of the morning.

## 2015-11-23 NOTE — Telephone Encounter (Signed)
90 day rx request for Gabapentin 600mg  received.  Gabapentin not prescribed by Dr. . Return fax.

## 2015-11-23 NOTE — Progress Notes (Addendum)
[employer] [facility] [place] [facility]   PATIENT NAME: Kelsey Chung    MR#:  [id number]  DATE OF BIRTH:  March 08, 1924  SUBJECTIVE:  CHIEF COMPLAINT:   Chief Complaint  Patient presents with  . Weakness  . Code Sepsis  sitting in chair, feels somewhat better. HR up  REVIEW OF SYSTEMS:   GENERAL: 82 y.o.-year-old patient lying in the bed with no acute distress.  EYES: Pupils equal, round, reactive to light and accommodation. No scleral icterus. Extraocular muscles intact.  HEENT: Head atraumatic, normocephalic. Oropharynx and nasopharynx clear.  NECK: Supple, no jugular venous distention. No thyroid enlargement, no tenderness.  LUNGS: Normal breath sounds bilaterally, no wheezing, rales,rhonchi or crepitation. No use of accessory muscles of respiration.  CARDIOVASCULAR: S1, S2 normal. No murmurs, rubs, or gallops.  ABDOMEN: Soft, nontender, nondistended. Bowel sounds present. No organomegaly or mass.  EXTREMITIES: No pedal edema, cyanosis, or clubbing.  NEUROLOGIC: Patient is sleepy, but easily arousable, and follows simple commands. PSYCHIATRIC: The patient is alert and oriented .  SKIN: No obvious rash, lesion, or ulcer.   ROS  DRUG ALLERGIES:   Allergies  Allergen Reactions  . Augmentin [Amoxicillin-Pot Clavulanate] Anaphylaxis and Other (See Comments)    Has patient had a PCN reaction causing immediate rash, facial/tongue/throat swelling, SOB or lightheadedness with hypotension: Yes Has patient had a PCN reaction causing severe rash involving mucus membranes or skin necrosis: No Has patient had a PCN reaction that required hospitalization No Has patient had a PCN reaction occurring within the last 10 years: Yes If all of the above answers are "NO", then may proceed with Cephalosporin use.  . Beta Adrenergic Blockers Other (See Comments)    Reaction:  Unknown  . Clindamycin Other (See Comments)    Reaction:  Redness to face/swollen eyes   .  Excedrin Extra Strength [Aspirin-Acetaminophen-Caffeine] Diarrhea  . Keflex [Cephalexin] Swelling and Other (See Comments)    Reaction:  Lip swelling   . Meloxicam Other (See Comments)    Reaction:  Decreased renal function  . Methotrexate Sodium Other (See Comments)    Reaction:  Decreased renal function  . Nsaids Other (See Comments)    Reaction:  Unknown   . Quinine Rash  . Quinolones Rash    VITALS:  Blood pressure 172/66, pulse 105, temperature 100.1 F (37.8 C), temperature source Oral, resp. rate 20, height 4\' 11"  (1.499 m), weight 83.19 kg (183 lb 6.4 oz), SpO2 95 %. PHYSICAL EXAMINATION:  GENERAL:  82 y.o.-year-old patient lying in the bed with no acute distress.  EYES: Pupils equal, round, reactive to light and accommodation. No scleral icterus. Extraocular muscles intact.  HEENT: Head atraumatic, normocephalic. Oropharynx and nasopharynx clear.  NECK:  Supple, no jugular venous distention. No thyroid enlargement, no tenderness.  LUNGS: Normal breath sounds bilaterally, no wheezing, rales,rhonchi or crepitation. No use of accessory muscles of respiration.  CARDIOVASCULAR: S1, S2 normal. No murmurs, rubs, or gallops.  ABDOMEN: Soft, nontender, nondistended. Bowel sounds present. No organomegaly or mass.  EXTREMITIES: No pedal edema, cyanosis, or clubbing.  NEUROLOGIC: Cranial nerves II through XII are intact. Muscle strength 5/5 in all extremities. Sensation intact. Gait not checked.  PSYCHIATRIC: The patient is alert and oriented x 3.  SKIN: No obvious rash, lesion, or ulcer.   Physical Exam LABORATORY PANEL:   CBC  Recent Labs Lab 11/23/15 0028  WBC 2.0*  HGB 8.0*  HCT 24.6*  PLT 235   ------------------------------------------------------------------------------------------------------------------  Chemistries   Recent Labs Lab 11/21/15  1950  11/23/15 0028  NA 139  < > 141  K 4.5  < > 3.3*  CL 107  < > 113*  CO2 22  < > 19*  GLUCOSE 167*  < > 141*   BUN 29*  < > 30*  CREATININE 1.72*  < > 1.49*  CALCIUM 9.0  < > 8.1*  AST 25  --   --   ALT 20  --   --   ALKPHOS 113  --   --   BILITOT 0.3  --   --   < > = values in this interval not displayed. ------------------------------------------------------------------------------------------------------------------  Cardiac Enzymes  Recent Labs Lab 11/22/15 0336 11/22/15 0821  TROPONINI 0.23* 0.23*   ------------------------------------------------------------------------------------------------------------------  RADIOLOGY:  Dg Chest 1 View  11/22/2015  CLINICAL DATA:  Acute onset of generalized weakness. Code sepsis. Initial encounter. EXAM: CHEST 1 VIEW COMPARISON:  Chest radiograph performed 11/21/2015 FINDINGS: The lungs are well-aerated. Mild bibasilar airspace opacities may reflect mild pneumonia. There is no evidence of pleural effusion or pneumothorax. The cardiomediastinal silhouette is enlarged. No acute osseous abnormalities are seen. IMPRESSION: 1. Mild bibasilar opacities may reflect mild pneumonia. 2. Cardiomegaly. Electronically Signed   By: Roanna Raider M.D.   On: 11/22/2015 18:32   Dg Chest Port 1 View  11/21/2015  CLINICAL DATA:  Family states pt ate some ice cream tonight then went to the bathroom and became weak with uncontrolled shaking. New onset of Afib. History of HTN, stroke, diabetic EXAM: PORTABLE CHEST 1 VIEW COMPARISON:  01/11/2014 FINDINGS: Cardiac silhouette is mildly enlarged. No mediastinal or hilar masses or evidence of adenopathy. Clear lungs. No pleural effusion or pneumothorax. The bony thorax is demineralized but grossly intact. IMPRESSION: No acute cardiopulmonary disease. Electronically Signed   By: Amie Portland M.D.   On: 11/21/2015 20:05    ASSESSMENT AND PLAN:   Principal Problem:   Sepsis (HCC) Active Problems:   Essential (primary) hypertension   CKD (chronic kidney disease) stage 4, GFR 15-29 ml/min (HCC)   UTI (lower urinary tract  infection)   Atrial fibrillation with rapid ventricular response (HCC)   Demand myocardial infarction (HCC) - Type II MI in setting of Sepsis   New onset atrial fibrillation (HCC)   Sepsis secondary to UTI (HCC)  * Sepsis secondary to UTI - blood c/s growing MRSA (1/4 bottle)  Aztreonam due to multiple Abx allergies.  Urine culture is sent by ER physician I will wait on that result to adjust to oral antibiotics. (urine c/s from cone shows no growth)  IV fluids to support for sepsis.  * Atrial fibrillation with rapid ventricular response  This is likely secondary to sepsis, this is new onset.  Continue monitoring for now on telemetry   This should be resolved once the sepsis is resolved.   Appreciated cardiology help  Increased dose of cardizem, await echo read, stopped clonidine - will need long term anticoagulation (NOAC), on heparin drip for now. May convert tomorrow per cardio.  * Chronic renal failure  Stable, at baseline.  * Hyperlipidemia  Continue statin.  * Arthritis  Continue home medications.  * Diabetes  Hold oral medication has patient has altered mental status for now, keep on insulin sliding scale coverage.  * Anemia of chronic dz: stable Hb 8.0, monitor while on full dose anticoagulation    All the records are reviewed and case discussed with Care Management/Social Workerr. Management plans discussed with the patient, family and they are in agreement.  CODE STATUS: full  TOTAL TIME TAKING CARE OF THIS PATIENT: 35 minutes.    POSSIBLE D/C IN 1-2 DAYS, DEPENDING ON CLINICAL CONDITION.   [facility], Lashonna Rieke M.D on 11/23/2015   Between 7am to 6pm - Pager - [phone]  After 6pm go to [email or web address] - password [email or web address]  [facility] [place] [facility]  Office  [phone]  CC: Primary care physician; Barbette Reichmann, MD  Note: This dictation was prepared with Dragon dictation along with smaller phrase technology. Any transcriptional errors that  result from this process are unintentional.

## 2015-11-23 NOTE — Progress Notes (Addendum)
Pt's potassium 3.3, MD Dr. Sheryle Hail notified, orders given for PO potassium replacement as well as restarting fluids for elevated creatinine. MD aware of previous fluid overload, rate of NS adjusted accordingly. Syliva Overman, RN

## 2015-11-23 NOTE — Progress Notes (Signed)
Per NT noon CBG 168, glucometers malfunctioning and not crossing over into EPIC.

## 2015-11-23 NOTE — Plan of Care (Signed)
Problem: Safety: Goal: Ability to remain free from injury will improve Outcome: Progressing Pt high falls, non skid socks in place, bed & chair alarm in use. Pt aware of need to call before getting oob, able to state safety precaution measures.  Problem: Health Behavior/Discharge Planning: Goal: Ability to manage health-related needs will improve Outcome: Progressing Education on new heart medication, cardizem, and blood thinner, heparin provided to both patient and daughter. Family is familiar with atrial fibrillation from other family members having it and they are able to verbalize purpose/function of new medications.   Problem: Pain Managment: Goal: General experience of comfort will improve Outcome: Progressing No reports of pain this shift.  Problem: Physical Regulation: Goal: Ability to maintain clinical measurements within normal limits will improve Outcome: Progressing Patient has been OOB in chair for majority of the shift, +2 assist. Has tolerated very well.  Goal: Will remain free from infection Outcome: Progressing IV abx for UTI  Problem: Tissue Perfusion: Goal: Risk factors for ineffective tissue perfusion will decrease Outcome: Progressing Heparin gtt  Problem: Activity: Goal: Risk for activity intolerance will decrease Outcome: Progressing Patient has been OOB in chair for majority of the shift, +2 assist. Has tolerated very well.   Problem: Nutrition: Goal: Adequate nutrition will be maintained Outcome: Adequate for Discharge Pt eating habits and intake have remained at baseline.

## 2015-11-23 NOTE — Progress Notes (Addendum)
Patient rested quietly throughout the day. OOB to chair with +2 assist, gait belt and walker. O2 titrated down to 3L O2. Heparin gtt infusing, IV cardizem given 1x this AM for HR in 130s. Increased dose of po cardizem given, tolerating well: rate currently 90s-100s, still afib. VSS. Family at bedside periodically throughout the day, updated on plan of care and patient condition. Will continue to monitor.

## 2015-11-23 NOTE — Progress Notes (Signed)
ANTICOAGULATION CONSULT NOTE - Initial Consult  Pharmacy Consult for heparin drip Indication: NSTEMI  Allergies  Allergen Reactions  . Augmentin [Amoxicillin-Pot Clavulanate] Anaphylaxis and Other (See Comments)    Has patient had a PCN reaction causing immediate rash, facial/tongue/throat swelling, SOB or lightheadedness with hypotension: Yes Has patient had a PCN reaction causing severe rash involving mucus membranes or skin necrosis: No Has patient had a PCN reaction that required hospitalization No Has patient had a PCN reaction occurring within the last 10 years: Yes If all of the above answers are "NO", then may proceed with Cephalosporin use.  . Beta Adrenergic Blockers Other (See Comments)    Reaction:  Unknown  . Clindamycin Other (See Comments)    Reaction:  Redness to face/swollen eyes   . Excedrin Extra Strength [Aspirin-Acetaminophen-Caffeine] Diarrhea  . Keflex [Cephalexin] Swelling and Other (See Comments)    Reaction:  Lip swelling   . Meloxicam Other (See Comments)    Reaction:  Decreased renal function  . Methotrexate Sodium Other (See Comments)    Reaction:  Decreased renal function  . Nsaids Other (See Comments)    Reaction:  Unknown   . Quinine Rash  . Quinolones Rash    Patient Measurements: Height: 4\' 11"  (149.9 cm) Weight: 183 lb 6.4 oz (83.19 kg) IBW/kg (Calculated) : 43.2 Heparin Dosing Weight: 62kg   Vital Signs: Temp: 99.5 F (37.5 C) (06/14 2202) Temp Source: Oral (06/14 2202) BP: 123/44 mmHg (06/15 0005) Pulse Rate: 73 (06/15 0005)  Labs:  Recent Labs  11/21/15 1950 11/21/15 2337 11/22/15 0336 11/22/15 0821 11/22/15 1551 11/23/15 0028  HGB 9.9*  --  9.8*  --   --  8.0*  HCT 30.5*  --  29.8*  --   --  24.6*  PLT 280  --  271  --   --  235  APTT 27  --   --   --   --   --   LABPROT 13.7  --   --   --   --   --   INR 1.03  --   --   --   --   --   HEPARINUNFRC  --   --   --   --  0.31 0.35  CREATININE 1.72*  --  1.77*  --   --   1.49*  TROPONINI 0.03 0.13* 0.23* 0.23*  --   --    Estimated Creatinine Clearance: 23.9 mL/min (by C-G formula based on Cr of 1.49).  Medical History: Past Medical History  Diagnosis Date  . Diabetes (HCC)   . Hypertension   . Rheumatoid arthritis(714.0)   . Hyperlipidemia   . Gout   . Osteoarthritis (arthritis due to wear and tear of joints)   . Scoliosis   . Constipation, chronic   . CKD (chronic kidney disease) stage 4, GFR 15-29 ml/min (HCC)   . New onset atrial fibrillation (HCC)     a. diagnosed in 11/2015 - admitted for sepsis  . TIA (transient ischemic attack)     a. multiple TIA's 20+ years ago according to patient's daughter   Assessment: Pharmacy consulted to dose and monitor heparin drip in this 82 year old female for NSTEMI. Patient was not taking anticoagulants prior to admission.   Baseline labs were obtained on admission and are as followed: Hgb 9.9 Plt 280  APTT 27 INR 1.03  First heparin level drawn at 1551 this afternoon is therapeutic at 0.31.   Goal of Therapy:  Heparin level 0.3-0.7 units/ml Monitor platelets by anticoagulation protocol: Yes   Plan:  Continue heparin drip at current rate of 750 units/hr. Will order confirmatory heparin level in 8 hours. CBC ordered with AM labs tomorrow.  6/15 00:30 heparin level 0.35. Continue current regimen. Recheck heparin level and CBC with tomorrow AM labs.  Pharmacy will continue to monitor.  Erich Montane, PharmD Clinical Pharmacist 11/23/2015,1:43 AM

## 2015-11-23 NOTE — Care Management (Signed)
Barrier to progression/discharge: heart rate contorl, tmep, acute 02 needs, heparin drip

## 2015-11-23 NOTE — Progress Notes (Signed)
Pharmacy Antibiotic Note  Kelsey Chung is a 82 y.o. female admitted on 11/21/2015 with sepsis.  Pharmacy has been consulted for aztreonam dosing. Patient has multiple antibiotic allergies with anaphylaxis listed to penicillins and lip swelling to cephalosporins.   Plan: Patient was ordered aztreonam 2 g for one dose in ED Will follow with aztreonam 1 g IV q8h per indication and renal function  6/14 Lab called with BC[facility] of Staph. Spp. MecA(+). Restarting vancomycin per conversation with hospitalist.  DW 59kg  Vd 43L kei 0.021 hr-1  T1/2 33 hours Vancomycin 750 mg q 36 hours ordered. Will not order stacked dosing since patient received a dose in ED 6/13. Level before 4th overall dose. Goal trough 15-20.  Height: 4\' 11"  (149.9 cm) Weight: 183 lb 6.4 oz (83.19 kg) IBW/kg (Calculated) : 43.2  Temp (24hrs), Avg:99.7 F (37.6 C), Min:98.2 F (36.8 C), Max:101.1 F (38.4 C)   Recent Labs Lab 11/21/15 1950 11/21/15 2337 11/22/15 0336  WBC 5.5  --  3.3*  CREATININE 1.72*  --  1.77*  LATICACIDVEN 3.1* 2.4*  --     Estimated Creatinine Clearance: 20.1 mL/min (by C-G formula based on Cr of 1.77).    Allergies  Allergen Reactions  . Augmentin [Amoxicillin-Pot Clavulanate] Anaphylaxis and Other (See Comments)    Has patient had a PCN reaction causing immediate rash, facial/tongue/throat swelling, SOB or lightheadedness with hypotension: Yes Has patient had a PCN reaction causing severe rash involving mucus membranes or skin necrosis: No Has patient had a PCN reaction that required hospitalization No Has patient had a PCN reaction occurring within the last 10 years: Yes If all of the above answers are "NO", then may proceed with Cephalosporin use.  . Beta Adrenergic Blockers Other (See Comments)    Reaction:  Unknown  . Clindamycin Other (See Comments)    Reaction:  Redness to face/swollen eyes   . Excedrin Extra Strength [Aspirin-Acetaminophen-Caffeine] Diarrhea  . Keflex  [Cephalexin] Swelling and Other (See Comments)    Reaction:  Lip swelling   . Meloxicam Other (See Comments)    Reaction:  Decreased renal function  . Methotrexate Sodium Other (See Comments)    Reaction:  Decreased renal function  . Nsaids Other (See Comments)    Reaction:  Unknown   . Quinine Rash  . Quinolones Rash   Antimicrobials this admission: vancomycin ED dose on 6/13 aztreonam 6/13 >>   Dose adjustments this admission:  Microbiology results: 6/13 BCx: Sent 6/13 UCx: Sent   Thank you for allowing pharmacy to be a part of this patient's care.  7/13, PharmD Clinical Pharmacist 11/23/2015 12:15 AM

## 2015-11-23 NOTE — Progress Notes (Signed)
Physical Therapy Treatment Patient Details Name: Kelsey Chung MRN: [id number] DOB: [birth year] Today's Date: 11/23/2015    History of Present Illness [name]. Pt with elevated troponin, however cleared by cardio as demand ischemia. Pt with complaints of weakness. Pt with history of DM, HTN, GERD, OA, and CVA.     PT Comments    Pt is making good progress towards goals with ability to ambulate to recliner. Daughter present for therapy session and reports pt is close to baseline status and sleeps in lift chair. Agreeable to HHPT recommendations. HR elevates with exertion, further there-ex deferred. O2 weaned to 2L and used for all mobility this date.  Follow Up Recommendations  Home health PT;Supervision/Assistance - 24 hour     Equipment Recommendations       Recommendations for Other Services       Precautions / Restrictions Precautions Precautions: Fall Restrictions Weight Bearing Restrictions: No    Mobility  Bed Mobility Overal bed mobility: Needs Assistance Bed Mobility: Supine to Sit     Supine to sit: Min assist     General bed mobility comments: assist for trunk stability. Once seated at EOB, pt able to sit with cga.  Pt with increased lateral sway noted with seated balance, however improved over the course of 2-3 mintues.  Transfers Overall transfer level: Needs assistance Equipment used: Rolling walker (2 wheeled) Transfers: Sit to/from Stand Sit to Stand: Min assist         General transfer comment: +2 assist for transfer as pt has difficulty keeping B knees flexed in order to stand on feet. Required therapist to block foot from sliding out. Once standing, pt able to stand with cga  Ambulation/Gait Ambulation/Gait assistance: Min guard Ambulation Distance (Feet): 3 Feet Assistive device: Rolling walker (2 wheeled) Gait Pattern/deviations: Step-to pattern     General Gait Details: ambulated using rw and cga. Pt with  slow steady technique to recliner with step to gait pattern. Pt fatigues once seated in chair. All mobility performed on 2L of O2 with sats at 91%. HR elevated to 149 with exertion. Further mobility deferred at this time as RN given cardio HR meds   Stairs            Wheelchair Mobility    Modified Rankin (Stroke Patients Only)       Balance                                    Cognition Arousal/Alertness: Awake/alert Behavior During Therapy: WFL for tasks assessed/performed Overall Cognitive Status: Within Functional Limits for tasks assessed                      Exercises Other Exercises Other Exercises: deferred secondary to elevated HR    General Comments        Pertinent Vitals/Pain Pain Assessment: No/denies pain    Home Living                      Prior Function            PT Goals (current goals can now be found in the care plan section) Acute Rehab PT Goals Patient Stated Goal: to get stronger PT Goal Formulation: With patient Time For Goal Achievement: 12/06/15 Potential to Achieve Goals: Good Progress towards PT goals: Progressing toward goals    Frequency  Min  2X/week    PT Plan Current plan remains appropriate    Co-evaluation             End of Session Equipment Utilized During Treatment: Gait belt;Oxygen Activity Tolerance: Patient tolerated treatment well Patient left: in chair;with chair alarm set     Time: 6060-0459 PT Time Calculation (min) (ACUTE ONLY): 14 min  Charges:  $Gait Training: 8-22 mins                    G Codes:      Johnothan Bascomb 09-Dec-2015, 10:59 AM  Elizabeth Palau, PT, DPT [phone]

## 2015-11-23 NOTE — Progress Notes (Signed)
Patient: Kelsey Chung / Admit Date: 11/21/2015 / Date of Encounter: 11/23/2015, 7:33 AM   Subjective: Some confusion as patient did not know "anything was wrong" prior to her admission. She remains in Afib with RVR with heart rates in the low 100's bom up to the 1-teens bpm. Improved from 6/14 when heart rates where in the 120's to 140's bpm. She remains on oxygen via nasal cannula. Potassium 3.3 this morning. Has started repletion. Was somewhat volume overloaded on 6/14 requiring IV Lasix x 1. Has not been getting any nebs, still with significant wheezing.   Review of Systems: Review of Systems  Constitutional: Positive for fever, chills, weight loss and malaise/fatigue. Negative for diaphoresis.  HENT: Negative for congestion.   Eyes: Negative for discharge and redness.  Respiratory: Positive for cough, shortness of breath and wheezing. Negative for hemoptysis and sputum production.   Cardiovascular: Negative for chest pain, palpitations, orthopnea, claudication, leg swelling and PND.  Gastrointestinal: Negative for heartburn, nausea, vomiting and abdominal pain.  Musculoskeletal: Positive for falls.       2 mechanical falls in the past 12 months, most recently 2 months prior to this admission   Skin: Negative for rash.  Neurological: Positive for weakness. Negative for dizziness, sensory change, speech change, focal weakness and loss of consciousness.  Endo/Heme/Allergies: Does not bruise/bleed easily.  Psychiatric/Behavioral: Negative for substance abuse. The patient is not nervous/anxious.   All other systems reviewed and are negative.   Objective: Telemetry: Afib with RVR with heart rates in the low 100's to 1-teens currently Physical Exam: Blood pressure 160/55, pulse 71, temperature 98.2 F (36.8 C), temperature source Axillary, resp. rate 20, height 4\' 11"  (1.499 m), weight 183 lb 6.4 oz (83.19 kg), SpO2 99 %. Body mass index is 37.02 kg/(m^2). General: Well developed,  well nourished, in no acute distress. Head: Normocephalic, atraumatic, sclera non-icteric, no xanthomas, nares are without discharge. Neck: Negative for carotid bruits. JVP not elevated. Lungs: Decreased breath sounds bilaterally with diffuse expiratory wheezing. Breathing is unlabored. Heart: Irregularly-irregular S1 S2 II/VI systolic murmur, no rubs, or gallops.  Abdomen: Soft, non-tender, non-distended with normoactive bowel sounds. No rebound/guarding. Extremities: No clubbing or cyanosis. No edema. Distal pedal pulses are 2+ and equal bilaterally. Neuro: Alert and oriented X 3. Moves all extremities spontaneously. Psych:  Responds to questions appropriately with a normal affect.   Intake/Output Summary (Last 24 hours) at 11/23/15 0733 Last data filed at 11/23/15 0650  Gross per 24 hour  Intake   1134 ml  Output      0 ml  Net   1134 ml    Inpatient Medications:  . allopurinol  100 mg Oral BID  . aspirin EC  81 mg Oral Daily  . aztreonam  1 g Intravenous Q8H  . cholecalciferol  1,000 Units Oral Daily  . cloNIDine  0.1 mg Oral BID  . diltiazem  30 mg Oral Q6H  . ferrous sulfate  325 mg Oral Q breakfast  . gabapentin  600 mg Oral TID  . hydroxychloroquine  200 mg Oral Daily  . insulin aspart  0-9 Units Subcutaneous TID WC  . isosorbide dinitrate  10 mg Oral TID  . lactulose  20 g Oral BID  . losartan  50 mg Oral Daily  . omega-3 acid ethyl esters  1 g Oral BID  . pantoprazole  40 mg Oral Daily  . potassium chloride  40 mEq Oral Q4H  . rOPINIRole  0.5 mg Oral QHS  .  simvastatin  20 mg Oral QHS  . sodium chloride flush  3 mL Intravenous Q12H  . vancomycin  750 mg Intravenous Q36H  . vitamin B-12  1,000 mcg Oral Daily  . vitamin C  500 mg Oral Daily   Infusions:  . sodium chloride 75 mL/hr at 11/23/15 0644  . heparin 750 Units/hr (11/22/15 1008)    Labs:  Recent Labs  11/22/15 0336 11/23/15 0028  NA 141 141  K 4.4 3.3*  CL 112* 113*  CO2 19* 19*  GLUCOSE 129*  141*  BUN 29* 30*  CREATININE 1.77* 1.49*  CALCIUM 8.4* 8.1*    Recent Labs  11/21/15 1950  AST 25  ALT 20  ALKPHOS 113  BILITOT 0.3  PROT 6.9  ALBUMIN 3.6    Recent Labs  11/21/15 1950 11/22/15 0336 11/23/15 0028  WBC 5.5 3.3* 2.0*  NEUTROABS 4.4  --   --   HGB 9.9* 9.8* 8.0*  HCT 30.5* 29.8* 24.6*  MCV 106.8* 104.7* 103.1*  PLT 280 271 235    Recent Labs  11/21/15 1950 11/21/15 2337 11/22/15 0336 11/22/15 0821  TROPONINI 0.03 0.13* 0.23* 0.23*   Invalid input(s): POCBNP No results for input(s): HGBA1C in the last 72 hours.   Weights: Filed Weights   11/21/15 2004 11/21/15 2307  Weight: 176 lb (79.833 kg) 183 lb 6.4 oz (83.19 kg)     Radiology/Studies:  Dg Chest 1 View  11/22/2015  IMPRESSION: 1. Mild bibasilar opacities may reflect mild pneumonia. 2. Cardiomegaly. Electronically Signed   By: Roanna Raider M.D.   On: 11/22/2015 18:32   Dg Chest Port 1 View  11/21/2015  IMPRESSION: No acute cardiopulmonary disease. Electronically Signed   By: Amie Portland M.D.   On: 11/21/2015 20:05     Assessment and Plan  Principal Problem:   Sepsis (HCC) Active Problems:   Essential (primary) hypertension   CKD (chronic kidney disease) stage 4, GFR 15-29 ml/min (HCC)   UTI (lower urinary tract infection)   Atrial fibrillation with rapid ventricular response (HCC)   Demand myocardial infarction (HCC) - Type II MI in setting of Sepsis   New onset atrial fibrillation (HCC)   Sepsis secondary to UTI (HCC)    1.New onset Afib with RVR: -Likely in the setting of the patient's sepsis 2/2 UTI. Also some pulmonary strain and volume overload  -Would continue rate control with increasing short-acting diltiazem to 60 mg q 8 hours (plenty of BP room currently) -Heart rate is improving, as it continues to do so would plan for echo to evaluate LVSF. If LVSF is depressed would need to have discussion with the patient regarding transition to BB (reported allergy, with  unknown adverse effect) -For now continue heparin gtt with plan to change to DOAC in the next day or so, pending echo read -CHADS2VASc at least 7 (HTN, age x 2, DM, TIA x 2, female) giving her an estimated annual stroke risk of 11.2%  2. Volume overload/SOB/PNA: -Has been receiving IV fluids at 75 mL/hr, will stop these at this time -Required IV Lasix x 1 on 6/14 -Has not been receiving any nebs, will start Xopenex and Atrovent nebs -Consider steroids -ABX per IM  3. Sepsis 2/2 UTI and possible PNA: -As above -Per IM  4. Hypokalemia: -On repletion this morning to a goal of 4.0  5. Accelerated HTN: -Increased short-acting diltiazem as above -Plan to taper off clonidine which would allow for further titration of rate controlling medication -For now continue  current antihypertensives given BP  6. Elevated troponin: -Never with any angina -Peaked at 0.23 -Echo as above -At this time no plans for ischemic evaluation, pending echo -On heparin gtt for #1, not ACS. Will be able to transition to DOAC once echo is back  7. CKD stage IV: -Stable -Limit nephrotoxic agents -Per IM  8. HLD: -Statin  Signed, Eula Listen, PA-C [facility] Pager: [phone] 11/23/2015, 7:33 AM

## 2015-11-24 LAB — URINALYSIS COMPLETE WITH MICROSCOPIC (ARMC ONLY)
BILIRUBIN URINE: NEGATIVE
GLUCOSE, UA: NEGATIVE mg/dL
Hgb urine dipstick: NEGATIVE
Ketones, ur: NEGATIVE mg/dL
Nitrite: NEGATIVE
Protein, ur: 100 mg/dL — AB
Specific Gravity, Urine: 1.015 (ref 1.005–1.030)
pH: 5 (ref 5.0–8.0)

## 2015-11-24 LAB — CBC
HEMATOCRIT: 26.4 % — AB (ref 35.0–47.0)
Hemoglobin: 8.7 g/dL — ABNORMAL LOW (ref 12.0–16.0)
MCH: 34.2 pg — ABNORMAL HIGH (ref 26.0–34.0)
MCHC: 32.8 g/dL (ref 32.0–36.0)
MCV: 104.1 fL — AB (ref 80.0–100.0)
PLATELETS: 291 10*3/uL (ref 150–440)
RBC: 2.54 MIL/uL — ABNORMAL LOW (ref 3.80–5.20)
RDW: 19 % — AB (ref 11.5–14.5)
WBC: 4.2 10*3/uL (ref 3.6–11.0)

## 2015-11-24 LAB — GLUCOSE, CAPILLARY
GLUCOSE-CAPILLARY: 143 mg/dL — AB (ref 65–99)
GLUCOSE-CAPILLARY: 153 mg/dL — AB (ref 65–99)
GLUCOSE-CAPILLARY: 134 mg/dL — AB (ref 65–99)
Glucose-Capillary: 183 mg/dL — ABNORMAL HIGH (ref 65–99)

## 2015-11-24 LAB — HEPARIN LEVEL (UNFRACTIONATED): HEPARIN UNFRACTIONATED: 0.22 [IU]/mL — AB (ref 0.30–0.70)

## 2015-11-24 MED ORDER — SULFAMETHOXAZOLE-TRIMETHOPRIM 800-160 MG PO TABS
1.0000 | ORAL_TABLET | Freq: Every day | ORAL | Status: DC
  Administered 2015-11-24 – 2015-11-26 (×3): 1 via ORAL
  Filled 2015-11-24 (×3): qty 1

## 2015-11-24 MED ORDER — APIXABAN 2.5 MG PO TABS
2.5000 mg | ORAL_TABLET | Freq: Two times a day (BID) | ORAL | Status: DC
  Administered 2015-11-24 – 2015-11-26 (×5): 2.5 mg via ORAL
  Filled 2015-11-24 (×5): qty 1

## 2015-11-24 MED ORDER — DILTIAZEM HCL 60 MG PO TABS
90.0000 mg | ORAL_TABLET | Freq: Three times a day (TID) | ORAL | Status: DC
  Administered 2015-11-24 – 2015-11-25 (×3): 90 mg via ORAL
  Filled 2015-11-24 (×3): qty 1

## 2015-11-24 NOTE — Consult Note (Signed)
Pharmacy Antibiotic Note  Kelsey Chung is a 82 y.o. female admitted on 11/21/2015 with UTI.  Pharmacy has been consulted for bactrim dosing.  Plan: Will initiate bactrim DS PO once daily due to patient's renal function <30 ml/min.    Height: 4\' 11"  (149.9 cm) Weight: 183 lb 6.4 oz (83.19 kg) IBW/kg (Calculated) : 43.2  Temp (24hrs), Avg:98.3 F (36.8 C), Min:97.8 F (36.6 C), Max:98.7 F (37.1 C)   Recent Labs Lab 11/21/15 1950 11/21/15 2337 11/22/15 0336 11/23/15 0028 11/24/15 0552  WBC 5.5  --  3.3* 2.0* 4.2  CREATININE 1.72*  --  1.77* 1.49*  --   LATICACIDVEN 3.1* 2.4*  --   --   --     Estimated Creatinine Clearance: 23.9 mL/min (by C-G formula based on Cr of 1.49).    Allergies  Allergen Reactions  . Augmentin [Amoxicillin-Pot Clavulanate] Anaphylaxis and Other (See Comments)    Has patient had a PCN reaction causing immediate rash, facial/tongue/throat swelling, SOB or lightheadedness with hypotension: Yes Has patient had a PCN reaction causing severe rash involving mucus membranes or skin necrosis: No Has patient had a PCN reaction that required hospitalization No Has patient had a PCN reaction occurring within the last 10 years: Yes If all of the above answers are "NO", then may proceed with Cephalosporin use.  . Beta Adrenergic Blockers Other (See Comments)    Reaction:  Unknown  . Clindamycin Other (See Comments)    Reaction:  Redness to face/swollen eyes   . Excedrin Extra Strength [Aspirin-Acetaminophen-Caffeine] Diarrhea  . Keflex [Cephalexin] Swelling and Other (See Comments)    Reaction:  Lip swelling   . Meloxicam Other (See Comments)    Reaction:  Decreased renal function  . Methotrexate Sodium Other (See Comments)    Reaction:  Decreased renal function  . Nsaids Other (See Comments)    Reaction:  Unknown   . Quinine Rash  . Quinolones Rash   Microbiology results: 6/13 BCx: GPCs in blood 6/13 UCx: NGTD  Thank you for allowing pharmacy to  be a part of this patient's care.  7/13 11/24/2015 4:27 PM

## 2015-11-24 NOTE — Progress Notes (Addendum)
Alert and oriented. No complaints of pain this shift. Heart rate has sustained in the low 100's since patient has not gotten up to use the bedside commode, does occasionally rise to 120 while on bedpan. Started on eliquis today and is taking 60mg  cardizem every 8 hours. Daughter has been at bedside all day. Patient has had a little bit of blood on her tissues blowing her nose, not enough to be concerned for a drop in hemoglobin. Patient informed to notify if bleeding gets worse.

## 2015-11-24 NOTE — Progress Notes (Signed)
Patient's heart rate went as high as 140's while up to the bedside commode. Took patient about 20 minutes to come down from the 120's after lying down. Patient was asymptomatic but did say she had a coughing spell right after getting back in bed that made her short of breath. Dr. Amado Coe on unit and notified. Heart rate is now back down to the low 100's. Will continue to monitor.

## 2015-11-24 NOTE — Progress Notes (Signed)
Patient: Kelsey Chung / Admit Date: 11/21/2015 / Date of Encounter: 11/24/2015, 8:55 AM   Subjective: She is more alert today. She continues to be in atrial fibrillation. Ventricular rate is more controlled but continues to be above 100.  Review of Systems: Review of Systems  Constitutional: Positive for fever, chills, weight loss and malaise/fatigue. Negative for diaphoresis.  HENT: Negative for congestion.   Eyes: Negative for discharge and redness.  Respiratory: Positive for cough, shortness of breath and wheezing. Negative for hemoptysis and sputum production.   Cardiovascular: Negative for chest pain, palpitations, orthopnea, claudication, leg swelling and PND.  Gastrointestinal: Negative for heartburn, nausea, vomiting and abdominal pain.  Musculoskeletal: Positive for falls.       2 mechanical falls in the past 12 months, most recently 2 months prior to this admission   Skin: Negative for rash.  Neurological: Positive for weakness. Negative for dizziness, sensory change, speech change, focal weakness and loss of consciousness.  Endo/Heme/Allergies: Does not bruise/bleed easily.  Psychiatric/Behavioral: Negative for substance abuse. The patient is not nervous/anxious.   All other systems reviewed and are negative.   Objective: Telemetry: Afib with RVR with heart rates in the low 100's to 1-teens currently Physical Exam: Blood pressure 148/58, pulse 91, temperature 97.8 F (36.6 C), temperature source Oral, resp. rate 16, height 4\' 11"  (1.499 m), weight 183 lb 6.4 oz (83.19 kg), SpO2 98 %. Body mass index is 37.02 kg/(m^2). General: Well developed, well nourished, in no acute distress. Head: Normocephalic, atraumatic, sclera non-icteric, no xanthomas, nares are without discharge. Neck: Negative for carotid bruits. JVP not elevated. Lungs: Decreased breath sounds bilaterally with diffuse expiratory wheezing. Breathing is unlabored. Heart: Irregularly-irregular S1 S2 II/VI  systolic murmur, no rubs, or gallops.  Abdomen: Soft, non-tender, non-distended with normoactive bowel sounds. No rebound/guarding. Extremities: No clubbing or cyanosis. No edema. Distal pedal pulses are 2+ and equal bilaterally. Neuro: Alert and oriented X 3. Moves all extremities spontaneously. Psych:  Responds to questions appropriately with a normal affect.   Intake/Output Summary (Last 24 hours) at 11/24/15 0855 Last data filed at 11/24/15 0853  Gross per 24 hour  Intake  604.5 ml  Output    450 ml  Net  154.5 ml    Inpatient Medications:  . allopurinol  100 mg Oral BID  . apixaban  2.5 mg Oral BID  . aztreonam  1 g Intravenous Q8H  . cholecalciferol  1,000 Units Oral Daily  . diltiazem  90 mg Oral Q8H  . ferrous sulfate  325 mg Oral Q breakfast  . gabapentin  600 mg Oral TID  . hydroxychloroquine  200 mg Oral Daily  . insulin aspart  0-9 Units Subcutaneous TID WC  . isosorbide dinitrate  10 mg Oral TID  . lactulose  20 g Oral BID  . losartan  50 mg Oral Daily  . omega-3 acid ethyl esters  1 g Oral BID  . pantoprazole  40 mg Oral Daily  . rOPINIRole  0.5 mg Oral QHS  . simvastatin  20 mg Oral QHS  . sodium chloride flush  3 mL Intravenous Q12H  . vancomycin  750 mg Intravenous Q36H  . vitamin B-12  1,000 mcg Oral Daily  . vitamin C  500 mg Oral Daily   Infusions:     Labs:  Recent Labs  11/22/15 0336 11/23/15 0028  NA 141 141  K 4.4 3.3*  CL 112* 113*  CO2 19* 19*  GLUCOSE 129* 141*  BUN  29* 30*  CREATININE 1.77* 1.49*  CALCIUM 8.4* 8.1*    Recent Labs  11/21/15 1950  AST 25  ALT 20  ALKPHOS 113  BILITOT 0.3  PROT 6.9  ALBUMIN 3.6    Recent Labs  11/21/15 1950  11/23/15 0028 11/24/15 0552  WBC 5.5  < > 2.0* 4.2  NEUTROABS 4.4  --   --   --   HGB 9.9*  < > 8.0* 8.7*  HCT 30.5*  < > 24.6* 26.4*  MCV 106.8*  < > 103.1* 104.1*  PLT 280  < > 235 291  < > = values in this interval not displayed.  Recent Labs  11/21/15 1950  11/21/15 2337 11/22/15 0336 11/22/15 0821  TROPONINI 0.03 0.13* 0.23* 0.23*   Invalid input(s): POCBNP No results for input(s): HGBA1C in the last 72 hours.   Weights: Filed Weights   11/21/15 2004 11/21/15 2307  Weight: 176 lb (79.833 kg) 183 lb 6.4 oz (83.19 kg)     Radiology/Studies:  Dg Chest 1 View  11/22/2015  IMPRESSION: 1. Mild bibasilar opacities may reflect mild pneumonia. 2. Cardiomegaly. Electronically Signed   By: Roanna Raider M.D.   On: 11/22/2015 18:32   Dg Chest Port 1 View  11/21/2015  IMPRESSION: No acute cardiopulmonary disease. Electronically Signed   By: Amie Portland M.D.   On: 11/21/2015 20:05     Assessment and Plan  Principal Problem:   Sepsis (HCC) Active Problems:   Essential (primary) hypertension   CKD (chronic kidney disease) stage 4, GFR 15-29 ml/min (HCC)   UTI (lower urinary tract infection)   Atrial fibrillation with rapid ventricular response (HCC)   Demand myocardial infarction (HCC) - Type II MI in setting of Sepsis   New onset atrial fibrillation (HCC)   Sepsis secondary to UTI (HCC)    1.New onset Afib with RVR: -Likely in the setting of the patient's sepsis 2/2 UTI.  - I increase diltiazem to 90 mg 3 times daily. This can be constipated to long-acting before hospital discharge. -CHADS2VASc at least 7 (HTN, age x 2, DM, TIA x 2, female) giving her an estimated annual stroke risk of 11.2%. . I stopped heparin and started Eliquis 2.5 mg twice daily. This dose was chosen given her age and the fact that she has chronic kidney disease with creatinine mostly above 1.5. The patient probably needs one more day in the hospital to ensure optimal control without significant bradycardia.  2. Volume overload/SOB/PNA: - She is gradually improving.  3. Sepsis 2/2 UTI and possible PNA: -As above -Per IM  4. Hypokalemia: -On repletion this morning to a goal of 4.0  5. Accelerated HTN: -  clonidine was discontinued and diltiazem was  increased.   6. Moderate aortic stenosis: This was noted on echocardiogram. Ejection fraction was 50-55%. This can be monitored.   Signed, Lorine Bears, MD  11/24/2015, 8:55 AM

## 2015-11-24 NOTE — Progress Notes (Signed)
Patient got up to bedside commode again and heart rate stayed in the 130's to 140's while up. Patient has some dyspnea on exertion, but does not seem to notice her heart beating fast. Dr. Amado Coe on unit consulted about patient getting up and MD stated she should use the bedpan if heart rate continues to rise. Will use bedpan next time and continue to monitor.

## 2015-11-24 NOTE — Progress Notes (Addendum)
[employer] [facility] - [place] at [facility]   PATIENT NAME: Kelsey Chung    MR#:  [id number]  DATE OF BIRTH:  Apr 13, 1924  SUBJECTIVE:  CHIEF COMPLAINT:   Chief Complaint  Patient presents with  . Weakness  . Code Sepsis    Patient is reporting palpitations and fast pulse. Denies any dizziness or loss of consciousness. Dois Davenport  daughter at bedside REVIEW OF SYSTEMS:     ROS   General: No chills, fever, night sweats or weight changes. Reporting generalized weakness HEENT-denies any blurry vision or tinnitus Cardiovascular: No chest pain, dyspnea on exertion, edema, orthopnea, palpitations, paroxysmal nocturnal dyspnea.  Dermatological: No rash, lesions/masses  Respiratory: No cough, dyspnea  Urologic: No hematuria, dysuria  Abdominal: No nausea, vomiting, diarrhea, bright red blood per rectum, melena, or hematemesis. .  Neurologic: No visual changes, changes in mental status.     DRUG ALLERGIES:   Allergies  Allergen Reactions  . Augmentin [Amoxicillin-Pot Clavulanate] Anaphylaxis and Other (See Comments)    Has patient had a PCN reaction causing immediate rash, facial/tongue/throat swelling, SOB or lightheadedness with hypotension: Yes Has patient had a PCN reaction causing severe rash involving mucus membranes or skin necrosis: No Has patient had a PCN reaction that required hospitalization No Has patient had a PCN reaction occurring within the last 10 years: Yes If all of the above answers are "NO", then may proceed with Cephalosporin use.  . Beta Adrenergic Blockers Other (See Comments)    Reaction:  Unknown  . Clindamycin Other (See Comments)    Reaction:  Redness to face/swollen eyes   . Excedrin Extra Strength [Aspirin-Acetaminophen-Caffeine] Diarrhea  . Keflex [Cephalexin] Swelling and Other (See Comments)    Reaction:  Lip swelling   . Meloxicam Other (See Comments)    Reaction:  Decreased renal function  . Methotrexate Sodium Other (See Comments)     Reaction:  Decreased renal function  . Nsaids Other (See Comments)    Reaction:  Unknown   . Quinine Rash  . Quinolones Rash    VITALS:  Blood pressure 155/63, pulse 105, temperature 98.7 F (37.1 C), temperature source Oral, resp. rate 18, height 4\' 11"  (1.499 m), weight 83.19 kg (183 lb 6.4 oz), SpO2 95 %. PHYSICAL EXAMINATION:  GENERAL:  82 y.o.-year-old patient lying in the bed with no acute distress.  EYES: Pupils equal, round, reactive to light and accommodation. No scleral icterus. Extraocular muscles intact.  HEENT: Head atraumatic, normocephalic. Oropharynx and nasopharynx clear.  NECK:  Supple, no jugular venous distention. No thyroid enlargement, no tenderness.  LUNGS: Normal breath sounds bilaterally, no wheezing, rales,rhonchi or crepitation. No use of accessory muscles of respiration.  CARDIOVASCULAR: S1, S2 normal. No murmurs, rubs, or gallops.  ABDOMEN: Soft, nontender, nondistended. Bowel sounds present. No organomegaly or mass.  EXTREMITIES: No pedal edema, cyanosis, or clubbing.  NEUROLOGIC: Cranial nerves II through XII are intact. Muscle strength 5/5 in all extremities. Sensation intact. Gait not checked.  PSYCHIATRIC: The patient is alert and oriented x 3.  SKIN: No obvious rash, lesion, or ulcer.   Physical Exam LABORATORY PANEL:   CBC  Recent Labs Lab 11/24/15 0552  WBC 4.2  HGB 8.7*  HCT 26.4*  PLT 291   ------------------------------------------------------------------------------------------------------------------  Chemistries   Recent Labs Lab 11/21/15 1950  11/23/15 0028  NA 139  < > 141  K 4.5  < > 3.3*  CL 107  < > 113*  CO2 22  < > 19*  GLUCOSE 167*  < >  141*  BUN 29*  < > 30*  CREATININE 1.72*  < > 1.49*  CALCIUM 9.0  < > 8.1*  AST 25  --   --   ALT 20  --   --   ALKPHOS 113  --   --   BILITOT 0.3  --   --   < > = values in this interval not  displayed. ------------------------------------------------------------------------------------------------------------------  Cardiac Enzymes  Recent Labs Lab 11/22/15 0336 11/22/15 0821  TROPONINI 0.23* 0.23*   ------------------------------------------------------------------------------------------------------------------  RADIOLOGY:  Dg Chest 1 View  11/22/2015  CLINICAL DATA:  Acute onset of generalized weakness. Code sepsis. Initial encounter. EXAM: CHEST 1 VIEW COMPARISON:  Chest radiograph performed 11/21/2015 FINDINGS: The lungs are well-aerated. Mild bibasilar airspace opacities may reflect mild pneumonia. There is no evidence of pleural effusion or pneumothorax. The cardiomediastinal silhouette is enlarged. No acute osseous abnormalities are seen. IMPRESSION: 1. Mild bibasilar opacities may reflect mild pneumonia. 2. Cardiomegaly. Electronically Signed   By: Roanna Raider M.D.   On: 11/22/2015 18:32    ASSESSMENT AND PLAN:   Principal Problem:   Sepsis (HCC) Active Problems:   Essential (primary) hypertension   CKD (chronic kidney disease) stage 4, GFR 15-29 ml/min (HCC)   UTI (lower urinary tract infection)   Atrial fibrillation with rapid ventricular response (HCC)   Demand myocardial infarction (HCC) - Type II MI in setting of Sepsis   New onset atrial fibrillation (HCC)   Sepsis secondary to UTI (HCC)  * Atrial fibrillation with rapid ventricular response  This is likely secondary to sepsis, this is new onset.  Continue monitoring for now on telemetry   Patient is started on Cardizem by mouth 3 times a day to titrate as needed   Appreciated cardiology Dr. Jari Sportsman recommendations  Discontinued clonidine -Patient is started on eliquis anticoagulation (NOAC) off heparin drip    * Sepsis secondary to UTI? - blood c/s growing MRSA (1/4 bottle)-coag-negative -Urine culture with no growth so far  Aztreonam due to multiple Abx allergies.   IV fluids to  support for sepsis.   * Chronic renal failure  Stable, at baseline. creatinine 1.77-1.49  * Hyperlipidemia  Continue statin.  * Arthritis  Continue home medications.  * Diabetes  Hold oral medication has patient has altered mental status for now, keep on insulin sliding scale coverage.  * Anemia of chronic dz: stable Hb 8.0--8.7, monitor while on  anticoagulation with eliquis  PT is recommending home health PT and RN follow-up with social worker  All the records are reviewed and case discussed with Care Management/Social Workerr. Management plans discussed with the patient, family and they are in agreement.  CODE STATUS: full  TOTAL TIME TAKING CARE OF THIS PATIENT: 35 minutes.    POSSIBLE D/C IN 1-2 DAYS, DEPENDING ON CLINICAL CONDITION.   Ramonita Lab M.D on 11/24/2015   Between 7am to 6pm - Pager - [phone]  After 6pm go to [email or web address] - password [email or web address]  [facility]  Office  [phone]  CC: Primary care physician; Barbette Reichmann, MD  Note: This dictation was prepared with Dragon dictation along with smaller phrase technology. Any transcriptional errors that result from this process are unintentional.

## 2015-11-24 NOTE — Consult Note (Addendum)
[facility] Infectious Disease     Reason for Consult: Bacteremia, sepsis   Referring Physician: Gouru Date of Admission:  11/21/2015   Principal Problem:   Sepsis ([place]) Active Problems:   Essential (primary) hypertension   CKD (chronic kidney disease) stage 4, GFR 15-29 ml/min (HCC)   UTI (lower urinary tract infection)   Atrial fibrillation with rapid ventricular response (HCC)   Demand myocardial infarction ([place]) - Type II MI in setting of Sepsis   New onset atrial fibrillation ([place])   Sepsis secondary to UTI [facility])   HPI: Kelsey Chung is a 82 y.o. female admitted 6/13 with weakness, vomiting, shaking and temp 103.  She has hx DM, htn RA and prior CVA, lives with daughter and is relatively mobile with a walker.  Recently started on keflex for UTI as otpt but had reaction with swollen tongue, so stopped it .  On admission wbc was 3.3, cr 1.77 lfts nml,  UA with TNTC wbc. CXR neg. [place] now + Coag neg staph. UCX neg.  She has RA and was on MTX.  Treated with vanco and aztreonam and wbc dropped to 2 but now up to 4.2. Has defervesced. Having issues with HR and A fib now  Her daughter reports that she had been having some slight burning at the very end of urinating but that is now resolved  Allergic also to amox and clindamycin.    Past Medical History  Diagnosis Date  . Diabetes ([place])   . Hypertension   . Rheumatoid arthritis(714.0)   . Hyperlipidemia   . Gout   . Osteoarthritis (arthritis due to wear and tear of joints)   . Scoliosis   . Constipation, chronic   . CKD (chronic kidney disease) stage 4, GFR 15-29 ml/min (HCC)   . New onset atrial fibrillation ([place])     a. diagnosed in 11/2015 - admitted for sepsis  . TIA (transient ischemic attack)     a. multiple TIA's 20+ years ago according to patient's daughter   Past Surgical History  Procedure Laterality Date  . Hernia repair    . Gallbladder surgery    . Appendectomy    . Ovarian cyst surgery    . Foot surgery  Right   . Eye surgery Bilateral   . Breast cyst excision Left   . Cardiac catheterization    . Colonoscopy    . Esophagogastroduodenoscopy endoscopy    . Dilation and curettage of uterus    . Esophageal manometry N/A 05/24/2015    Procedure: ESOPHAGEAL MANOMETRY (EM);  Surgeon: Josefine Class, MD;  Location: [facility] ENDOSCOPY;  Service: Endoscopy;  Laterality: N/A;   Social History  Substance Use Topics  . Smoking status: Never Smoker   . Smokeless tobacco: Never Used  . Alcohol Use: No   Family History  Problem Relation Age of Onset  . Breast cancer Mother   . CAD Father     Allergies:  Allergies  Allergen Reactions  . Augmentin [Amoxicillin-Pot Clavulanate] Anaphylaxis and Other (See Comments)    Has patient had a PCN reaction causing immediate rash, facial/tongue/throat swelling, SOB or lightheadedness with hypotension: Yes Has patient had a PCN reaction causing severe rash involving mucus membranes or skin necrosis: No Has patient had a PCN reaction that required hospitalization No Has patient had a PCN reaction occurring within the last 10 years: Yes If all of the above answers are "NO", then may proceed with Cephalosporin use.  . Beta Adrenergic Blockers Other (See  Comments)    Reaction:  Unknown  . Clindamycin Other (See Comments)    Reaction:  Redness to face/swollen eyes   . Excedrin Extra Strength [Aspirin-Acetaminophen-Caffeine] Diarrhea  . Keflex [Cephalexin] Swelling and Other (See Comments)    Reaction:  Lip swelling   . Meloxicam Other (See Comments)    Reaction:  Decreased renal function  . Methotrexate Sodium Other (See Comments)    Reaction:  Decreased renal function  . Nsaids Other (See Comments)    Reaction:  Unknown   . Quinine Rash  . Quinolones Rash    Current antibiotics: Antibiotics Given (last 72 hours)    Date/Time Action Medication Dose Rate   11/22/15 0532 Given   aztreonam (AZACTAM) 1 g in dextrose 5 % 50 mL IVPB 1 g 100 mL/hr    11/22/15 0925 Given   hydroxychloroquine (PLAQUENIL) tablet 200 mg 200 mg    11/22/15 1449 Given   aztreonam (AZACTAM) 1 g in dextrose 5 % 50 mL IVPB 1 g 100 mL/hr   11/22/15 2102 Given   aztreonam (AZACTAM) 1 g in dextrose 5 % 50 mL IVPB 1 g 100 mL/hr   11/23/15 0036 Given   vancomycin (VANCOCIN) IVPB 750 mg/150 ml premix 750 mg 150 mL/hr   11/23/15 0438 Given  [early per pt preference]   aztreonam (AZACTAM) 1 g in dextrose 5 % 50 mL IVPB 1 g 100 mL/hr   11/23/15 0852 Given   hydroxychloroquine (PLAQUENIL) tablet 200 mg 200 mg    11/23/15 1338 Given   aztreonam (AZACTAM) 1 g in dextrose 5 % 50 mL IVPB 1 g 100 mL/hr   11/23/15 2149 Given   aztreonam (AZACTAM) 1 g in dextrose 5 % 50 mL IVPB 1 g 100 mL/hr   11/24/15 2992 Given   aztreonam (AZACTAM) 1 g in dextrose 5 % 50 mL IVPB 1 g 100 mL/hr   11/24/15 0852 Given   hydroxychloroquine (PLAQUENIL) tablet 200 mg 200 mg    11/24/15 1325 Given   aztreonam (AZACTAM) 1 g in dextrose 5 % 50 mL IVPB 1 g 100 mL/hr   11/24/15 1502 Given   vancomycin (VANCOCIN) IVPB 750 mg/150 ml premix 750 mg 150 mL/hr      MEDICATIONS: . allopurinol  100 mg Oral BID  . apixaban  2.5 mg Oral BID  . aztreonam  1 g Intravenous Q8H  . cholecalciferol  1,000 Units Oral Daily  . diltiazem  90 mg Oral Q8H  . ferrous sulfate  325 mg Oral Q breakfast  . gabapentin  600 mg Oral TID  . hydroxychloroquine  200 mg Oral Daily  . insulin aspart  0-9 Units Subcutaneous TID WC  . isosorbide dinitrate  10 mg Oral TID  . lactulose  20 g Oral BID  . losartan  50 mg Oral Daily  . omega-3 acid ethyl esters  1 g Oral BID  . pantoprazole  40 mg Oral Daily  . rOPINIRole  0.5 mg Oral QHS  . simvastatin  20 mg Oral QHS  . sodium chloride flush  3 mL Intravenous Q12H  . vancomycin  750 mg Intravenous Q36H  . vitamin B-12  1,000 mcg Oral Daily  . vitamin C  500 mg Oral Daily    Review of Systems - 11 systems reviewed and negative per HPI   OBJECTIVE: Temp:  [97.8 F  (36.6 C)-98.7 F (37.1 C)] 98.7 F (37.1 C) (06/16 1139) Pulse Rate:  [87-105] 105 (06/16 1139) Resp:  [16-18] 18 (06/16  1139) BP: (146-155)/(57-63) 155/63 mmHg (06/16 1139) SpO2:  [95 %-100 %] 95 % (06/16 1139) Physical Exam  Constitutional:  oriented to person, place, and time. appears well-developed and well-nourished. No distress.  HENT: [place]/AT, PERRLA, no scleral icterus Mouth/Throat: Oropharynx is clear and moist. No oropharyngeal exudate.  Cardiovascular: IRR  Pulmonary/Chest: Effort normal and breath sounds normal. No respiratory distress.  has no wheezes.  Neck = supple, no nuchal rigidity Abdominal: Soft. Bowel sounds are normal.  exhibits no distension. There is no tenderness.  Lymphadenopathy: no cervical adenopathy. No axillary adenopathy Neurological: alert and oriented to person, place, and time.  Skin: Skin is warm and dry. 2 dry scabs ant shin, no infection Psychiatric: a normal mood and affect.  behavior is normal.    LABS: Results for orders placed or performed during the hospital encounter of 11/21/15 (from the past 48 hour(s))  Heparin level (unfractionated)     Status: None   Collection Time: 11/22/15  3:51 PM  Result Value Ref Range   Heparin Unfractionated 0.31 0.30 - 0.70 IU/mL    Comment:        IF HEPARIN RESULTS ARE BELOW EXPECTED VALUES, AND PATIENT DOSAGE HAS BEEN CONFIRMED, SUGGEST FOLLOW UP TESTING OF ANTITHROMBIN III LEVELS.   Glucose, capillary     Status: None   Collection Time: 11/22/15  5:11 PM  Result Value Ref Range   Glucose-Capillary 86 65 - 99 mg/dL  Glucose, capillary     Status: Abnormal   Collection Time: 11/22/15  8:51 PM  Result Value Ref Range   Glucose-Capillary 146 (H) 65 - 99 mg/dL  Basic metabolic panel     Status: Abnormal   Collection Time: 11/23/15 12:28 AM  Result Value Ref Range   Sodium 141 135 - 145 mmol/L   Potassium 3.3 (L) 3.5 - 5.1 mmol/L   Chloride 113 (H) 101 - 111 mmol/L   CO2 19 (L) 22 - 32 mmol/L    Glucose, Bld 141 (H) 65 - 99 mg/dL   BUN 30 (H) 6 - 20 mg/dL   Creatinine, Ser 1.49 (H) 0.44 - 1.00 mg/dL   Calcium 8.1 (L) 8.9 - 10.3 mg/dL   GFR calc non Af Amer 30 (L) >60 mL/min   GFR calc Af Amer 35 (L) >60 mL/min    Comment: (NOTE) The eGFR has been calculated using the CKD EPI equation. This calculation has not been validated in all clinical situations. eGFR's persistently <60 mL/min signify possible Chronic Kidney Disease.    Anion gap 9 5 - 15  CBC     Status: Abnormal   Collection Time: 11/23/15 12:28 AM  Result Value Ref Range   WBC 2.0 (L) 3.6 - 11.0 K/uL   RBC 2.38 (L) 3.80 - 5.20 MIL/uL   Hemoglobin 8.0 (L) 12.0 - 16.0 g/dL   HCT 24.6 (L) 35.0 - 47.0 %   MCV 103.1 (H) 80.0 - 100.0 fL   MCH 33.6 26.0 - 34.0 pg   MCHC 32.6 32.0 - 36.0 g/dL   RDW 18.5 (H) 11.5 - 14.5 %   Platelets 235 150 - 440 K/uL  Heparin level (unfractionated)     Status: None   Collection Time: 11/23/15 12:28 AM  Result Value Ref Range   Heparin Unfractionated 0.35 0.30 - 0.70 IU/mL    Comment:        IF HEPARIN RESULTS ARE BELOW EXPECTED VALUES, AND PATIENT DOSAGE HAS BEEN CONFIRMED, SUGGEST FOLLOW UP TESTING OF ANTITHROMBIN III LEVELS.   Glucose,  capillary     Status: Abnormal   Collection Time: 11/23/15  7:22 AM  Result Value Ref Range   Glucose-Capillary 137 (H) 65 - 99 mg/dL  Glucose, capillary     Status: Abnormal   Collection Time: 11/23/15 11:40 AM  Result Value Ref Range   Glucose-Capillary 168 (H) 65 - 99 mg/dL  Glucose, capillary     Status: Abnormal   Collection Time: 11/23/15  4:13 PM  Result Value Ref Range   Glucose-Capillary 225 (H) 65 - 99 mg/dL   Comment 1 Notify RN    Comment 2 Document in Chart   Glucose, capillary     Status: Abnormal   Collection Time: 11/23/15  8:55 PM  Result Value Ref Range   Glucose-Capillary 180 (H) 65 - 99 mg/dL  Heparin level (unfractionated)     Status: Abnormal   Collection Time: 11/24/15  5:52 AM  Result Value Ref Range   Heparin  Unfractionated 0.22 (L) 0.30 - 0.70 IU/mL    Comment:        IF HEPARIN RESULTS ARE BELOW EXPECTED VALUES, AND PATIENT DOSAGE HAS BEEN CONFIRMED, SUGGEST FOLLOW UP TESTING OF ANTITHROMBIN III LEVELS.   CBC     Status: Abnormal   Collection Time: 11/24/15  5:52 AM  Result Value Ref Range   WBC 4.2 3.6 - 11.0 K/uL   RBC 2.54 (L) 3.80 - 5.20 MIL/uL   Hemoglobin 8.7 (L) 12.0 - 16.0 g/dL   HCT 26.4 (L) 35.0 - 47.0 %   MCV 104.1 (H) 80.0 - 100.0 fL   MCH 34.2 (H) 26.0 - 34.0 pg   MCHC 32.8 32.0 - 36.0 g/dL   RDW 19.0 (H) 11.5 - 14.5 %   Platelets 291 150 - 440 K/uL  Glucose, capillary     Status: Abnormal   Collection Time: 11/24/15  7:41 AM  Result Value Ref Range   Glucose-Capillary 143 (H) 65 - 99 mg/dL   Comment 1 Notify RN   Glucose, capillary     Status: Abnormal   Collection Time: 11/24/15 11:38 AM  Result Value Ref Range   Glucose-Capillary 153 (H) 65 - 99 mg/dL   Comment 1 Notify RN    No components found for: ESR, C REACTIVE PROTEIN MICRO: Recent Results (from the past 720 hour(s))  Urine culture     Status: None   Collection Time: 11/21/15  7:45 PM  Result Value Ref Range Status   Specimen Description URINE, CATHETERIZED  Final   Special Requests NONE  Final   Culture NO GROWTH Performed at [facility]   Final   Report Status 11/23/2015 FINAL  Final  Blood Culture (routine x 2)     Status: None (Preliminary result)   Collection Time: 11/21/15  7:50 PM  Result Value Ref Range Status   Specimen Description BLOOD RIGHT FOREARM  Final   Special Requests BOTTLES DRAWN AEROBIC AND ANAEROBIC  0.5CC  Final   Culture  Setup Time   Final    GRAM POSITIVE RODS AEROBIC BOTTLE ONLY CRITICAL RESULT CALLED TO, READ BACK BY AND VERIFIED WITH: LEAH LEE 11/24/15 1000 BY SJL    Culture GRAM POSITIVE RODS  Final   Report Status PENDING  Incomplete  Blood Culture (routine x 2)     Status: Abnormal (Preliminary result)   Collection Time: 11/21/15  7:50 PM  Result Value  Ref Range Status   Specimen Description BLOOD LEFT WRIST  Final   Special Requests BOTTLES DRAWN AEROBIC AND  ANAEROBIC  0.5CC  Final   Culture  Setup Time   Final    GRAM POSITIVE COCCI IN BOTH AEROBIC AND ANAEROBIC BOTTLES CRITICAL RESULT CALLED TO, READ BACK BY AND VERIFIED WITH: MATT MCBANE @ 3500 ON 11/22/2015 BY CAF CONFIRMED BY TLB    Culture STAPHYLOCOCCUS SPECIES (COAGULASE NEGATIVE) (A)  Final   Report Status PENDING  Incomplete  Blood Culture ID Panel (Reflexed)     Status: Abnormal   Collection Time: 11/21/15  7:50 PM  Result Value Ref Range Status   Enterococcus species NOT DETECTED NOT DETECTED Final   Vancomycin resistance NOT DETECTED NOT DETECTED Final   Listeria monocytogenes NOT DETECTED NOT DETECTED Final   Staphylococcus species DETECTED (A) NOT DETECTED Final    Comment: CRITICAL RESULT CALLED TO, READ BACK BY AND VERIFIED WITH: MATT MCBANE @ 9381 ON 11/22/2015 BY CAF    Staphylococcus aureus NOT DETECTED NOT DETECTED Final   Methicillin resistance DETECTED (A) NOT DETECTED Final    Comment: CRITICAL RESULT CALLED TO, READ BACK BY AND VERIFIED WITH: MATT MCBANE @ 8299 ON 11/22/2015 BY CAF    Streptococcus species NOT DETECTED NOT DETECTED Final   Streptococcus agalactiae NOT DETECTED NOT DETECTED Final   Streptococcus pneumoniae NOT DETECTED NOT DETECTED Final   Streptococcus pyogenes NOT DETECTED NOT DETECTED Final   Acinetobacter baumannii NOT DETECTED NOT DETECTED Final   Enterobacteriaceae species NOT DETECTED NOT DETECTED Final   Enterobacter cloacae complex NOT DETECTED NOT DETECTED Final   Escherichia coli NOT DETECTED NOT DETECTED Final   Klebsiella oxytoca NOT DETECTED NOT DETECTED Final   Klebsiella pneumoniae NOT DETECTED NOT DETECTED Final   Proteus species NOT DETECTED NOT DETECTED Final   Serratia marcescens NOT DETECTED NOT DETECTED Final   Carbapenem resistance NOT DETECTED NOT DETECTED Final   Haemophilus influenzae NOT DETECTED NOT DETECTED  Final   Neisseria meningitidis NOT DETECTED NOT DETECTED Final   Pseudomonas aeruginosa NOT DETECTED NOT DETECTED Final   Candida albicans NOT DETECTED NOT DETECTED Final   Candida glabrata NOT DETECTED NOT DETECTED Final   Candida krusei NOT DETECTED NOT DETECTED Final   Candida parapsilosis NOT DETECTED NOT DETECTED Final   Candida tropicalis NOT DETECTED NOT DETECTED Final    IMAGING: Dg Chest 1 View  11/22/2015  CLINICAL DATA:  Acute onset of generalized weakness. Code sepsis. Initial encounter. EXAM: CHEST 1 VIEW COMPARISON:  Chest radiograph performed 11/21/2015 FINDINGS: The lungs are well-aerated. Mild bibasilar airspace opacities may reflect mild pneumonia. There is no evidence of pleural effusion or pneumothorax. The cardiomediastinal silhouette is enlarged. No acute osseous abnormalities are seen. IMPRESSION: 1. Mild bibasilar opacities may reflect mild pneumonia. 2. Cardiomegaly. Electronically Signed   By: Garald Balding M.D.   On: 11/22/2015 18:32   Dg Chest Port 1 View  11/21/2015  CLINICAL DATA:  Family states pt ate some ice cream tonight then went to the bathroom and became weak with uncontrolled shaking. New onset of Afib. History of HTN, stroke, diabetic EXAM: PORTABLE CHEST 1 VIEW COMPARISON:  01/11/2014 FINDINGS: Cardiac silhouette is mildly enlarged. No mediastinal or hilar masses or evidence of adenopathy. Clear lungs. No pleural effusion or pneumothorax. The bony thorax is demineralized but grossly intact. IMPRESSION: No acute cardiopulmonary disease. Electronically Signed   By: Lajean Manes M.D.   On: 11/21/2015 20:05    Assessment:   ASLEE SUCH is a 82 y.o. female admitted with fevers 103, chills and + UA.   She has CNS on bcx  which I think is contaminant.  Her main issue is likely UTI but ucx is negative. She has had UA with multiple samples since 2015 showing > 50 wbc but no positive cultures.  She is allergic to amox and now keflex with lip swelling. Listed  also with quinolines causing rash  Recommendations DC vanco and aztreonam Would change to oral bactrim and monitor for now  Check Post void residual  Will recheck UA today If remains stable would dc on oral bactrim for 14 day course Would strongly suggest otpt urological evaluation.  Thank you very much for allowing me to participate in the care of this patient. Please call with questions.   Cheral Marker. Ola Spurr, MD

## 2015-11-24 NOTE — Care Management (Signed)
Met with patient and daughter to discuss discharge planning. Patient lives at home with her daughter and son in law. She uses a quad cane, has a walker and BSC. She has ben doing her own adls prior to admission. PCP is Dr. Ginette Pitman. Would benefit from SN and PT at discharge. Presented them with [facility] list. No agency preference. Referral to [facility] with [facility]. It is anticipated that patient will discharge tomorrow. New afib. On Eliquis. Coupon given. Patient has part D coverage. Denies issues with transportation.

## 2015-11-24 NOTE — Progress Notes (Signed)
Post residual void resulted at 55mL on bladder scan.

## 2015-11-25 LAB — GLUCOSE, CAPILLARY
GLUCOSE-CAPILLARY: 145 mg/dL — AB (ref 65–99)
GLUCOSE-CAPILLARY: 154 mg/dL — AB (ref 65–99)
GLUCOSE-CAPILLARY: 95 mg/dL (ref 65–99)
Glucose-Capillary: 161 mg/dL — ABNORMAL HIGH (ref 65–99)

## 2015-11-25 MED ORDER — METOPROLOL TARTRATE 25 MG PO TABS
25.0000 mg | ORAL_TABLET | Freq: Four times a day (QID) | ORAL | Status: DC
  Administered 2015-11-25 – 2015-11-26 (×4): 25 mg via ORAL
  Filled 2015-11-25 (×5): qty 1

## 2015-11-25 MED ORDER — DILTIAZEM HCL ER COATED BEADS 180 MG PO CP24
300.0000 mg | ORAL_CAPSULE | Freq: Every day | ORAL | Status: DC
  Administered 2015-11-25 – 2015-11-26 (×2): 300 mg via ORAL
  Filled 2015-11-25 (×2): qty 1

## 2015-11-25 MED ORDER — DIGOXIN 250 MCG PO TABS
0.2500 mg | ORAL_TABLET | Freq: Once | ORAL | Status: AC
  Administered 2015-11-25: 0.25 mg via ORAL
  Filled 2015-11-25: qty 1

## 2015-11-25 NOTE — Progress Notes (Signed)
Patient has rested comfortably today with no complaints of pain. Cardiac medications changed today and heart rate has responded well. Heart rate is down from low 100's to high 60's to 70's at rest, afib. BP stable. Patient and daughter have been educated and updated on new medications. Patient has not gotten up to the bedside commode yet since giving new medications, but informed patient she will be able to stand now. Will continue to monitor.

## 2015-11-25 NOTE — Progress Notes (Signed)
Patient: Kelsey Chung / Admit Date: 11/21/2015 / Date of Encounter: 11/25/2015, 2:28 PM   Subjective: Patient lethargic and this morning, arousable Family at the bedside Well-controlled heart rate at rest, 90-100 bpm, up to 140 bpm on exertion  Review of Systems: Review of Systems  Unable to perform ROS Patient sleeping  Objective: Telemetry:  Physical Exam: Blood pressure 149/65, pulse 96, temperature 98 F (36.7 C), temperature source Oral, resp. rate 18, height 4\' 11"  (1.499 m), weight 183 lb 6.4 oz (83.19 kg), SpO2 95 %. Body mass index is 37.02 kg/(m^2). General: Well developed, well nourished, in no acute distress. Head: Normocephalic, atraumatic, sclera non-icteric, no xanthomas, nares are without discharge. Neck: Negative for carotid bruits. JVP not elevated. Lungs: Clear bilaterally to auscultation without wheezes, rales, or rhonchi. Breathing is unlabored. Heart: Irregular rate and rhythm, without murmurs, rubs, or gallops.  Abdomen: Soft, non-tender, non-distended with normoactive bowel sounds. No rebound/guarding. Extremities: No clubbing or cyanosis. No edema. Distal pedal pulses are 2+ and equal bilaterally. Neuro:  Will move extremities Psych:  Lethargic/sleeping   Intake/Output Summary (Last 24 hours) at 11/25/15 1428 Last data filed at 11/25/15 1300  Gross per 24 hour  Intake    630 ml  Output    450 ml  Net    180 ml    Inpatient Medications:  . allopurinol  100 mg Oral BID  . apixaban  2.5 mg Oral BID  . cholecalciferol  1,000 Units Oral Daily  . digoxin  0.25 mg Oral Once  . diltiazem  300 mg Oral Daily  . ferrous sulfate  325 mg Oral Q breakfast  . gabapentin  600 mg Oral TID  . hydroxychloroquine  200 mg Oral Daily  . insulin aspart  0-9 Units Subcutaneous TID WC  . lactulose  20 g Oral BID  . metoprolol tartrate  25 mg Oral Q6H  . omega-3 acid ethyl esters  1 g Oral BID  . pantoprazole  40 mg Oral Daily  . rOPINIRole  0.5 mg Oral QHS    . simvastatin  20 mg Oral QHS  . sodium chloride flush  3 mL Intravenous Q12H  . sulfamethoxazole-trimethoprim  1 tablet Oral Daily  . vitamin B-12  1,000 mcg Oral Daily  . vitamin C  500 mg Oral Daily   Infusions:    Labs:  Recent Labs  11/23/15 0028  NA 141  K 3.3*  CL 113*  CO2 19*  GLUCOSE 141*  BUN 30*  CREATININE 1.49*  CALCIUM 8.1*   No results for input(s): AST, ALT, ALKPHOS, BILITOT, PROT, ALBUMIN in the last 72 hours.  Recent Labs  11/23/15 0028 11/24/15 0552  WBC 2.0* 4.2  HGB 8.0* 8.7*  HCT 24.6* 26.4*  MCV 103.1* 104.1*  PLT 235 291   No results for input(s): CKTOTAL, CKMB, TROPONINI in the last 72 hours. Invalid input(s): POCBNP No results for input(s): HGBA1C in the last 72 hours.   Weights: Filed Weights   11/21/15 2004 11/21/15 2307  Weight: 176 lb (79.833 kg) 183 lb 6.4 oz (83.19 kg)     Radiology/Studies:  Dg Chest 1 View  11/22/2015  CLINICAL DATA:  Acute onset of generalized weakness. Code sepsis. Initial encounter. EXAM: CHEST 1 VIEW COMPARISON:  Chest radiograph performed 11/21/2015 FINDINGS: The lungs are well-aerated. Mild bibasilar airspace opacities may reflect mild pneumonia. There is no evidence of pleural effusion or pneumothorax. The cardiomediastinal silhouette is enlarged. No acute osseous abnormalities are seen. IMPRESSION: 1.  Mild bibasilar opacities may reflect mild pneumonia. 2. Cardiomegaly. Electronically Signed   By: Roanna Raider M.D.   On: 11/22/2015 18:32   Dg Chest Port 1 View  11/21/2015  CLINICAL DATA:  Family states pt ate some ice cream tonight then went to the bathroom and became weak with uncontrolled shaking. New onset of Afib. History of HTN, stroke, diabetic EXAM: PORTABLE CHEST 1 VIEW COMPARISON:  01/11/2014 FINDINGS: Cardiac silhouette is mildly enlarged. No mediastinal or hilar masses or evidence of adenopathy. Clear lungs. No pleural effusion or pneumothorax. The bony thorax is demineralized but grossly  intact. IMPRESSION: No acute cardiopulmonary disease. Electronically Signed   By: Amie Portland M.D.   On: 11/21/2015 20:05     Assessment and Plan  82 y.o. female   1.New onset Afib with RVR: -Likely in the setting of the patient's sepsis 2/2 UTI.  We will discontinue isosorbide and losartan Increase Cardizem CD up to 300 mg daily Add metoprolol 25 mg every 6 hours for rate control 1 dose of digoxin 0.25 today  -CHADS2VASc at least 7 (HTN, age x 2, DM, TIA x 2, female) giving her an estimated annual stroke risk of 11.2%. . I stopped heparin and started Eliquis 2.5 mg twice daily. This dose was chosen given her age and the fact that she has chronic kidney disease with creatinine mostly above 1.5.  2. Volume overload/SOB/PNA: High risk of diastolic CHF in the setting of rapid atrial fibrillation, will monitor carefully  3. Sepsis 2/2 UTI and possible PNA: Slow improvement, likely contributing to her elevated heart rate  4. Accelerated HTN: Various medication changes as above, once heart rate well controlled, could restart losartan or isosorbide for blood pressure control  6. Moderate aortic stenosis:  noted on echocardiogram. Ejection fraction was 50-55%.  Will monitor as an outpatient  Signed, Dossie Arbour, MD, Ph.D. [facility] 11/25/2015, 2:28 PM

## 2015-11-25 NOTE — Progress Notes (Signed)
Patient got up to bedside commode and heart rate stayed in the 70's. No distress.

## 2015-11-25 NOTE — Progress Notes (Addendum)
[facility] [place] [facility]   PATIENT NAME: Kelsey Chung    MR#:  [id number]  DATE OF BIRTH:  1923/09/02  SUBJECTIVE:  CHIEF COMPLAINT:   Chief Complaint  Patient presents with  . Weakness  . Code Sepsis    Patient denies any dizziness or loss of consciousness. Getting tachycardic when she ambulates 2-3 steps  Kelsey Chung  daughter at bedside REVIEW OF SYSTEMS:     ROS   General: No chills, fever, night sweats or weight changes. Reporting generalized weakness HEENT-denies any blurry vision or tinnitus Cardiovascular: No chest pain, dyspnea on exertion, edema, orthopnea, palpitations, paroxysmal nocturnal dyspnea.  Dermatological: No rash, lesions/masses  Respiratory: No cough, dyspnea  Urologic: No hematuria, dysuria  Abdominal: No nausea, vomiting, diarrhea, bright red blood per rectum, melena, or hematemesis. .  Neurologic: No visual changes, changes in mental status.     DRUG ALLERGIES:   Allergies  Allergen Reactions  . Augmentin [Amoxicillin-Pot Clavulanate] Anaphylaxis and Other (See Comments)    Has patient had a PCN reaction causing immediate rash, facial/tongue/throat swelling, SOB or lightheadedness with hypotension: Yes Has patient had a PCN reaction causing severe rash involving mucus membranes or skin necrosis: No Has patient had a PCN reaction that required hospitalization No Has patient had a PCN reaction occurring within the last 10 years: Yes If all of the above answers are "NO", then may proceed with Cephalosporin use.  . Beta Adrenergic Blockers Other (See Comments)    Reaction:  Unknown  . Clindamycin Other (See Comments)    Reaction:  Redness to face/swollen eyes   . Excedrin Extra Strength [Aspirin-Acetaminophen-Caffeine] Diarrhea  . Keflex [Cephalexin] Swelling and Other (See Comments)    Reaction:  Lip swelling   . Meloxicam Other (See Comments)    Reaction:  Decreased renal function  . Methotrexate Sodium Other (See  Comments)    Reaction:  Decreased renal function  . Nsaids Other (See Comments)    Reaction:  Unknown   . Quinine Rash  . Quinolones Rash    VITALS:  Blood pressure 143/53, pulse 67, temperature 98 F (36.7 C), temperature source Oral, resp. rate 16, height 4\' 11"  (1.499 m), weight 83.19 kg (183 lb 6.4 oz), SpO2 95 %. PHYSICAL EXAMINATION:  GENERAL:  82 y.o.-year-old patient lying in the bed with no acute distress.  EYES: Pupils equal, round, reactive to light and accommodation. No scleral icterus. Extraocular muscles intact.  HEENT: Head atraumatic, normocephalic. Oropharynx and nasopharynx clear.  NECK:  Supple, no jugular venous distention. No thyroid enlargement, no tenderness.  LUNGS: Normal breath sounds bilaterally, no wheezing, rales,rhonchi or crepitation. No use of accessory muscles of respiration.  CARDIOVASCULAR: Irregularly irregular. positive murmur, no rubs, or gallops.  ABDOMEN: Soft, nontender, nondistended. Bowel sounds present. No organomegaly or mass.  EXTREMITIES: No pedal edema, cyanosis, or clubbing.  NEUROLOGIC: Cranial nerves II through XII are intact. Muscle strength 5/5 in all extremities. Sensation intact. Gait not checked.  PSYCHIATRIC: The patient is alert and oriented x 3.  SKIN: No obvious rash, lesion, or ulcer.   Physical Exam LABORATORY PANEL:   CBC  Recent Labs Lab 11/24/15 0552  WBC 4.2  HGB 8.7*  HCT 26.4*  PLT 291   ------------------------------------------------------------------------------------------------------------------  Chemistries   Recent Labs Lab 11/21/15 1950  11/23/15 0028  NA 139  < > 141  K 4.5  < > 3.3*  CL 107  < > 113*  CO2 22  < > 19*  GLUCOSE 167*  < >  141*  BUN 29*  < > 30*  CREATININE 1.72*  < > 1.49*  CALCIUM 9.0  < > 8.1*  AST 25  --   --   ALT 20  --   --   ALKPHOS 113  --   --   BILITOT 0.3  --   --   < > = values in this interval not  displayed. ------------------------------------------------------------------------------------------------------------------  Cardiac Enzymes  Recent Labs Lab 11/22/15 0336 11/22/15 0821  TROPONINI 0.23* 0.23*   ------------------------------------------------------------------------------------------------------------------  RADIOLOGY:  No results found.  ASSESSMENT AND PLAN:   Principal Problem:   Sepsis (HCC) Active Problems:   Essential (primary) hypertension   CKD (chronic kidney disease) stage 4, GFR 15-29 ml/min (HCC)   UTI (lower urinary tract infection)   Atrial fibrillation with rapid ventricular response (HCC)   Demand myocardial infarction (HCC) - Type II MI in setting of Sepsis   New onset atrial fibrillation (HCC)   Sepsis secondary to UTI (HCC)  * Atrial fibrillation with rapid ventricular response-new onset 2/2 sepsis  Continue monitoring for  on telemetry  Start  Cardizem CD 300 mg po daily  Metoprolol 25 po q 6 hrs started Dig 0.25 1 dose today   Appreciated cardiology Dr. Windell Hummingbird recommendations  Discontinued clonidine -Patient is started on eliquis2.5 mg bid  anticoagulation (NOAC) off heparin drip  CHADS2VASc at least 7 (HTN, age x 2, DM, TIA x 2, female)   * Sepsis secondary to UTI? - blood c/s growing MRSA (1/4 bottle)-coag-negative -Urine culture with no growth so far  Aztreonam given due to multiple Abx allergies.Changed to po Bactrim for 14 days per ID recommendations   IV fluids to support for sepsis.  * HTN - elevated BP Could be rebound HTN as clonidine d/cd Cardizem CD 300 and metoprolol started today Monitor BP closely could restart losartan or isosorbid for BP control   * Chronic renal failure  Stable, at baseline. creatinine 1.77-1.49  * Hyperlipidemia  Continue statin.  * Arthritis  Continue home medications.  * Diabetes  Hold oral medication has patient has altered mental status for now, keep on insulin sliding  scale coverage.  * Anemia of chronic dz: stable Hb 8.0--8.7, monitor while on  anticoagulation with eliquis  PT is recommending home health PT and RN ,24 hr supervision  All the records are reviewed and case discussed with Care Management/Social Workerr. Management plans discussed with the patient, family and they are in agreement.  CODE STATUS: full  TOTAL TIME TAKING CARE OF THIS PATIENT: 35 minutes.    POSSIBLE D/C IN 1-2 DAYS, DEPENDING ON CLINICAL CONDITION.   Ramonita Lab M.D on 11/25/2015   Between 7am to 6pm - Pager - [phone]  After 6pm go to [email or web address] - password [email or web address]  [facility]  Office  [phone]  CC: Primary care physician; Barbette Reichmann, MD  Note: This dictation was prepared with Dragon dictation along with smaller phrase technology. Any transcriptional errors that result from this process are unintentional.

## 2015-11-26 LAB — BASIC METABOLIC PANEL
ANION GAP: 8 (ref 5–15)
BUN: 40 mg/dL — AB (ref 6–20)
CHLORIDE: 110 mmol/L (ref 101–111)
CO2: 22 mmol/L (ref 22–32)
Calcium: 9 mg/dL (ref 8.9–10.3)
Creatinine, Ser: 1.84 mg/dL — ABNORMAL HIGH (ref 0.44–1.00)
GFR, EST AFRICAN AMERICAN: 27 mL/min — AB (ref >60)
GFR, EST NON AFRICAN AMERICAN: 23 mL/min — AB (ref >60)
Glucose, Bld: 124 mg/dL — ABNORMAL HIGH (ref 65–99)
POTASSIUM: 4.3 mmol/L (ref 3.5–5.1)
SODIUM: 140 mmol/L (ref 135–145)

## 2015-11-26 LAB — GLUCOSE, CAPILLARY: GLUCOSE-CAPILLARY: 111 mg/dL — AB (ref 65–99)

## 2015-11-26 LAB — CULTURE, BLOOD (ROUTINE X 2)

## 2015-11-26 LAB — URINE CULTURE: Culture: <10000 — AB

## 2015-11-26 MED ORDER — SULFAMETHOXAZOLE-TRIMETHOPRIM 800-160 MG PO TABS: 1.0000 | ORAL_TABLET | Freq: Every day | ORAL | Status: DC

## 2015-11-26 MED ORDER — METOPROLOL TARTRATE 25 MG PO TABS: 12.5000 mg | ORAL_TABLET | Freq: Two times a day (BID) | ORAL | Status: DC

## 2015-11-26 MED ORDER — APIXABAN 2.5 MG PO TABS
2.5000 mg | ORAL_TABLET | Freq: Two times a day (BID) | ORAL | Status: DC
Start: 2015-11-26 — End: 2015-11-27

## 2015-11-26 MED ORDER — DILTIAZEM HCL ER COATED BEADS 240 MG PO CP24: 240.0000 mg | ORAL_CAPSULE | Freq: Every day | ORAL | Status: DC

## 2015-11-26 NOTE — Progress Notes (Signed)
Patient: Kelsey Chung / Admit Date: 11/21/2015 / Date of Encounter: 11/26/2015, 2:47 PM   Subjective: Patient alert, ambulated this morning Family at the bedside Heart rate in the 60s at rest, no significant change on exertion to the bathroom  Review of Systems: Review of Systems  Constitutional: Negative.   Respiratory: Negative.   Cardiovascular: Negative.   Gastrointestinal: Negative.   Musculoskeletal: Negative.   Neurological: Negative.   Psychiatric/Behavioral: Negative.   All other systems reviewed and are negative.   Objective: Telemetry: Atrial fibrillation Physical Exam: Blood pressure 106/74, pulse 60s, temperature 98.7 F (37.1 C), temperature source Oral, resp. rate 20, height 4\' 11"  (1.499 m), weight 183 lb 6.4 oz (83.19 kg), SpO2 97 %. Body mass index is 37.02 kg/(m^2). General: Well developed, well nourished, in no acute distress. Head: Normocephalic, atraumatic, sclera non-icteric, no xanthomas, nares are without discharge. Neck: Negative for carotid bruits. JVP not elevated. Lungs: Clear bilaterally to auscultation without wheezes, rales, or rhonchi. Breathing is unlabored. Heart: Irregular rate and rhythm, without murmurs, rubs, or gallops.  Abdomen: Soft, non-tender, non-distended with normoactive bowel sounds. No rebound/guarding. Extremities: No clubbing or cyanosis. No edema. Distal pedal pulses are 2+ and equal bilaterally. Neuro:  Will move extremities Psych:  Lethargic/sleeping   Intake/Output Summary (Last 24 hours) at 11/26/15 1447 Last data filed at 11/26/15 1100  Gross per 24 hour  Intake   1440 ml  Output    600 ml  Net    840 ml    Inpatient Medications:  . allopurinol  100 mg Oral BID  . apixaban  2.5 mg Oral BID  . cholecalciferol  1,000 Units Oral Daily  . diltiazem  300 mg Oral Daily  . ferrous sulfate  325 mg Oral Q breakfast  . gabapentin  600 mg Oral TID  . hydroxychloroquine  200 mg Oral Daily  . insulin aspart  0-9  Units Subcutaneous TID WC  . lactulose  20 g Oral BID  . metoprolol tartrate  25 mg Oral Q6H  . omega-3 acid ethyl esters  1 g Oral BID  . pantoprazole  40 mg Oral Daily  . rOPINIRole  0.5 mg Oral QHS  . simvastatin  20 mg Oral QHS  . sodium chloride flush  3 mL Intravenous Q12H  . sulfamethoxazole-trimethoprim  1 tablet Oral Daily  . vitamin B-12  1,000 mcg Oral Daily  . vitamin C  500 mg Oral Daily   Infusions:    Labs:  Recent Labs  11/26/15 0545  NA 140  K 4.3  CL 110  CO2 22  GLUCOSE 124*  BUN 40*  CREATININE 1.84*  CALCIUM 9.0   No results for input(s): AST, ALT, ALKPHOS, BILITOT, PROT, ALBUMIN in the last 72 hours.  Recent Labs  11/24/15 0552  WBC 4.2  HGB 8.7*  HCT 26.4*  MCV 104.1*  PLT 291   No results for input(s): CKTOTAL, CKMB, TROPONINI in the last 72 hours. Invalid input(s): POCBNP No results for input(s): HGBA1C in the last 72 hours.   Weights: Filed Weights   11/21/15 2004 11/21/15 2307  Weight: 176 lb (79.833 kg) 183 lb 6.4 oz (83.19 kg)     Radiology/Studies:  Dg Chest 1 View  11/22/2015  CLINICAL DATA:  Acute onset of generalized weakness. Code sepsis. Initial encounter. EXAM: CHEST 1 VIEW COMPARISON:  Chest radiograph performed 11/21/2015 FINDINGS: The lungs are well-aerated. Mild bibasilar airspace opacities may reflect mild pneumonia. There is no evidence of pleural effusion  or pneumothorax. The cardiomediastinal silhouette is enlarged. No acute osseous abnormalities are seen. IMPRESSION: 1. Mild bibasilar opacities may reflect mild pneumonia. 2. Cardiomegaly. Electronically Signed   By: Roanna Raider M.D.   On: 11/22/2015 18:32   Dg Chest Port 1 View  11/21/2015  CLINICAL DATA:  Family states pt ate some ice cream tonight then went to the bathroom and became weak with uncontrolled shaking. New onset of Afib. History of HTN, stroke, diabetic EXAM: PORTABLE CHEST 1 VIEW COMPARISON:  01/11/2014 FINDINGS: Cardiac silhouette is mildly  enlarged. No mediastinal or hilar masses or evidence of adenopathy. Clear lungs. No pleural effusion or pneumothorax. The bony thorax is demineralized but grossly intact. IMPRESSION: No acute cardiopulmonary disease. Electronically Signed   By: Amie Portland M.D.   On: 11/21/2015 20:05     Assessment and Plan  82 y.o. female   1.New onset Afib with RVR: -Likely in the setting of the patient's sepsis 2/2 UTI.   isosorbide and losartan held to make room for rate control medication Started on Cardizem CD  300 mg daily yesterday with metoprolol 25 mg every 6 hours for rate control 1 dose of digoxin 0.25 yesterday --Rate much better, we'll decrease Cardizem down to 240 mg daily, metoprolol 12.5 mg twice a day Recommended family monitor heart rate and blood pressure and call our office with numbers Also on eliquis 2.5 mg twice a day  -CHADS2VASc at least 7 (HTN, age x 2, DM, TIA x 2, female) giving her an estimated annual stroke risk of 11.2%. . I stopped heparin and started Eliquis 2.5 mg twice daily. This dose was chosen given her age and the fact that she has chronic kidney disease with creatinine mostly above 1.5.  2. Volume overload/SOB/PNA: High risk of diastolic CHF  Appears to be relatively euvolemic as morning  3. Sepsis 2/2 UTI and possible PNA: Improvement today, likely contributing to improved heart rate  4. Accelerated HTN: Blood pressure improved on diltiazem and low-dose metoprolol We'll hold losartan, hydralazine Continue HCTZ, Cardizem, low-dose metoprolol Further medication changes depending on blood pressure as an outpatient  6. Moderate aortic stenosis:  noted on echocardiogram. Ejection fraction was 50-55%.  Will monitor as an outpatient  Signed, Dossie Arbour, MD, Ph.D. [facility] 11/26/2015, 2:47 PM

## 2015-11-26 NOTE — Progress Notes (Signed)
Pt discharged, IV and telemetry removed, reviewed dc instructions and home meds, RX given, pt and family verbalized understanding, escorted by volunteer.

## 2015-11-26 NOTE — Discharge Summary (Signed)
[facility] - [place] at [facility]   PATIENT NAME: Kelsey Chung    MR#:  [id number]  DATE OF BIRTH:  10-Dec-1923  DATE OF ADMISSION:  11/21/2015 ADMITTING PHYSICIAN: Altamese Dilling, MD  DATE OF DISCHARGE: 11/26/15 PRIMARY CARE PHYSICIAN: Barbette Reichmann, MD    ADMISSION DIAGNOSIS:  Atrial fibrillation with rapid ventricular response (HCC) [I48.91] Sepsis secondary to UTI (HCC) [A41.9, N39.0] Sepsis, due to unspecified organism (HCC) [A41.9]  DISCHARGE DIAGNOSIS:  Principal Problem:   Sepsis (HCC) Active Problems:   Essential (primary) hypertension   CKD (chronic kidney disease) stage 4, GFR 15-29 ml/min (HCC)   UTI (lower urinary tract infection)   Atrial fibrillation with rapid ventricular response (HCC)   Demand myocardial infarction (HCC) - Type II MI in setting of Sepsis   New onset atrial fibrillation (HCC)   Sepsis secondary to UTI (HCC)   SECONDARY DIAGNOSIS:   Past Medical History  Diagnosis Date  . Diabetes (HCC)   . Hypertension   . Rheumatoid arthritis(714.0)   . Hyperlipidemia   . Gout   . Osteoarthritis (arthritis due to wear and tear of joints)   . Scoliosis   . Constipation, chronic   . CKD (chronic kidney disease) stage 4, GFR 15-29 ml/min (HCC)   . New onset atrial fibrillation (HCC)     a. diagnosed in 11/2015 - admitted for sepsis  . TIA (transient ischemic attack)     a. multiple TIA's 20+ years ago according to patient's daughter    HOSPITAL COURSE:   * Atrial fibrillation with rapid ventricular response-new onset 2/2 sepsis  Patient's heart rate is much better now, with minimal ambulation heart rate is at around 70s to 80s   Cardizem CD 240 mg po daily  Metoprolol 12.5  po 12  hrs started Dig 0.25 1 dose given  Appreciated cardiology Dr. Windell Hummingbird recommendations Discontinued clonidine -Patient is started on eliquis2.5 mg bid anticoagulation (NOAC) off heparin drip  CHADS2VASc at least 7 (HTN,  age x 2, DM, TIA x 2, female)   * Sepsis secondary to UTI? - blood c/s growing MRSA (1/4 bottle)-coag-negative -Urine culture with no growth so far  Aztreonam given due to multiple Abx allergies.Changed to po Bactrim for 14 days per ID recommendations  IV fluids to support for sepsis.  * chronic oesopahageal spasms Tolerating po full liquids , pured diet as tolerated Outpatient follow-up with [facility] GI  as scheduled in a week    * HTN - stable.  Clonidine discontinued  Cardizem CD 300 and metoprolol started  Monitor BP closely could restart losartan or isosorbid for BP control  Discontinued hydralazine Continue hydrochlorothiazide and metoprolol  * Chronic renal failure  Stable, at baseline. creatinine 1.77-1.49--1.84. Continue close monitoring and outpatient follow-up with nephrology if needed   * Hyperlipidemia  Continue statin.  * Arthritis  Continue home medications.  * Diabetes  Hold oral medication has patient has altered mental status for now, keep on insulin sliding scale coverage.  * Anemia of chronic dz: stable Hb 8.0--8.7, monitor while on anticoagulation with eliquis  PT is recommending home health PT and RN ,24 hr supervision  All the records are reviewed and case discussed with Care Management/Social Workerr. Management plans discussed with the patient, family and they are in agreement.  CODE STATUS: full  DISCHARGE CONDITIONS:   Fair   CONSULTS OBTAINED:  Treatment Team:  Antonieta Iba, MD Mick Sell, MD   PROCEDURES None   D RUG ALLERGIES:  Allergies  Allergen Reactions  . Augmentin [Amoxicillin-Pot Clavulanate] Anaphylaxis and Other (See Comments)    Has patient had a PCN reaction causing immediate rash, facial/tongue/throat swelling, SOB or lightheadedness with hypotension: Yes Has patient had a PCN reaction causing severe rash involving mucus membranes or skin necrosis: No Has patient had a PCN reaction that required  hospitalization No Has patient had a PCN reaction occurring within the last 10 years: Yes If all of the above answers are "NO", then may proceed with Cephalosporin use.  . Beta Adrenergic Blockers Other (See Comments)    Reaction:  Unknown  . Clindamycin Other (See Comments)    Reaction:  Redness to face/swollen eyes   . Excedrin Extra Strength [Aspirin-Acetaminophen-Caffeine] Diarrhea  . Keflex [Cephalexin] Swelling and Other (See Comments)    Reaction:  Lip swelling   . Meloxicam Other (See Comments)    Reaction:  Decreased renal function  . Methotrexate Sodium Other (See Comments)    Reaction:  Decreased renal function  . Nsaids Other (See Comments)    Reaction:  Unknown   . Quinine Rash  . Quinolones Rash    DISCHARGE MEDICATIONS:   Current Discharge Medication List    START taking these medications   Details  apixaban (ELIQUIS) 2.5 MG TABS tablet Take 1 tablet (2.5 mg total) by mouth 2 (two) times daily. Qty: 60 tablet, Refills: 0    diltiazem (CARDIZEM CD) 240 MG 24 hr capsule Take 1 capsule (240 mg total) by mouth daily. Qty: 30 capsule, Refills: 0    metoprolol tartrate (LOPRESSOR) 25 MG tablet Take 0.5 tablets (12.5 mg total) by mouth 2 (two) times daily. Qty: 60 tablet, Refills: 0    sulfamethoxazole-trimethoprim (BACTRIM DS,SEPTRA DS) 800-160 MG tablet Take 1 tablet by mouth daily. Qty: 14 tablet, Refills: 0      CONTINUE these medications which have NOT CHANGED   Details  allopurinol (ZYLOPRIM) 100 MG tablet Take 100 mg by mouth 2 (two) times daily.     aspirin EC 81 MG tablet Take 81 mg by mouth daily.    cholecalciferol (VITAMIN D) 1000 units tablet Take 1,000 Units by mouth daily.    ferrous sulfate 325 (65 FE) MG tablet Take 325 mg by mouth daily with breakfast.    gabapentin (NEURONTIN) 600 MG tablet Take 600 mg by mouth 3 (three) times daily.    glipiZIDE (GLUCOTROL) 5 MG tablet Take 5 mg by mouth 2 (two) times daily before a meal.      glucosamine-chondroitin 500-400 MG tablet Take 1 tablet by mouth daily.    hydrochlorothiazide (HYDRODIURIL) 25 MG tablet Take 25 mg by mouth daily.     hydroxychloroquine (PLAQUENIL) 200 MG tablet Take 200 mg by mouth daily.    lactulose (CHRONULAC) 10 GM/15ML solution Take 20 g by mouth 2 (two) times daily.     mupirocin cream (BACTROBAN) 2 % Apply 1 application topically at bedtime.    omega-3 acid ethyl esters (LOVAZA) 1 g capsule Take 1 g by mouth 2 (two) times daily.    pantoprazole (PROTONIX) 40 MG tablet Take 40 mg by mouth daily.     rOPINIRole (REQUIP) 0.5 MG tablet Take 0.5 mg by mouth at bedtime.    simvastatin (ZOCOR) 20 MG tablet Take 20 mg by mouth at bedtime.     traMADol (ULTRAM) 50 MG tablet Take 50 mg by mouth every 8 (eight) hours as needed for moderate pain.     Turmeric 500 MG TABS Take 500 mg by  mouth daily.    vitamin B-12 (CYANOCOBALAMIN) 1000 MCG tablet Take 1,000 mcg by mouth daily.    vitamin C (ASCORBIC ACID) 500 MG tablet Take 500 mg by mouth daily.      STOP taking these medications     cloNIDine (CATAPRES) 0.2 MG tablet      hydrALAZINE (APRESOLINE) 25 MG tablet      isosorbide dinitrate (ISORDIL) 10 MG tablet      losartan (COZAAR) 100 MG tablet          DISCHARGE INSTRUCTIONS:   Activity as tolerated per PT recommendations .patient needs 24-hour supervision and assistance  diet pure/full liquid diet as tolerated   follow-up with primary care physician in a week   follow-up with cardiology Dr. Mariah Milling in a week Follow-up with urology outpatient in a week Follow-up with [facility] gastroenterology as scheduled or in a week   DIET:  diet pure/full liquid diet as tolerated   DISCHARGE CONDITION:  Fair  ACTIVITY:  Activity as tolerated  OXYGEN:  Home Oxygen: No.   Oxygen Delivery: room air  DISCHARGE LOCATION:  home   If you experience worsening of your admission symptoms, develop shortness of breath, life threatening  emergency, suicidal or homicidal thoughts you must seek medical attention immediately by calling 911 or calling your MD immediately  if symptoms less severe.  You Must read complete instructions/literature along with all the possible adverse reactions/side effects for all the Medicines you take and that have been prescribed to you. Take any new Medicines after you have completely understood and accpet all the possible adverse reactions/side effects.   Please note  You were cared for by a hospitalist during your hospital stay. If you have any questions about your discharge medications or the care you received while you were in the hospital after you are discharged, you can call the unit and asked to speak with the hospitalist on call if the hospitalist that took care of you is not available. Once you are discharged, your primary care physician will handle any further medical issues. Please note that NO REFILLS for any discharge medications will be authorized once you are discharged, as it is imperative that you return to your primary care physician (or establish a relationship with a primary care physician if you do not have one) for your aftercare needs so that they can reassess your need for medications and monitor your lab values.     Today  Chief Complaint  Patient presents with  . Weakness  . Code Sepsis   Patient is resting comfortably. Patient is out of bed to chair and was able to ambulate a few steps without any tachycardia Feels comfortable to go home. Has an appointment with [facility] gastroenterology for chronic esophageal spasms Tolerating full liquid diet  ROS:  CONSTITUTIONAL: Denies fevers, chills. Denies any fatigue, weakness.  EYES: Denies blurry vision, double vision, eye pain. EARS, NOSE, THROAT: Denies tinnitus, ear pain, hearing loss. RESPIRATORY: Denies cough, wheeze, shortness of breath.  CARDIOVASCULAR: Denies chest pain, palpitations, edema.  GASTROINTESTINAL: Denies  nausea, vomiting, diarrhea, abdominal pain. Denies bright red blood per rectum. GENITOURINARY: Denies dysuria, hematuria. ENDOCRINE: Denies nocturia or thyroid problems. HEMATOLOGIC AND LYMPHATIC: Denies easy bruising or bleeding. SKIN: Denies rash or lesion. MUSCULOSKELETAL: Denies pain in neck, back, shoulder, knees, hips or arthritic symptoms.  NEUROLOGIC: Denies paralysis, paresthesias.  PSYCHIATRIC: Denies anxiety or depressive symptoms.   VITAL SIGNS:  Blood pressure 106/74, pulse 151, temperature 98.7 F (37.1 C), temperature source Oral,  resp. rate 20, height 4\' 11"  (1.499 m), weight 83.19 kg (183 lb 6.4 oz), SpO2 97 %.  I/O:    Intake/Output Summary (Last 24 hours) at 11/26/15 1123 Last data filed at 11/26/15 0900  Gross per 24 hour  Intake   1440 ml  Output    500 ml  Net    940 ml    PHYSICAL EXAMINATION:  GENERAL:  82 y.o.-year-old patient lying in the bed with no acute distress.  EYES: Pupils equal, round, reactive to light and accommodation. No scleral icterus. Extraocular muscles intact.  HEENT: Head atraumatic, normocephalic. Oropharynx and nasopharynx clear.  NECK:  Supple, no jugular venous distention. No thyroid enlargement, no tenderness.  LUNGS: Normal breath sounds bilaterally, no wheezing, rales,rhonchi or crepitation. No use of accessory muscles of respiration.  CARDIOVASCULAR: Irregularly irregular, no rubs, or gallops.  ABDOMEN: Soft, non-tender, non-distended. Bowel sounds present. No organomegaly or mass.  EXTREMITIES: No pedal edema, cyanosis, or clubbing.  NEUROLOGIC: Cranial nerves II through XII are intact. Muscle strength at her baseline. Sensation intact. Gait not checked.  PSYCHIATRIC: The patient is alert and oriented x 3.  SKIN: No obvious rash, lesion, or ulcer.   DATA REVIEW:   CBC  Recent Labs Lab 11/24/15 0552  WBC 4.2  HGB 8.7*  HCT 26.4*  PLT 291    Chemistries   Recent Labs Lab 11/21/15 1950  11/26/15 0545  NA 139  <  > 140  K 4.5  < > 4.3  CL 107  < > 110  CO2 22  < > 22  GLUCOSE 167*  < > 124*  BUN 29*  < > 40*  CREATININE 1.72*  < > 1.84*  CALCIUM 9.0  < > 9.0  AST 25  --   --   ALT 20  --   --   ALKPHOS 113  --   --   BILITOT 0.3  --   --   < > = values in this interval not displayed.  Cardiac Enzymes  Recent Labs Lab 11/22/15 0821  TROPONINI 0.23*    Microbiology Results  Results for orders placed or performed during the hospital encounter of 11/21/15  Urine culture     Status: None   Collection Time: 11/21/15  7:45 PM  Result Value Ref Range Status   Specimen Description URINE, CATHETERIZED  Final   Special Requests NONE  Final   Culture NO GROWTH Performed at [facility]   Final   Report Status 11/23/2015 FINAL  Final  Blood Culture (routine x 2)     Status: Abnormal   Collection Time: 11/21/15  7:50 PM  Result Value Ref Range Status   Specimen Description BLOOD RIGHT FOREARM  Final   Special Requests BOTTLES DRAWN AEROBIC AND ANAEROBIC  0.5CC  Final   Culture  Setup Time   Final    GRAM POSITIVE RODS AEROBIC BOTTLE ONLY CRITICAL RESULT CALLED TO, READ BACK BY AND VERIFIED WITH: LEAH LEE 11/24/15 1000 BY SJL    Culture (A)  Final    DIPHTHEROIDS(CORYNEBACTERIUM SPECIES) Standardized susceptibility testing for this organism is not available. Performed at [facility]    Report Status 11/26/2015 FINAL  Final  Blood Culture (routine x 2)     Status: Abnormal (Preliminary result)   Collection Time: 11/21/15  7:50 PM  Result Value Ref Range Status   Specimen Description BLOOD LEFT WRIST  Final   Special Requests BOTTLES DRAWN AEROBIC AND ANAEROBIC  0.5CC  Final  Culture  Setup Time   Final    GRAM POSITIVE COCCI IN BOTH AEROBIC AND ANAEROBIC BOTTLES CRITICAL RESULT CALLED TO, READ BACK BY AND VERIFIED WITH: MATT MCBANE @ 2354 ON 11/22/2015 BY CAF CONFIRMED BY TLB    Culture (A)  Final    STAPHYLOCOCCUS SPECIES (COAGULASE NEGATIVE) THE SIGNIFICANCE OF  ISOLATING THIS ORGANISM FROM A SINGLE SET OF BLOOD CULTURES WHEN MULTIPLE SETS ARE DRAWN IS UNCERTAIN. PLEASE NOTIFY THE MICROBIOLOGY DEPARTMENT WITHIN ONE WEEK IF SPECIATION AND SENSITIVITIES ARE REQUIRED. Performed at [facility]    Report Status PENDING  Incomplete  Blood Culture ID Panel (Reflexed)     Status: Abnormal   Collection Time: 11/21/15  7:50 PM  Result Value Ref Range Status   Enterococcus species NOT DETECTED NOT DETECTED Final   Vancomycin resistance NOT DETECTED NOT DETECTED Final   Listeria monocytogenes NOT DETECTED NOT DETECTED Final   Staphylococcus species DETECTED (A) NOT DETECTED Final    Comment: CRITICAL RESULT CALLED TO, READ BACK BY AND VERIFIED WITH: MATT MCBANE @ 2354 ON 11/22/2015 BY CAF    Staphylococcus aureus NOT DETECTED NOT DETECTED Final   Methicillin resistance DETECTED (A) NOT DETECTED Final    Comment: CRITICAL RESULT CALLED TO, READ BACK BY AND VERIFIED WITH: MATT MCBANE @ 2354 ON 11/22/2015 BY CAF    Streptococcus species NOT DETECTED NOT DETECTED Final   Streptococcus agalactiae NOT DETECTED NOT DETECTED Final   Streptococcus pneumoniae NOT DETECTED NOT DETECTED Final   Streptococcus pyogenes NOT DETECTED NOT DETECTED Final   Acinetobacter baumannii NOT DETECTED NOT DETECTED Final   Enterobacteriaceae species NOT DETECTED NOT DETECTED Final   Enterobacter cloacae complex NOT DETECTED NOT DETECTED Final   Escherichia coli NOT DETECTED NOT DETECTED Final   Klebsiella oxytoca NOT DETECTED NOT DETECTED Final   Klebsiella pneumoniae NOT DETECTED NOT DETECTED Final   Proteus species NOT DETECTED NOT DETECTED Final   Serratia marcescens NOT DETECTED NOT DETECTED Final   Carbapenem resistance NOT DETECTED NOT DETECTED Final   Haemophilus influenzae NOT DETECTED NOT DETECTED Final   Neisseria meningitidis NOT DETECTED NOT DETECTED Final   Pseudomonas aeruginosa NOT DETECTED NOT DETECTED Final   Candida albicans NOT DETECTED NOT DETECTED  Final   Candida glabrata NOT DETECTED NOT DETECTED Final   Candida krusei NOT DETECTED NOT DETECTED Final   Candida parapsilosis NOT DETECTED NOT DETECTED Final   Candida tropicalis NOT DETECTED NOT DETECTED Final  Urine culture     Status: Abnormal   Collection Time: 11/24/15  4:32 PM  Result Value Ref Range Status   Specimen Description URINE, RANDOM  Final   Special Requests NONE  Final   Culture (A)  Final    <10,000 COLONIES/mL INSIGNIFICANT GROWTH Performed at [facility]    Report Status 11/26/2015 FINAL  Final    RADIOLOGY:  Dg Chest 1 View  11/22/2015  CLINICAL DATA:  Acute onset of generalized weakness. Code sepsis. Initial encounter. EXAM: CHEST 1 VIEW COMPARISON:  Chest radiograph performed 11/21/2015 FINDINGS: The lungs are well-aerated. Mild bibasilar airspace opacities may reflect mild pneumonia. There is no evidence of pleural effusion or pneumothorax. The cardiomediastinal silhouette is enlarged. No acute osseous abnormalities are seen. IMPRESSION: 1. Mild bibasilar opacities may reflect mild pneumonia. 2. Cardiomegaly. Electronically Signed   By: Roanna Raider M.D.   On: 11/22/2015 18:32    EKG:   Orders placed or performed during the hospital encounter of 11/21/15  . ED EKG 12-Lead  . ED  EKG 12-Lead      Management plans discussed with the patient, family and they are in agreement.  CODE STATUS:     Code Status Orders        Start     Ordered   11/21/15 2308  Full code   Continuous     11/21/15 2307    Code Status History    Date Active Date Inactive Code Status Order ID Comments User Context   This patient has a current code status but no historical code status.    Advance Directive Documentation        Most Recent Value   Type of Advance Directive  Healthcare Power of Attorney   Pre-existing out of facility DNR order (yellow form or pink MOST form)     "MOST" Form in Place?        TOTAL TIME TAKING CARE OF THIS PATIENT: 45   minutes.   Note: This dictation was prepared with Dragon dictation along with smaller phrase technology. Any transcriptional errors that result from this process are unintentional.   @MEC @  on 11/26/2015 at 11:23 AM  Between 7am to 6pm - Pager - [phone]  After 6pm go to [email or web address] - password [email or web address] [facility]  [facility]  Office  [phone]  CC: Primary care physician; [phone], MD

## 2015-11-26 NOTE — Care Management Note (Signed)
Case Management Note  Patient Details  Name: Kelsey Chung MRN: [id number] Date of Birth: 03/27/1924  Subjective/Objective:    A referral for home health PT, RN, Aide was faxed to [facility] today. [name] has a RW and was given an Eliquis Coupon earlier this week.                 Action/Plan:   Expected Discharge Date:                  Expected Discharge Plan:     In-House Referral:     Discharge planning Services     Post Acute Care Choice:    Choice offered to:     DME Arranged:    DME Agency:     HH Arranged:    HH Agency:     Status of Service:     Medicare Important Message Given:  Yes Date Medicare IM Given:    Medicare IM give by:    Date Additional Medicare IM Given:    Additional Medicare Important Message give by:     If discussed at Long Length of Stay Meetings, dates discussed:    Additional Comments:  Amy Gothard A, RN 11/26/2015, 11:34 AM

## 2015-11-26 NOTE — Progress Notes (Signed)
Ambulated with patient using walker, walked from side of bed to door and back. Room air sats 98% and HR maintained 65-75 during and post ambulation. Pt tolerated well. No acute distress noted. Family at bedside

## 2015-11-26 NOTE — Progress Notes (Signed)
A&O but HOH. Meds given for pain once during the night. Slept well. Tele is afib, rate controlled. Son at side. Up to [facility] with 2person maximum assist.

## 2015-11-27 ENCOUNTER — Other Ambulatory Visit: Payer: Self-pay

## 2015-11-27 LAB — CULTURE, BLOOD (ROUTINE X 2)

## 2015-11-27 MED ORDER — APIXABAN 2.5 MG PO TABS: 2.5000 mg | ORAL_TABLET | Freq: Two times a day (BID) | ORAL | Status: DC

## 2015-11-28 ENCOUNTER — Telehealth: Payer: Self-pay

## 2015-11-28 ENCOUNTER — Emergency Department: Payer: Medicare Other

## 2015-11-28 ENCOUNTER — Inpatient Hospital Stay
Admission: EM | Admit: 2015-11-28 | Discharge: 2015-12-02 | DRG: 690 | Disposition: A | Discharge status: Home Health | Payer: Medicare Other | Attending: Internal Medicine | Admitting: Internal Medicine

## 2015-11-28 ENCOUNTER — Other Ambulatory Visit: Payer: Self-pay

## 2015-11-28 DIAGNOSIS — Z880 Allergy status to penicillin: Secondary | ICD-10-CM | POA: Diagnosis not present

## 2015-11-28 DIAGNOSIS — Z79899 Other long term (current) drug therapy: Secondary | ICD-10-CM

## 2015-11-28 DIAGNOSIS — K224 Dyskinesia of esophagus: Secondary | ICD-10-CM | POA: Diagnosis present

## 2015-11-28 DIAGNOSIS — M109 Gout, unspecified: Secondary | ICD-10-CM | POA: Diagnosis present

## 2015-11-28 DIAGNOSIS — K5900 Constipation, unspecified: Secondary | ICD-10-CM | POA: Diagnosis present

## 2015-11-28 DIAGNOSIS — M419 Scoliosis, unspecified: Secondary | ICD-10-CM | POA: Diagnosis present

## 2015-11-28 DIAGNOSIS — M069 Rheumatoid arthritis, unspecified: Secondary | ICD-10-CM | POA: Diagnosis present

## 2015-11-28 DIAGNOSIS — N3000 Acute cystitis without hematuria: Secondary | ICD-10-CM | POA: Diagnosis present

## 2015-11-28 DIAGNOSIS — N184 Chronic kidney disease, stage 4 (severe): Secondary | ICD-10-CM | POA: Diagnosis present

## 2015-11-28 DIAGNOSIS — R29898 Other symptoms and signs involving the musculoskeletal system: Secondary | ICD-10-CM

## 2015-11-28 DIAGNOSIS — M1990 Unspecified osteoarthritis, unspecified site: Secondary | ICD-10-CM | POA: Diagnosis present

## 2015-11-28 DIAGNOSIS — R7989 Other specified abnormal findings of blood chemistry: Secondary | ICD-10-CM

## 2015-11-28 DIAGNOSIS — R778 Other specified abnormalities of plasma proteins: Secondary | ICD-10-CM

## 2015-11-28 DIAGNOSIS — Z888 Allergy status to other drugs, medicaments and biological substances status: Secondary | ICD-10-CM | POA: Diagnosis not present

## 2015-11-28 DIAGNOSIS — E785 Hyperlipidemia, unspecified: Secondary | ICD-10-CM | POA: Diagnosis present

## 2015-11-28 DIAGNOSIS — Z7982 Long term (current) use of aspirin: Secondary | ICD-10-CM

## 2015-11-28 DIAGNOSIS — R338 Other retention of urine: Secondary | ICD-10-CM | POA: Diagnosis not present

## 2015-11-28 DIAGNOSIS — R1084 Generalized abdominal pain: Secondary | ICD-10-CM

## 2015-11-28 DIAGNOSIS — A419 Sepsis, unspecified organism: Secondary | ICD-10-CM | POA: Diagnosis not present

## 2015-11-28 DIAGNOSIS — R4182 Altered mental status, unspecified: Secondary | ICD-10-CM | POA: Diagnosis present

## 2015-11-28 DIAGNOSIS — R339 Retention of urine, unspecified: Secondary | ICD-10-CM | POA: Diagnosis present

## 2015-11-28 DIAGNOSIS — I129 Hypertensive chronic kidney disease with stage 1 through stage 4 chronic kidney disease, or unspecified chronic kidney disease: Secondary | ICD-10-CM | POA: Diagnosis present

## 2015-11-28 DIAGNOSIS — Z8673 Personal history of transient ischemic attack (TIA), and cerebral infarction without residual deficits: Secondary | ICD-10-CM

## 2015-11-28 DIAGNOSIS — E1122 Type 2 diabetes mellitus with diabetic chronic kidney disease: Secondary | ICD-10-CM | POA: Diagnosis present

## 2015-11-28 DIAGNOSIS — Z886 Allergy status to analgesic agent status: Secondary | ICD-10-CM

## 2015-11-28 DIAGNOSIS — N179 Acute kidney failure, unspecified: Secondary | ICD-10-CM | POA: Diagnosis present

## 2015-11-28 DIAGNOSIS — Z8249 Family history of ischemic heart disease and other diseases of the circulatory system: Secondary | ICD-10-CM

## 2015-11-28 DIAGNOSIS — N390 Urinary tract infection, site not specified: Secondary | ICD-10-CM | POA: Diagnosis present

## 2015-11-28 DIAGNOSIS — R531 Weakness: Secondary | ICD-10-CM | POA: Diagnosis present

## 2015-11-28 DIAGNOSIS — I4891 Unspecified atrial fibrillation: Secondary | ICD-10-CM | POA: Diagnosis present

## 2015-11-28 LAB — BASIC METABOLIC PANEL
Anion gap: 13 (ref 5–15)
BUN: 52 mg/dL — ABNORMAL HIGH (ref 6–20)
CALCIUM: 9 mg/dL (ref 8.9–10.3)
CHLORIDE: 109 mmol/L (ref 101–111)
CO2: 15 mmol/L — AB (ref 22–32)
CREATININE: 3.28 mg/dL — AB (ref 0.44–1.00)
GFR calc non Af Amer: 12 mL/min — ABNORMAL LOW (ref >60)
GFR, EST AFRICAN AMERICAN: 13 mL/min — AB (ref >60)
Glucose, Bld: 141 mg/dL — ABNORMAL HIGH (ref 65–99)
POTASSIUM: 3.7 mmol/L (ref 3.5–5.1)
SODIUM: 137 mmol/L (ref 135–145)

## 2015-11-28 LAB — CBC
HCT: 29.7 % — ABNORMAL LOW (ref 35.0–47.0)
Hemoglobin: 9.6 g/dL — ABNORMAL LOW (ref 12.0–16.0)
MCH: 33.3 pg (ref 26.0–34.0)
MCHC: 32.2 g/dL (ref 32.0–36.0)
MCV: 103.3 fL — ABNORMAL HIGH (ref 80.0–100.0)
PLATELETS: 438 10*3/uL (ref 150–440)
RBC: 2.87 MIL/uL — AB (ref 3.80–5.20)
RDW: 19.7 % — ABNORMAL HIGH (ref 11.5–14.5)
WBC: 13 10*3/uL — AB (ref 3.6–11.0)

## 2015-11-28 LAB — TROPONIN I: Troponin I: 0.06 ng/mL — ABNORMAL HIGH (ref <0.031)

## 2015-11-28 LAB — HEPATIC FUNCTION PANEL
ALBUMIN: 2.9 g/dL — AB (ref 3.5–5.0)
ALT: 24 U/L (ref 14–54)
AST: 26 U/L (ref 15–41)
Alkaline Phosphatase: 93 U/L (ref 38–126)
Bilirubin, Direct: 0.2 mg/dL (ref 0.1–0.5)
Indirect Bilirubin: 0.4 mg/dL (ref 0.3–0.9)
TOTAL PROTEIN: 6.1 g/dL — AB (ref 6.5–8.1)
Total Bilirubin: 0.6 mg/dL (ref 0.3–1.2)

## 2015-11-28 LAB — URINALYSIS COMPLETE WITH MICROSCOPIC (ARMC ONLY)
BILIRUBIN URINE: NEGATIVE
GLUCOSE, UA: NEGATIVE mg/dL
HGB URINE DIPSTICK: NEGATIVE
Ketones, ur: NEGATIVE mg/dL
LEUKOCYTES UA: NEGATIVE
Nitrite: NEGATIVE
Protein, ur: 30 mg/dL — AB
SPECIFIC GRAVITY, URINE: 1.02 (ref 1.005–1.030)
Trans Epithel, UA: 35
pH: 5 (ref 5.0–8.0)

## 2015-11-28 LAB — BLOOD GAS, VENOUS
ACID-BASE DEFICIT: 5.2 mmol/L — AB (ref 0.0–2.0)
BICARBONATE: 21.6 meq/L (ref 21.0–28.0)
O2 SAT: 51.4 %
PATIENT TEMPERATURE: 37
PO2 VEN: 32 mmHg (ref 31.0–45.0)
pCO2, Ven: 47 mmHg (ref 44.0–60.0)
pH, Ven: 7.27 — ABNORMAL LOW (ref 7.320–7.430)

## 2015-11-28 LAB — GLUCOSE, CAPILLARY
GLUCOSE-CAPILLARY: 138 mg/dL — AB (ref 65–99)
Glucose-Capillary: 167 mg/dL — ABNORMAL HIGH (ref 65–99)

## 2015-11-28 MED ORDER — HYDROXYCHLOROQUINE SULFATE 200 MG PO TABS
200.0000 mg | ORAL_TABLET | Freq: Every day | ORAL | Status: DC
  Administered 2015-11-29 – 2015-12-02 (×4): 200 mg via ORAL
  Filled 2015-11-28 (×6): qty 1

## 2015-11-28 MED ORDER — INSULIN ASPART 100 UNIT/ML ~~LOC~~ SOLN
0.0000 [IU] | Freq: Three times a day (TID) | SUBCUTANEOUS | Status: DC
  Administered 2015-11-28: 19:00:00 2 [IU] via SUBCUTANEOUS
  Administered 2015-11-30: 1 [IU] via SUBCUTANEOUS
  Administered 2015-11-30: 18:00:00 2 [IU] via SUBCUTANEOUS
  Administered 2015-12-01: 1 [IU] via SUBCUTANEOUS
  Administered 2015-12-01: 13:00:00 3 [IU] via SUBCUTANEOUS
  Administered 2015-12-01 – 2015-12-02 (×2): 1 [IU] via SUBCUTANEOUS
  Administered 2015-12-02: 3 [IU] via SUBCUTANEOUS
  Filled 2015-11-28 (×2): qty 1
  Filled 2015-11-28: qty 3
  Filled 2015-11-28: qty 2
  Filled 2015-11-28 (×2): qty 1
  Filled 2015-11-28: qty 2

## 2015-11-28 MED ORDER — APIXABAN 2.5 MG PO TABS
2.5000 mg | ORAL_TABLET | Freq: Two times a day (BID) | ORAL | Status: DC
Start: 2015-11-28 — End: 2015-12-02
  Administered 2015-11-28 – 2015-12-02 (×7): 2.5 mg via ORAL
  Filled 2015-11-28 (×9): qty 1

## 2015-11-28 MED ORDER — GLIPIZIDE 10 MG PO TABS
5.0000 mg | ORAL_TABLET | Freq: Two times a day (BID) | ORAL | Status: DC
  Administered 2015-11-28 – 2015-11-29 (×2): 5 mg via ORAL
  Filled 2015-11-28 (×2): qty 1

## 2015-11-28 MED ORDER — GLUCOSAMINE-CHONDROITIN 500-400 MG PO TABS: 1.0000 | ORAL_TABLET | Freq: Every day | ORAL | Status: DC

## 2015-11-28 MED ORDER — SULFAMETHOXAZOLE-TRIMETHOPRIM 800-160 MG PO TABS: 1.0000 | ORAL_TABLET | Freq: Every day | ORAL | Status: DC

## 2015-11-28 MED ORDER — VITAMIN B-12 1000 MCG PO TABS
1000.0000 ug | ORAL_TABLET | Freq: Every day | ORAL | Status: DC
  Administered 2015-11-29 – 2015-12-02 (×4): 1000 ug via ORAL
  Filled 2015-11-28 (×5): qty 1

## 2015-11-28 MED ORDER — AZTREONAM 1 G IJ SOLR
1.0000 g | Freq: Once | INTRAMUSCULAR | Status: AC
  Administered 2015-11-28: 20:00:00 1 g via INTRAVENOUS
  Filled 2015-11-28: qty 1

## 2015-11-28 MED ORDER — SIMVASTATIN 20 MG PO TABS
20.0000 mg | ORAL_TABLET | Freq: Every day | ORAL | Status: DC
  Administered 2015-11-28: 20 mg via ORAL
  Filled 2015-11-28: qty 1

## 2015-11-28 MED ORDER — PANTOPRAZOLE SODIUM 40 MG PO TBEC
40.0000 mg | DELAYED_RELEASE_TABLET | Freq: Every day | ORAL | Status: DC
  Administered 2015-11-29 – 2015-12-02 (×4): 40 mg via ORAL
  Filled 2015-11-28 (×4): qty 1

## 2015-11-28 MED ORDER — VITAMIN C 500 MG PO TABS
500.0000 mg | ORAL_TABLET | Freq: Every day | ORAL | Status: DC
  Administered 2015-11-29 – 2015-12-02 (×4): 500 mg via ORAL
  Filled 2015-11-28 (×4): qty 1

## 2015-11-28 MED ORDER — VANCOMYCIN HCL IN DEXTROSE 1-5 GM/200ML-% IV SOLN
1000.0000 mg | Freq: Once | INTRAVENOUS | Status: AC
Start: 2015-11-28 — End: 2015-11-28
  Administered 2015-11-28: 1000 mg via INTRAVENOUS
  Filled 2015-11-28: qty 200

## 2015-11-28 MED ORDER — FERROUS SULFATE 325 (65 FE) MG PO TABS
325.0000 mg | ORAL_TABLET | Freq: Every day | ORAL | Status: DC
  Administered 2015-11-29 – 2015-12-02 (×4): 325 mg via ORAL
  Filled 2015-11-28 (×4): qty 1

## 2015-11-28 MED ORDER — ASPIRIN EC 81 MG PO TBEC
81.0000 mg | DELAYED_RELEASE_TABLET | Freq: Every day | ORAL | Status: DC
  Administered 2015-11-28 – 2015-12-02 (×5): 81 mg via ORAL
  Filled 2015-11-28 (×5): qty 1

## 2015-11-28 MED ORDER — LACTULOSE 10 GM/15ML PO SOLN
20.0000 g | Freq: Two times a day (BID) | ORAL | Status: DC
  Administered 2015-11-28 – 2015-12-02 (×5): 20 g via ORAL
  Filled 2015-11-28 (×8): qty 30

## 2015-11-28 MED ORDER — TRAMADOL HCL 50 MG PO TABS
50.0000 mg | ORAL_TABLET | Freq: Three times a day (TID) | ORAL | Status: DC | PRN
  Administered 2015-11-29 (×2): 50 mg via ORAL
  Filled 2015-11-28 (×2): qty 1

## 2015-11-28 MED ORDER — DEXTROSE 5 % IV SOLN
500.0000 mg | INTRAVENOUS | Status: DC
  Filled 2015-11-28: qty 0.5

## 2015-11-28 MED ORDER — ALLOPURINOL 100 MG PO TABS
100.0000 mg | ORAL_TABLET | Freq: Two times a day (BID) | ORAL | Status: DC
  Administered 2015-11-28 – 2015-12-02 (×7): 100 mg via ORAL
  Filled 2015-11-28 (×9): qty 1

## 2015-11-28 MED ORDER — VITAMIN D 1000 UNITS PO TABS
1000.0000 [IU] | ORAL_TABLET | Freq: Every day | ORAL | Status: DC
  Administered 2015-11-29 – 2015-12-02 (×4): 1000 [IU] via ORAL
  Filled 2015-11-28 (×4): qty 1

## 2015-11-28 MED ORDER — METOPROLOL TARTRATE 25 MG PO TABS
12.5000 mg | ORAL_TABLET | Freq: Two times a day (BID) | ORAL | Status: DC
  Administered 2015-11-28 – 2015-12-02 (×7): 12.5 mg via ORAL
  Filled 2015-11-28 (×2): qty 1
  Filled 2015-11-28: qty 2
  Filled 2015-11-28 (×4): qty 1
  Filled 2015-11-28: qty 2

## 2015-11-28 MED ORDER — SODIUM CHLORIDE 0.9 % IV SOLN
Freq: Once | INTRAVENOUS | Status: AC
  Administered 2015-11-28: 15:00:00 via INTRAVENOUS

## 2015-11-28 MED ORDER — GABAPENTIN 300 MG PO CAPS
600.0000 mg | ORAL_CAPSULE | Freq: Three times a day (TID) | ORAL | Status: DC
  Administered 2015-11-28 – 2015-12-02 (×11): 600 mg via ORAL
  Filled 2015-11-28 (×12): qty 2

## 2015-11-28 MED ORDER — DILTIAZEM HCL ER COATED BEADS 120 MG PO CP24
240.0000 mg | ORAL_CAPSULE | Freq: Every day | ORAL | Status: DC
  Administered 2015-11-29 – 2015-12-02 (×4): 240 mg via ORAL
  Filled 2015-11-28 (×4): qty 2

## 2015-11-28 MED ORDER — DEXTROSE 5 % IV SOLN
500.0000 mg | Freq: Three times a day (TID) | INTRAVENOUS | Status: DC
  Administered 2015-11-29 – 2015-12-02 (×10): 500 mg via INTRAVENOUS
  Filled 2015-11-28 (×15): qty 0.5

## 2015-11-28 MED ORDER — VANCOMYCIN HCL 10 G IV SOLR: 1.0000 g | Freq: Once | INTRAVENOUS | Status: DC

## 2015-11-28 MED ORDER — ROPINIROLE HCL 0.5 MG PO TABS
0.5000 mg | ORAL_TABLET | Freq: Every day | ORAL | Status: DC
  Administered 2015-11-28 – 2015-11-30 (×3): 0.5 mg via ORAL
  Filled 2015-11-28 (×5): qty 1

## 2015-11-28 MED ORDER — OMEGA-3-ACID ETHYL ESTERS 1 G PO CAPS
1.0000 g | ORAL_CAPSULE | Freq: Two times a day (BID) | ORAL | Status: DC
  Administered 2015-11-28 – 2015-12-02 (×7): 1 g via ORAL
  Filled 2015-11-28 (×8): qty 1

## 2015-11-28 NOTE — Telephone Encounter (Signed)
Pt has been approved Eliquis 08/30/15-11/27/18.

## 2015-11-28 NOTE — Progress Notes (Signed)
PHARMACIST - PHYSICIAN ORDER COMMUNICATION  CONCERNING: P&T Medication Policy on Herbal Medications  DESCRIPTION:  This patient's order for:  Glucosamine-chondroitin  has been noted.  This product(s) is classified as an "herbal" or natural product. Due to a lack of definitive safety studies or FDA approval, nonstandard manufacturing practices, plus the potential risk of unknown drug-drug interactions while on inpatient medications, the Pharmacy and Therapeutics Committee does not permit the use of "herbal" or natural products of this type within [place].   ACTION TAKEN: The pharmacy department is unable to verify this order at this time and your patient has been informed of this safety policy. Please reevaluate patient's clinical condition at discharge and address if the herbal or natural product(s) should be resumed at that time.   

## 2015-11-28 NOTE — ED Notes (Signed)
Per MD, Catheter is to remain in place

## 2015-11-28 NOTE — ED Notes (Signed)
Pt was discharged on Sunday after dx of sepsis with UTI.. States she was better until today she is having pain and feels like she needs to urinate but only goes a small amount at a time.Marland Kitchen

## 2015-11-28 NOTE — ED Notes (Signed)
Patient transported to CT 

## 2015-11-28 NOTE — ED Notes (Signed)
EDP at bedside  

## 2015-11-28 NOTE — H&P (Signed)
[facility] [place] [facility]   PATIENT NAME: Kelsey Chung    MR#:  [id number]  DATE OF BIRTH:  [birth year]  DATE OF ADMISSION:  11/28/2015  PRIMARY CARE PHYSICIAN: Barbette Reichmann, MD   REQUESTING/REFERRING PHYSICIAN: Ladona Ridgel  CHIEF COMPLAINT:   Chief Complaint  Patient presents with  . Urinary Frequency    HISTORY OF PRESENT ILLNESS: Kelsey Chung  is a 82 y.o. female with a known history of Diabetes, hypertension, rheumatoid arthritis, hyperlipidemia, osteoarthritis, scoliosis, chronic kidney disease, atrial fibrillation diagnosed in June 2017, TIA- is having UTI for last 2 weeks initially she was given some oral antibiotic by primary care physician but stopped due to reaction, then she was admitted on 13th of June and after 5 days of IV antibiotic she was discharged home with oral antibiotics on 18th of June 2017. She had very less urine output and was not eating enough for last 2 days since she went home. She also complained of severe pain in her lower abdomen and her vagina area with urination, so brought to emergency room by daughter as she was advised by her home visiting nurse to do so. In ER she was noted to have UTI again and after placing the catheter she had 500 ml of thickened dirty looking urine came out.  PAST MEDICAL HISTORY:   Past Medical History  Diagnosis Date  . Diabetes (HCC)   . Hypertension   . Rheumatoid arthritis(714.0)   . Hyperlipidemia   . Gout   . Osteoarthritis (arthritis due to wear and tear of joints)   . Scoliosis   . Constipation, chronic   . CKD (chronic kidney disease) stage 4, GFR 15-29 ml/min (HCC)   . New onset atrial fibrillation (HCC)     a. diagnosed in 11/2015 - admitted for sepsis  . TIA (transient ischemic attack)     a. multiple TIA's 20+ years ago according to patient's daughter    PAST SURGICAL HISTORY: Past Surgical History  Procedure Laterality Date  . Hernia repair    . Gallbladder surgery    .  Appendectomy    . Ovarian cyst surgery    . Foot surgery Right   . Eye surgery Bilateral   . Breast cyst excision Left   . Cardiac catheterization    . Colonoscopy    . Esophagogastroduodenoscopy endoscopy    . Dilation and curettage of uterus    . Esophageal manometry N/A 05/24/2015    Procedure: ESOPHAGEAL MANOMETRY (EM);  Surgeon: Elnita Maxwell, MD;  Location: [facility] ENDOSCOPY;  Service: Endoscopy;  Laterality: N/A;    SOCIAL HISTORY:  Social History  Substance Use Topics  . Smoking status: Never Smoker   . Smokeless tobacco: Never Used  . Alcohol Use: No    FAMILY HISTORY:  Family History  Problem Relation Age of Onset  . Breast cancer Mother   . CAD Father     DRUG ALLERGIES:  Allergies  Allergen Reactions  . Augmentin [Amoxicillin-Pot Clavulanate] Anaphylaxis and Other (See Comments)    Has patient had a PCN reaction causing immediate rash, facial/tongue/throat swelling, SOB or lightheadedness with hypotension: Yes Has patient had a PCN reaction causing severe rash involving mucus membranes or skin necrosis: No Has patient had a PCN reaction that required hospitalization No Has patient had a PCN reaction occurring within the last 10 years: Yes If all of the above answers are "NO", then may proceed with Cephalosporin use.  . Beta Adrenergic Blockers Other (See Comments)  Reaction:  Unknown  . Clindamycin Other (See Comments)    Reaction:  Redness to face/swollen eyes   . Excedrin Extra Strength [Aspirin-Acetaminophen-Caffeine] Diarrhea  . Keflex [Cephalexin] Swelling and Other (See Comments)    Reaction:  Lip swelling   . Meloxicam Other (See Comments)    Reaction:  Decreased renal function  . Methotrexate Sodium Other (See Comments)    Reaction:  Decreased renal function  . Nsaids Other (See Comments)    Reaction:  Unknown   . Quinine Rash  . Quinolones Rash    REVIEW OF SYSTEMS:   CONSTITUTIONAL: No fever, fatigue or weakness.  EYES: No blurred or  double vision.  EARS, NOSE, AND THROAT: No tinnitus or ear pain.  RESPIRATORY: No cough, shortness of breath, wheezing or hemoptysis.  CARDIOVASCULAR: No chest pain, orthopnea, edema.  GASTROINTESTINAL: No nausea, vomiting, diarrhea or abdominal pain.  GENITOURINARY: Positive for dysuria, no hematuria.  ENDOCRINE: No polyuria, nocturia,  HEMATOLOGY: No anemia, easy bruising or bleeding SKIN: No rash or lesion. MUSCULOSKELETAL: No joint pain or arthritis.   NEUROLOGIC: No tingling, numbness, weakness.  PSYCHIATRY: No anxiety or depression.   MEDICATIONS AT HOME:  Prior to Admission medications   Medication Sig Start Date End Date Taking? Authorizing Provider  allopurinol (ZYLOPRIM) 100 MG tablet Take 100 mg by mouth 2 (two) times daily.    Yes Historical Provider, MD  apixaban (ELIQUIS) 2.5 MG TABS tablet Take 1 tablet (2.5 mg total) by mouth 2 (two) times daily. 11/27/15  Yes Antonieta Iba, MD  aspirin EC 81 MG tablet Take 81 mg by mouth every evening.    Yes Historical Provider, MD  cholecalciferol (VITAMIN D) 1000 units tablet Take 1,000 Units by mouth daily.   Yes Historical Provider, MD  diltiazem (CARDIZEM CD) 240 MG 24 hr capsule Take 1 capsule (240 mg total) by mouth daily. 11/26/15  Yes Ramonita Lab, MD  ferrous sulfate 325 (65 FE) MG tablet Take 325 mg by mouth daily with breakfast.   Yes Historical Provider, MD  gabapentin (NEURONTIN) 600 MG tablet Take 600 mg by mouth 3 (three) times daily.   Yes Historical Provider, MD  glipiZIDE (GLUCOTROL) 5 MG tablet Take 5 mg by mouth 2 (two) times daily before a meal.    Yes Historical Provider, MD  glucosamine-chondroitin 500-400 MG tablet Take 1 tablet by mouth daily.   Yes Historical Provider, MD  hydrochlorothiazide (HYDRODIURIL) 25 MG tablet Take 25 mg by mouth daily.    Yes Historical Provider, MD  hydroxychloroquine (PLAQUENIL) 200 MG tablet Take 200 mg by mouth daily.   Yes Historical Provider, MD  lactulose (CHRONULAC) 10 GM/15ML  solution Take 20 g by mouth 2 (two) times daily.    Yes Historical Provider, MD  metoprolol tartrate (LOPRESSOR) 25 MG tablet Take 0.5 tablets (12.5 mg total) by mouth 2 (two) times daily. 11/26/15  Yes Ramonita Lab, MD  mupirocin cream (BACTROBAN) 2 % Apply 1 application topically at bedtime as needed (for ulcers on feet).    Yes Historical Provider, MD  omega-3 acid ethyl esters (LOVAZA) 1 g capsule Take 1 g by mouth 2 (two) times daily.   Yes Historical Provider, MD  pantoprazole (PROTONIX) 40 MG tablet Take 40 mg by mouth daily.    Yes Historical Provider, MD  rOPINIRole (REQUIP) 0.5 MG tablet Take 0.5 mg by mouth at bedtime.   Yes Historical Provider, MD  sulfamethoxazole-trimethoprim (BACTRIM DS,SEPTRA DS) 800-160 MG tablet Take 1 tablet by mouth daily. 11/26/15  Yes Ramonita Lab, MD  traMADol (ULTRAM) 50 MG tablet Take 50 mg by mouth every 8 (eight) hours as needed for moderate pain.    Yes Historical Provider, MD  Turmeric 500 MG TABS Take 500 mg by mouth daily.   Yes Historical Provider, MD  vitamin B-12 (CYANOCOBALAMIN) 1000 MCG tablet Take 1,000 mcg by mouth daily.   Yes Historical Provider, MD  vitamin C (ASCORBIC ACID) 500 MG tablet Take 500 mg by mouth daily.   Yes Historical Provider, MD      PHYSICAL EXAMINATION:   VITAL SIGNS: Blood pressure 157/49, pulse 88, temperature 98.2 F (36.8 C), temperature source Oral, resp. rate 20, height 4\' 11"  (1.499 m), weight 79.833 kg (176 lb), SpO2 98 %.  GENERAL:  82 y.o.-year-old patient lying in the bed with no acute distress.  EYES: Pupils equal, round, reactive to light and accommodation. No scleral icterus. Extraocular muscles intact.  HEENT: Head atraumatic, normocephalic. Oropharynx and nasopharynx clear.  NECK:  Supple, no jugular venous distention. No thyroid enlargement, no tenderness.  LUNGS: Normal breath sounds bilaterally, no wheezing, rales,rhonchi or crepitation. No use of accessory muscles of respiration.  CARDIOVASCULAR: S1,  S2 normal, irregular. No murmurs, rubs, or gallops.  ABDOMEN: Soft, nontender, nondistended. Bowel sounds present. No organomegaly or mass. Foley catheter in place with dirty looking urine EXTREMITIES: No pedal edema, cyanosis, or clubbing.  NEUROLOGIC: Cranial nerves II through XII are intact. Muscle strength 5/5 in all extremities. Sensation intact. Gait not checked.  PSYCHIATRIC: The patient is alert and oriented x 3.  SKIN: No obvious rash, lesion, or ulcer.   LABORATORY PANEL:   CBC  Recent Labs Lab 11/21/15 1950 11/22/15 0336 11/23/15 0028 11/24/15 0552 11/28/15 1423  WBC 5.5 3.3* 2.0* 4.2 13.0*  HGB 9.9* 9.8* 8.0* 8.7* 9.6*  HCT 30.5* 29.8* 24.6* 26.4* 29.7*  PLT 280 271 235 291 438  MCV 106.8* 104.7* 103.1* 104.1* 103.3*  MCH 34.6* 34.5* 33.6 34.2* 33.3  MCHC 32.4 33.0 32.6 32.8 32.2  RDW 20.2* 19.5* 18.5* 19.0* 19.7*  LYMPHSABS 0.4*  --   --   --   --   MONOABS 0.6  --   --   --   --   EOSABS 0.1  --   --   --   --   BASOSABS 0.1  --   --   --   --    ------------------------------------------------------------------------------------------------------------------  Chemistries   Recent Labs Lab 11/21/15 1950 11/22/15 0336 11/23/15 0028 11/26/15 0545 11/28/15 1423  NA 139 141 141 140  --   K 4.5 4.4 3.3* 4.3  --   CL 107 112* 113* 110  --   CO2 22 19* 19* 22  --   GLUCOSE 167* 129* 141* 124*  --   BUN 29* 29* 30* 40*  --   CREATININE 1.72* 1.77* 1.49* 1.84*  --   CALCIUM 9.0 8.4* 8.1* 9.0  --   AST 25  --   --   --  26  ALT 20  --   --   --  24  ALKPHOS 113  --   --   --  93  BILITOT 0.3  --   --   --  0.6   ------------------------------------------------------------------------------------------------------------------ estimated creatinine clearance is 18.9 mL/min (by C-G formula based on Cr of 1.84). ------------------------------------------------------------------------------------------------------------------ No results for input(s): TSH,  T4TOTAL, T3FREE, THYROIDAB in the last 72 hours.  Invalid input(s): FREET3   Coagulation profile  Recent Labs Lab 11/21/15  1950  INR 1.03   ------------------------------------------------------------------------------------------------------------------- No results for input(s): DDIMER in the last 72 hours. -------------------------------------------------------------------------------------------------------------------  Cardiac Enzymes  Recent Labs Lab 11/22/15 0336 11/22/15 0821 11/28/15 1423  TROPONINI 0.23* 0.23* 0.06*   ------------------------------------------------------------------------------------------------------------------ Invalid input(s): POCBNP  ---------------------------------------------------------------------------------------------------------------  Urinalysis    Component Value Date/Time   COLORURINE YELLOW* 11/28/2015 1423   COLORURINE Amber 01/11/2014 1544   APPEARANCEUR TURBID* 11/28/2015 1423   APPEARANCEUR Cloudy 01/11/2014 1544   LABSPEC 1.020 11/28/2015 1423   LABSPEC 1.015 01/11/2014 1544   PHURINE 5.0 11/28/2015 1423   PHURINE 5.0 01/11/2014 1544   GLUCOSEU NEGATIVE 11/28/2015 1423   GLUCOSEU Negative 01/11/2014 1544   HGBUR NEGATIVE 11/28/2015 1423   BILIRUBINUR NEGATIVE 11/28/2015 1423   KETONESUR NEGATIVE 11/28/2015 1423   KETONESUR Trace 01/11/2014 1544   PROTEINUR 30* 11/28/2015 1423   PROTEINUR Negative 01/11/2014 1544   NITRITE NEGATIVE 11/28/2015 1423   NITRITE Negative 01/11/2014 1544   LEUKOCYTESUR NEGATIVE 11/28/2015 1423   LEUKOCYTESUR Negative 01/11/2014 1544     RADIOLOGY: Dg Chest 1 View  11/28/2015  CLINICAL DATA:  Chest pain today. EXAM: CHEST 1 VIEW COMPARISON:  11/22/2015 FINDINGS: The heart is enlarged but stable. There is tortuosity and calcification of the thoracic aorta. The lungs demonstrate mild chronic bronchitic changes and streaky atelectasis but no definite infiltrates or effusions. The bony  thorax is intact. IMPRESSION: 1. Chronic bronchitic changes and streaky areas of subsegmental atelectasis but no definite infiltrates or effusions. 2. Stable mild cardiac enlargement. 3. Stable aortic atherosclerosis. Electronically Signed   By: Rudie Meyer M.D.   On: 11/28/2015 15:02    EKG: Orders placed or performed during the hospital encounter of 11/28/15  . EKG 12-Lead  . EKG 12-Lead    IMPRESSION AND PLAN:  * UTI   This is recurrent for last 2-3 weeks,  not properly treated.   We'll give IV aztreonam as she has multiple antibiotic allergies.   Urine culture is sent, follow for adjusting the antibiotics.   a renal CT is to check for any urological abnormalities.    * Atrial fibrillation   On rate controlling medications and Eliquis   Recently seen cardiologist 4 days ago in previous admission.  * Chronic renal failure  Stable, at baseline 2 days ago, waiting on result today.  * Hyperlipidemia  Continue statin.  * Arthritis  Continue home medications.  * Diabetes  on insulin sliding scale coverage.  All the records are reviewed and case discussed with ED provider. Management plans discussed with the patient, family and they are in agreement.  CODE STATUS: full Code Status History    Date Active Date Inactive Code Status Order ID Comments User Context   11/21/2015 11:08 PM 11/26/2015  3:53 PM Full Code [id number]  Altamese Dilling, MD ED    Advance Directive Documentation        Most Recent Value   Type of Advance Directive  Healthcare Power of Attorney   Pre-existing out of facility DNR order (yellow form or pink MOST form)     "MOST" Form in Place?         TOTAL TIME TAKING CARE OF THIS PATIENT: 50 minutes.    Altamese Dilling M.D on 11/28/2015   Between 7am to 6pm - Pager - [phone]  After 6pm go to [email or web address] - password [email or web address]  [facility]  Office  [phone]  CC: Primary care physician; Barbette Reichmann,  MD   Note: This dictation was prepared with Dragon dictation along  with smaller phrase technology. Any transcriptional errors that result from this process are unintentional.

## 2015-11-28 NOTE — ED Provider Notes (Addendum)
[facility] Emergency Department Provider Note   ____________________________________________  Time seen: Approximately 2:34 PM  I have reviewed the triage vital signs and the nursing notes.   HISTORY  Chief Complaint Urinary Frequency    HPI Kelsey Chung is a 82 y.o. female who was with daughter complaining that she has had worsening weakness and lethargy for the past 2 days since she was sent home from the hospital status post urinary sepsis, and today patient complaining of abdominal pain especially in her lower abdomen and felt like that she cannot urinate. Daughter states that she hasn't noticed another fever, but when she was sent home from the hospital this past Sunday she hasn't been eating or drinking. Patient was placed on Septra DS for her urinary tract infection. Patient is somewhat of a poor historian right now but does not offer any other complaints.   Past Medical History  Diagnosis Date  . Diabetes (HCC)   . Hypertension   . Rheumatoid arthritis(714.0)   . Hyperlipidemia   . Gout   . Osteoarthritis (arthritis due to wear and tear of joints)   . Scoliosis   . Constipation, chronic   . CKD (chronic kidney disease) stage 4, GFR 15-29 ml/min (HCC)   . New onset atrial fibrillation (HCC)     a. diagnosed in 11/2015 - admitted for sepsis  . TIA (transient ischemic attack)     a. multiple TIA's 20+ years ago according to patient's daughter    Patient Active Problem List   Diagnosis Date Noted  . Atrial fibrillation with rapid ventricular response (HCC) 11/22/2015  . Demand myocardial infarction (HCC) - Type II MI in setting of Sepsis 11/22/2015    Class: Diagnosis of  . New onset atrial fibrillation (HCC) 11/22/2015  . Sepsis secondary to UTI (HCC)   . Sepsis (HCC) 11/21/2015  . UTI (lower urinary tract infection) 11/21/2015  . Absolute anemia 08/30/2015  . Arthritis, degenerative 08/30/2015  . CKD (chronic kidney disease) stage  4, GFR 15-29 ml/min (HCC) 08/30/2015  . Rheumatoid arthritis (HCC) 08/30/2015  . Essential (primary) hypertension 03/06/2015    Past Surgical History  Procedure Laterality Date  . Hernia repair    . Gallbladder surgery    . Appendectomy    . Ovarian cyst surgery    . Foot surgery Right   . Eye surgery Bilateral   . Breast cyst excision Left   . Cardiac catheterization    . Colonoscopy    . Esophagogastroduodenoscopy endoscopy    . Dilation and curettage of uterus    . Esophageal manometry N/A 05/24/2015    Procedure: ESOPHAGEAL MANOMETRY (EM);  Surgeon: Elnita Maxwell, MD;  Location: [facility] ENDOSCOPY;  Service: Endoscopy;  Laterality: N/A;    Current Outpatient Rx  Name  Route  Sig  Dispense  Refill  . allopurinol (ZYLOPRIM) 100 MG tablet   Oral   Take 100 mg by mouth 2 (two) times daily.          Marland Kitchen apixaban (ELIQUIS) 2.5 MG TABS tablet   Oral   Take 1 tablet (2.5 mg total) by mouth 2 (two) times daily.   60 tablet   1   . aspirin EC 81 MG tablet   Oral   Take 81 mg by mouth daily.         . cholecalciferol (VITAMIN D) 1000 units tablet   Oral   Take 1,000 Units by mouth daily.         Marland Kitchen  diltiazem (CARDIZEM CD) 240 MG 24 hr capsule   Oral   Take 1 capsule (240 mg total) by mouth daily.   30 capsule   0   . ferrous sulfate 325 (65 FE) MG tablet   Oral   Take 325 mg by mouth daily with breakfast.         . gabapentin (NEURONTIN) 600 MG tablet   Oral   Take 600 mg by mouth 3 (three) times daily.         Marland Kitchen glipiZIDE (GLUCOTROL) 5 MG tablet   Oral   Take 5 mg by mouth 2 (two) times daily before a meal.          . glucosamine-chondroitin 500-400 MG tablet   Oral   Take 1 tablet by mouth daily.         . hydrochlorothiazide (HYDRODIURIL) 25 MG tablet   Oral   Take 25 mg by mouth daily.          . hydroxychloroquine (PLAQUENIL) 200 MG tablet   Oral   Take 200 mg by mouth daily.         Marland Kitchen lactulose (CHRONULAC) 10 GM/15ML solution    Oral   Take 20 g by mouth 2 (two) times daily.          . metoprolol tartrate (LOPRESSOR) 25 MG tablet   Oral   Take 0.5 tablets (12.5 mg total) by mouth 2 (two) times daily.   60 tablet   0   . mupirocin cream (BACTROBAN) 2 %   Topical   Apply 1 application topically at bedtime.         Marland Kitchen omega-3 acid ethyl esters (LOVAZA) 1 g capsule   Oral   Take 1 g by mouth 2 (two) times daily.         . pantoprazole (PROTONIX) 40 MG tablet   Oral   Take 40 mg by mouth daily.          Marland Kitchen rOPINIRole (REQUIP) 0.5 MG tablet   Oral   Take 0.5 mg by mouth at bedtime.         . simvastatin (ZOCOR) 20 MG tablet   Oral   Take 20 mg by mouth at bedtime.          . sulfamethoxazole-trimethoprim (BACTRIM DS,SEPTRA DS) 800-160 MG tablet   Oral   Take 1 tablet by mouth daily.   14 tablet   0   . traMADol (ULTRAM) 50 MG tablet   Oral   Take 50 mg by mouth every 8 (eight) hours as needed for moderate pain.          . Turmeric 500 MG TABS   Oral   Take 500 mg by mouth daily.         . vitamin B-12 (CYANOCOBALAMIN) 1000 MCG tablet   Oral   Take 1,000 mcg by mouth daily.         . vitamin C (ASCORBIC ACID) 500 MG tablet   Oral   Take 500 mg by mouth daily.           Allergies Augmentin; Beta adrenergic blockers; Clindamycin; Excedrin extra strength; Keflex; Meloxicam; Methotrexate sodium; Nsaids; Quinine; and Quinolones  Family History  Problem Relation Age of Onset  . Breast cancer Mother   . CAD Father     Social History Social History  Substance Use Topics  . Smoking status: Never Smoker   . Smokeless tobacco: Never Used  . Alcohol Use: No  Review of Systems Constitutional: No fever/chills Eyes: No visual changes. ENT: No sore throat. Cardiovascular: Denies chest pain. Respiratory: Denies shortness of breath. Gastrointestinal: Positive for abdominal pain.  No nausea, no vomiting.  No diarrhea.  No constipation. Genitourinary: Negative for  dysuria. Musculoskeletal: Negative for back pain. Skin: Negative for rash. Neurological: Negative for headaches, focal weakness or numbness. Positive for altered mental status and generalized weakness  10-point ROS otherwise negative.  ____________________________________________   PHYSICAL EXAM:  VITAL SIGNS: ED Triage Vitals  Enc Vitals Group     BP 11/28/15 1345 157/49 mmHg     Pulse Rate 11/28/15 1345 88     Resp 11/28/15 1345 20     Temp 11/28/15 1345 98.2 F (36.8 C)     Temp Source 11/28/15 1345 Oral     SpO2 11/28/15 1345 98 %     Weight 11/28/15 1345 176 lb (79.833 kg)     Height 11/28/15 1345 4\' 11"  (1.499 m)     Head Cir --      Peak Flow --      Pain Score 11/28/15 1345 10     Pain Loc --      Pain Edu? --      Excl. in GC? --     Constitutional: Alert and oriented. Well appearing and in no acute distress. Eyes: Conjunctivae are normal. PERRL. EOMI. Head: Atraumatic. Nose: No congestion/rhinnorhea. Mouth/Throat: Mucous membranes are moist.  Oropharynx non-erythematous. Neck: No stridor.   Cardiovascular: Normal rate, regular rhythm. 2/6 systolic ejection murmur.  Good peripheral circulation. Respiratory: Normal respiratory effort.  No retractions. Lungs CTAB. Gastrointestinal: Soft and nontender. No distention. No abdominal bruits. No CVA tenderness. Musculoskeletal: No lower extremity tenderness nor edema.  No joint effusions. Neurologic:  Normal speech and language. Patient was with generalized weakness all over" and lethargic. Patient only complains of pain in her mid abdomen. No gross focal neurologic deficits are appreciated. No gait instability. Skin:  Skin is warm, dry and intact. No rash noted. Psychiatric: Mood and affect are depressed Speech and behavior are slow.  ____________________________________________   LABS (all labs ordered are listed, but only abnormal results are displayed)  Labs Reviewed  URINALYSIS COMPLETEWITH MICROSCOPIC ([facility]  ONLY) - Abnormal; Notable for the following:    Color, Urine YELLOW (*)    APPearance TURBID (*)    Protein, ur 30 (*)    Bacteria, UA FEW (*)    Squamous Epithelial / LPF TOO NUMEROUS TO COUNT (*)    All other components within normal limits  CBC - Abnormal; Notable for the following:    WBC 13.0 (*)    RBC 2.87 (*)    Hemoglobin 9.6 (*)    HCT 29.7 (*)    MCV 103.3 (*)    RDW 19.7 (*)    All other components within normal limits  HEPATIC FUNCTION PANEL - Abnormal; Notable for the following:    Total Protein 6.1 (*)    Albumin 2.9 (*)    All other components within normal limits  BLOOD GAS, VENOUS - Abnormal; Notable for the following:    pH, Ven 7.27 (*)    Acid-base deficit 5.2 (*)    All other components within normal limits  TROPONIN I - Abnormal; Notable for the following:    Troponin I 0.06 (*)    All other components within normal limits  CULTURE, BLOOD (ROUTINE X 2)  CULTURE, BLOOD (ROUTINE X 2)  URINE CULTURE  BASIC METABOLIC PANEL  ____________________________________________  EKG ED ECG REPORT I, Leona Carry, the attending physician, personally viewed and interpreted this ECG.   Date: 11/28/2015  EKG Time: 1436  Rate: 69  Rhythm: atrial fibrillation, rate 69  Axis: Right axis deviation  Intervals:nonspecific intraventricular conduction delay  ST&T Change: ST depression and flipped T waves inferiorly and laterally   ____________________________________________  RADIOLOGY Dg Chest 1 View  11/28/2015  CLINICAL DATA:  Chest pain today. EXAM: CHEST 1 VIEW COMPARISON:  11/22/2015 FINDINGS: The heart is enlarged but stable. There is tortuosity and calcification of the thoracic aorta. The lungs demonstrate mild chronic bronchitic changes and streaky atelectasis but no definite infiltrates or effusions. The bony thorax is intact. IMPRESSION: 1. Chronic bronchitic changes and streaky areas of subsegmental atelectasis but no definite infiltrates or effusions.  2. Stable mild cardiac enlargement. 3. Stable aortic atherosclerosis. Electronically Signed   By: Rudie Meyer M.D.   On: 11/28/2015 15:02   Dg Chest 1 View  11/22/2015  CLINICAL DATA:  Acute onset of generalized weakness. Code sepsis. Initial encounter. EXAM: CHEST 1 VIEW COMPARISON:  Chest radiograph performed 11/21/2015 FINDINGS: The lungs are well-aerated. Mild bibasilar airspace opacities may reflect mild pneumonia. There is no evidence of pleural effusion or pneumothorax. The cardiomediastinal silhouette is enlarged. No acute osseous abnormalities are seen. IMPRESSION: 1. Mild bibasilar opacities may reflect mild pneumonia. 2. Cardiomegaly. Electronically Signed   By: Roanna Raider M.D.   On: 11/22/2015 18:32   Dg Chest Port 1 View  11/21/2015  CLINICAL DATA:  Family states pt ate some ice cream tonight then went to the bathroom and became weak with uncontrolled shaking. New onset of Afib. History of HTN, stroke, diabetic EXAM: PORTABLE CHEST 1 VIEW COMPARISON:  01/11/2014 FINDINGS: Cardiac silhouette is mildly enlarged. No mediastinal or hilar masses or evidence of adenopathy. Clear lungs. No pleural effusion or pneumothorax. The bony thorax is demineralized but grossly intact. IMPRESSION: No acute cardiopulmonary disease. Electronically Signed   By: Amie Portland M.D.   On: 11/21/2015 20:05    ____________________________________________   PROCEDURES  Procedure(s) performed: None  Critical Care performed: No  ____________________________________________   INITIAL IMPRESSION / ASSESSMENT AND PLAN / ED COURSE  Pertinent labs & imaging results that were available during my care of the patient were reviewed by me and considered in my medical decision making (see chart for details).  3:26 PM Patient had a Foley catheter placed which showed an extremely milky very thick cloudy urine. Urine was sent for urinalysis. We will let the Foley in place since patient was unable to urinate and  got over a 500 ml of urine.  3:26 PM Still awaiting lactic acid to make sure the patient is not septic. Patient will be started on IV antibiotics, Zosyn and vancomycin. Blood cultures were sent. The hospitalist is going to admit. Patient still awaiting the results of her CT abdomen pelvis for stone study as well. Also patient's troponin was mildly elevated. ____________________________________________   FINAL CLINICAL IMPRESSION(S) / ED DIAGNOSES  Final diagnoses:  Altered mental status, unspecified altered mental status type  Acute UTI  Urinary retention  Generalized abdominal pain  Elevated troponin      NEW MEDICATIONS STARTED DURING THIS VISIT:  New Prescriptions   No medications on file     Note:  This document was prepared using Dragon voice recognition software and may include unintentional dictation errors.     Leona Carry, MD 11/28/15 2595  Leona Carry, MD 11/28/15  1526 

## 2015-11-28 NOTE — Telephone Encounter (Signed)
Prior Auth for Eliquis 2.5 mg sent to CoverMyMeds. Awaiting approval.

## 2015-11-28 NOTE — Progress Notes (Signed)
Pharmacy Antibiotic Note  Kelsey Chung is a 82 y.o. female admitted on 11/28/2015 with UTI.  Pharmacy has been consulted for Aztreonam dosing. (Pt just d/c from [facility] 6/18 on Septra po) Patient w/ allergy to PCN, Cephlasporins, Quinolones, Clindamycin.  Plan: Patient received Aztreonam 1 gram IV x 1. The dose of Aztreonam will be adjusted to 500mg  IV q8h based on renal function. for Crcl 10-30 ml/min    Height: 4\' 11"  (149.9 cm) Weight: 176 lb (79.833 kg) IBW/kg (Calculated) : 43.2  Temp (24hrs), Avg:97.9 F (36.6 C), Min:97.5 F (36.4 C), Max:98.2 F (36.8 C)   Recent Labs Lab 11/21/15 1950 11/21/15 2337 11/22/15 0336 11/23/15 0028 11/24/15 0552 11/26/15 0545 11/28/15 1423  WBC 5.5  --  3.3* 2.0* 4.2  --  13.0*  CREATININE 1.72*  --  1.77* 1.49*  --  1.84* 3.28*  LATICACIDVEN 3.1* 2.4*  --   --   --   --   --     Estimated Creatinine Clearance: 10.6 mL/min (by C-G formula based on Cr of 3.28).    Allergies  Allergen Reactions  . Augmentin [Amoxicillin-Pot Clavulanate] Anaphylaxis and Other (See Comments)    Has patient had a PCN reaction causing immediate rash, facial/tongue/throat swelling, SOB or lightheadedness with hypotension: Yes Has patient had a PCN reaction causing severe rash involving mucus membranes or skin necrosis: No Has patient had a PCN reaction that required hospitalization No Has patient had a PCN reaction occurring within the last 10 years: Yes If all of the above answers are "NO", then may proceed with Cephalosporin use.  . Beta Adrenergic Blockers Other (See Comments)    Reaction:  Unknown  . Clindamycin Other (See Comments)    Reaction:  Redness to face/swollen eyes   . Excedrin Extra Strength [Aspirin-Acetaminophen-Caffeine] Diarrhea  . Keflex [Cephalexin] Swelling and Other (See Comments)    Reaction:  Lip swelling   . Meloxicam Other (See Comments)    Reaction:  Decreased renal function  . Methotrexate Sodium Other (See Comments)   Reaction:  Decreased renal function  . Nsaids Other (See Comments)    Reaction:  Unknown   . Quinine Rash  . Quinolones Rash    Antimicrobials this admission: Vanc x 1 in ER 6/20 Aztreonam 6/20 >>       >>    Dose adjustments this admission:    Microbiology results: 6/20 BCx: P 6/20 UCx: P    Sputum:      MRSA PCR:    Thank you for allowing pharmacy to be a part of this patient's care.  Braylan Faul A 11/28/2015 6:32 PM

## 2015-11-29 LAB — CBC
HEMATOCRIT: 25.7 % — AB (ref 35.0–47.0)
HEMOGLOBIN: 8.5 g/dL — AB (ref 12.0–16.0)
MCH: 33.6 pg (ref 26.0–34.0)
MCHC: 33 g/dL (ref 32.0–36.0)
MCV: 102 fL — AB (ref 80.0–100.0)
Platelets: 405 10*3/uL (ref 150–440)
RBC: 2.52 MIL/uL — AB (ref 3.80–5.20)
RDW: 19.3 % — ABNORMAL HIGH (ref 11.5–14.5)
WBC: 13.6 10*3/uL — AB (ref 3.6–11.0)

## 2015-11-29 LAB — BASIC METABOLIC PANEL
Anion gap: 7 (ref 5–15)
BUN: 50 mg/dL — ABNORMAL HIGH (ref 6–20)
CHLORIDE: 111 mmol/L (ref 101–111)
CO2: 21 mmol/L — ABNORMAL LOW (ref 22–32)
Calcium: 8.6 mg/dL — ABNORMAL LOW (ref 8.9–10.3)
Creatinine, Ser: 2.9 mg/dL — ABNORMAL HIGH (ref 0.44–1.00)
GFR calc Af Amer: 16 mL/min — ABNORMAL LOW (ref >60)
GFR, EST NON AFRICAN AMERICAN: 13 mL/min — AB (ref >60)
Glucose, Bld: 104 mg/dL — ABNORMAL HIGH (ref 65–99)
Potassium: 3.4 mmol/L — ABNORMAL LOW (ref 3.5–5.1)
SODIUM: 139 mmol/L (ref 135–145)

## 2015-11-29 LAB — URINE CULTURE: CULTURE: NO GROWTH

## 2015-11-29 LAB — GLUCOSE, CAPILLARY
GLUCOSE-CAPILLARY: 113 mg/dL — AB (ref 65–99)
Glucose-Capillary: 83 mg/dL (ref 65–99)
Glucose-Capillary: 111 mg/dL — ABNORMAL HIGH (ref 65–99)
Glucose-Capillary: 120 mg/dL — ABNORMAL HIGH (ref 65–99)

## 2015-11-29 MED ORDER — ATORVASTATIN CALCIUM 10 MG PO TABS
10.0000 mg | ORAL_TABLET | Freq: Every day | ORAL | Status: DC
  Administered 2015-11-29 – 2015-12-01 (×3): 10 mg via ORAL
  Filled 2015-11-29 (×3): qty 1

## 2015-11-29 MED ORDER — ACETAMINOPHEN 325 MG PO TABS
650.0000 mg | ORAL_TABLET | ORAL | Status: DC | PRN
  Administered 2015-12-02: 650 mg via ORAL
  Filled 2015-11-29: qty 2

## 2015-11-29 NOTE — Progress Notes (Signed)
[facility] [place] [facility]   PATIENT NAME: Kelsey Chung    MR#:  [id number]  DATE OF BIRTH:  [birth year]  SUBJECTIVE:  CHIEF COMPLAINT:  Patient is resting comfortably. Denies any nausea or vomiting. Denies any abdominal discomfort Reporting urinary frequency  REVIEW OF SYSTEMS:  CONSTITUTIONAL: No fever, fatigue or weakness.  EYES: No blurred or double vision.  EARS, NOSE, AND THROAT: No tinnitus or ear pain.  RESPIRATORY: No cough, shortness of breath, wheezing or hemoptysis.  CARDIOVASCULAR: No chest pain, orthopnea, edema.  GASTROINTESTINAL: No nausea, vomiting, diarrhea or abdominal pain.  GENITOURINARY: No dysuria, hematuria.  ENDOCRINE: No polyuria, nocturia,  HEMATOLOGY: No anemia, easy bruising or bleeding SKIN: No rash or lesion. MUSCULOSKELETAL: No joint pain or arthritis.   NEUROLOGIC: No tingling, numbness, weakness.  PSYCHIATRY: No anxiety or depression.   DRUG ALLERGIES:   Allergies  Allergen Reactions  . Augmentin [Amoxicillin-Pot Clavulanate] Anaphylaxis and Other (See Comments)    Has patient had a PCN reaction causing immediate rash, facial/tongue/throat swelling, SOB or lightheadedness with hypotension: Yes Has patient had a PCN reaction causing severe rash involving mucus membranes or skin necrosis: No Has patient had a PCN reaction that required hospitalization No Has patient had a PCN reaction occurring within the last 10 years: Yes If all of the above answers are "NO", then may proceed with Cephalosporin use.  . Beta Adrenergic Blockers Other (See Comments)    Reaction:  Unknown  . Clindamycin Other (See Comments)    Reaction:  Redness to face/swollen eyes   . Excedrin Extra Strength [Aspirin-Acetaminophen-Caffeine] Diarrhea  . Keflex [Cephalexin] Swelling and Other (See Comments)    Reaction:  Lip swelling   . Meloxicam Other (See Comments)    Reaction:  Decreased renal function  . Methotrexate Sodium Other (See  Comments)    Reaction:  Decreased renal function  . Nsaids Other (See Comments)    Reaction:  Unknown   . Quinine Rash  . Quinolones Rash    VITALS:  Blood pressure 160/55, pulse 147, temperature 98.2 F (36.8 C), temperature source Oral, resp. rate 20, height 4\' 11"  (1.499 m), weight 79.833 kg (176 lb), SpO2 99 %.  PHYSICAL EXAMINATION:  GENERAL:  82 y.o.-year-old patient lying in the bed with no acute distress.  EYES: Pupils equal, round, reactive to light and accommodation. No scleral icterus. Extraocular muscles intact.  HEENT: Head atraumatic, normocephalic. Oropharynx and nasopharynx clear.  NECK:  Supple, no jugular venous distention. No thyroid enlargement, no tenderness.  LUNGS: Normal breath sounds bilaterally, no wheezing, rales,rhonchi or crepitation. No use of accessory muscles of respiration.  CARDIOVASCULAR: S1, S2 normal. No murmurs, rubs, or gallops.  ABDOMEN: Soft, nontender, nondistended. Bowel sounds present. No organomegaly or mass.  EXTREMITIES: No pedal edema, cyanosis, or clubbing.  NEUROLOGIC: Cranial nerves II through XII are intact. Muscle strength 5/5 in all extremities. Sensation intact. Gait not checked.  PSYCHIATRIC: The patient is alert and oriented x 3.  SKIN: No obvious rash, lesion, or ulcer.    LABORATORY PANEL:   CBC  Recent Labs Lab 11/29/15 0502  WBC 13.6*  HGB 8.5*  HCT 25.7*  PLT 405   ------------------------------------------------------------------------------------------------------------------  Chemistries   Recent Labs Lab 11/28/15 1423 11/29/15 0502  NA 137 139  K 3.7 3.4*  CL 109 111  CO2 15* 21*  GLUCOSE 141* 104*  BUN 52* 50*  CREATININE 3.28* 2.90*  CALCIUM 9.0 8.6*  AST 26  --   ALT 24  --  ALKPHOS 93  --   BILITOT 0.6  --    ------------------------------------------------------------------------------------------------------------------  Cardiac Enzymes  Recent Labs Lab 11/28/15 1423  TROPONINI  0.06*   ------------------------------------------------------------------------------------------------------------------  RADIOLOGY:  Dg Chest 1 View  11/28/2015  CLINICAL DATA:  Chest pain today. EXAM: CHEST 1 VIEW COMPARISON:  11/22/2015 FINDINGS: The heart is enlarged but stable. There is tortuosity and calcification of the thoracic aorta. The lungs demonstrate mild chronic bronchitic changes and streaky atelectasis but no definite infiltrates or effusions. The bony thorax is intact. IMPRESSION: 1. Chronic bronchitic changes and streaky areas of subsegmental atelectasis but no definite infiltrates or effusions. 2. Stable mild cardiac enlargement. 3. Stable aortic atherosclerosis. Electronically Signed   By: Rudie Meyer M.D.   On: 11/28/2015 15:02   Ct Renal Stone Study  11/28/2015  CLINICAL DATA:  Worsening weakness and lethargy status post treated urinary sepsis. Abdominal pain. Difficulty urinating. EXAM: CT ABDOMEN AND PELVIS WITHOUT CONTRAST TECHNIQUE: Multidetector CT imaging of the abdomen and pelvis was performed following the standard protocol without IV contrast. COMPARISON:  Renal ultrasound 01/12/2014 FINDINGS: Lower chest: Evaluation of lung parenchyma is precluded by breathing motion artifact. There are bilateral small pleural effusions with associated compressive bibasilar atelectasis. Mitral and aortic valve annular calcifications. Hepatobiliary: No mass visualized on this un-enhanced exam. Postcholecystectomy. Pancreas: No mass or inflammatory process identified on this un-enhanced exam. Spleen: Within normal limits in size. Adrenals/Urinary Tract: No evidence of urolithiasis or hydronephrosis. No definite mass visualized on this un-enhanced exam. Mildly atrophic kidneys. Stomach/Bowel: No evidence of obstruction, inflammatory process, or abnormal fluid collections. Vascular/Lymphatic: No pathologically enlarged lymph nodes. No evidence of abdominal aortic aneurysm. Mild diffuse  mesenteric stranding. Advanced calcific atherosclerotic disease of the aorta and its main attributes. Reproductive: No mass or other significant abnormality. The urinary bladder is not well evaluated as it is decompressed around urinary Foley. Other: Chest wall edema. Musculoskeletal: Reversed S shaped thoraco lumbar spine scoliosis with advanced osteoarthritic changes. No suspicious osseous lesions. IMPRESSION: Bilateral pleural effusions with bibasilar hypoventilatory changes. No evidence of acute abnormality within the solid abdominal organs. Somewhat atrophic kidneys. Diffuse mesenteric stranding and chest and abdominal wall edema, likely due to third spacing. Advanced calcific atherosclerotic disease of the aorta. Mitral and aortic valve annular calcifications. Electronically Signed   By: Ted Mcalpine M.D.   On: 11/28/2015 16:04    EKG:   Orders placed or performed during the hospital encounter of 11/28/15  . EKG 12-Lead  . EKG 12-Lead    ASSESSMENT AND PLAN:    * Acute cystitis  This is recurrent for last 2-3 weeks  We'll continue IV aztreonam as she has multiple antibiotic allergies.  Urine culture is sent, reveals no growth so far. Patient might not show any growth as she was treated with IV aztreonam recently and was discharged with by mouth Bactrim  a renal CT has revealed bilateral pleural effusions with bibasilar hypoventilatory changes no acute abnormalities , somewhat atrophic kidneys During previous admission patient was treated with aztreonam and discharged home with by mouth Bactrim for 14 days per ID recommendations. We will reconsult infectious disease   * Atrial fibrillation  On rate controlling medications and Eliquis  Recently seen cardiologist 4 days ago in previous admission.  *Chronic history  of esophageal spasms Outpatient follow-up with [facility] gastroenterology as recommended Continue current diet as tolerated  * Chronic renal failure  Stable, at  baseline 2 days ago, waiting on result today.  * Hyperlipidemia  Continue statin.  *  Arthritis  Continue home medications.  * Diabetes on insulin sliding scale coverage.    All the records are reviewed and case discussed with Care Management/Social Workerr. Management plans discussed with the patient, daughter and they are in agreement.  CODE STATUS: fc   TOTAL TIME TAKING CARE OF THIS PATIENT: 36  minutes.   POSSIBLE D/C IN 2  DAYS, DEPENDING ON CLINICAL CONDITION.  Note: This dictation was prepared with Dragon dictation along with smaller phrase technology. Any transcriptional errors that result from this process are unintentional.   Ramonita Lab M.D on 11/29/2015 at 5:41 PM  Between 7am to 6pm - Pager - [phone] After 6pm go to [email or web address] - password [email or web address] [facility]  [facility]  Office  [phone]  CC: Primary care physician; Barbette Reichmann, MD

## 2015-11-30 ENCOUNTER — Telehealth: Payer: Self-pay

## 2015-11-30 ENCOUNTER — Other Ambulatory Visit: Payer: Self-pay | Admitting: Obstetrics and Gynecology

## 2015-11-30 ENCOUNTER — Inpatient Hospital Stay: Payer: Medicare Other

## 2015-11-30 DIAGNOSIS — N390 Urinary tract infection, site not specified: Secondary | ICD-10-CM

## 2015-11-30 DIAGNOSIS — A419 Sepsis, unspecified organism: Secondary | ICD-10-CM

## 2015-11-30 DIAGNOSIS — R338 Other retention of urine: Secondary | ICD-10-CM

## 2015-11-30 LAB — CBC
HEMATOCRIT: 27.2 % — AB (ref 35.0–47.0)
HEMOGLOBIN: 9 g/dL — AB (ref 12.0–16.0)
MCH: 33.5 pg (ref 26.0–34.0)
MCHC: 33 g/dL (ref 32.0–36.0)
MCV: 101.6 fL — AB (ref 80.0–100.0)
Platelets: 429 10*3/uL (ref 150–440)
RBC: 2.68 MIL/uL — ABNORMAL LOW (ref 3.80–5.20)
RDW: 19.6 % — AB (ref 11.5–14.5)
WBC: 15.5 10*3/uL — AB (ref 3.6–11.0)

## 2015-11-30 LAB — WET PREP, GENITAL
Clue Cells Wet Prep HPF POC: NONE SEEN
Sperm: NONE SEEN
Trich, Wet Prep: NONE SEEN
Yeast Wet Prep HPF POC: NONE SEEN

## 2015-11-30 LAB — GLUCOSE, CAPILLARY
GLUCOSE-CAPILLARY: 193 mg/dL — AB (ref 65–99)
GLUCOSE-CAPILLARY: 129 mg/dL — AB (ref 65–99)
Glucose-Capillary: 90 mg/dL (ref 65–99)
Glucose-Capillary: 148 mg/dL — ABNORMAL HIGH (ref 65–99)

## 2015-11-30 LAB — TROPONIN I
TROPONIN I: 0.08 ng/mL — AB (ref <0.031)
TROPONIN I: 0.04 ng/mL — AB (ref <0.031)

## 2015-11-30 MED ORDER — NITROGLYCERIN 0.4 MG SL SUBL
0.4000 mg | SUBLINGUAL_TABLET | SUBLINGUAL | Status: DC | PRN
Start: 2015-11-30 — End: 2015-12-02
  Administered 2015-11-30 (×2): 0.4 mg via SUBLINGUAL

## 2015-11-30 MED ORDER — POTASSIUM CHLORIDE 20 MEQ PO PACK
40.0000 meq | PACK | Freq: Once | ORAL | Status: AC
  Administered 2015-11-30: 40 meq via ORAL
  Filled 2015-11-30: qty 2

## 2015-11-30 NOTE — Progress Notes (Signed)
PT Cancellation Note  Patient Details Name: Kelsey Chung MRN: [id number] DOB: [birth year]   Cancelled Treatment:    Reason Eval/Treat Not Completed: Medical issues which prohibited therapy. Pt's chart reviewed. She is admitted for recurrent UTI. Pt recently c/o B UE weakness and difficulty grasping objects. She has a stat CT order in place. Pt currently not appropriate to participate in therapy until further examination completed. PT will f/u at a later time and complete evaluation when appropriate.   Adelene Idler, PT, DPT  11/30/2015, 4:03 PM [phone]

## 2015-11-30 NOTE — Progress Notes (Signed)
Dtr came to desk and stated pt complaining of chest pain.  Assessed pt and pt with irregular heart beat.  Pt sleepy but able to state she has chest pain.  Pt at times clutching her chest. VSS.  Called Dr Joneen Roach and received orders for nitrostat, ekg, labs, and monitor.  Pt placed on monitor and given nitrostat x2.  Pt was able to state her pain went from 8/10 before the first tab, then 6/10 before the second tab tne 2/10.  Vital signs remained stable during this time.  EKG done and will call to Dr Joneen Roach once labs available. EKG appears same as admission EKG to this RN.  Pt resting without discomfort at this time.  Discussed with dtr code status and she states she wants everything done including ACLS.  Reassured dtr we would do everything possible to help her mother. Henriette Combs RN

## 2015-11-30 NOTE — Progress Notes (Signed)
Consult History and Physical   SERVICE: Gynecology at [facility]  Patient Name: Kelsey Chung Patient MRN:   [id number]  CC: Called for vaginal dc starting    With     Consult History and Physical   SERVICE: Gynecology at [facility]  Patient Name: Kelsey Chung Patient MRN:   [id number]  CC: Called for vaginal dc starting    With      HPI: Kelsey Chung is a 82 y.o. No obstetric history on file. With [facility].    Review of Systems: positives in bold GEN: no  fevers, chills, weight changes, appetite changes, fatigue, night sweats HEENT:  HA, vision changes, hearing loss, congestion, rhinorrhea, sinus pressure, dysphagia CV:   CP, palpitations PULM:  SOB, cough GI:  abd pain, N/V/D/C GU:  dysuria, urgency, frequency MSK:  arthralgias, myalgias, back pain, swelling SKIN:  rashes, color changes, pallor NEURO:  numbness, weakness, tingling, seizures, dizziness, tremors PSYCH:  depression, anxiety, behavioral problems, confusion  HEME/LYMPH:  easy bruising or bleeding ENDO:  heat/cold intolerance  Past Obstetrical History: OB History    No data available      Past Gynecologic History: No LMP recorded. Patient is not currently having periods (Reason: Other). Menstrual frequency absent due to post-menopausal. No episodes of vag bleeding.  Past Medical History: Past Medical History  Diagnosis Date  . Diabetes (HCC)   . Hypertension   . Rheumatoid arthritis(714.0)   . Hyperlipidemia   . Gout   . Osteoarthritis (arthritis due to wear and tear of joints)   . Scoliosis   . Constipation, chronic   . CKD (chronic kidney disease) stage 4, GFR 15-29 ml/min (HCC)   . New onset atrial fibrillation (HCC)     a. diagnosed in 11/2015 - admitted for sepsis  . TIA (transient ischemic attack)     a. multiple TIA's 20+ years ago according to patient's daughter    Past Surgical History:   Past Surgical History  Procedure Laterality Date  . Hernia repair    . Gallbladder  surgery    . Appendectomy    . Ovarian cyst surgery    . Foot surgery Right   . Eye surgery Bilateral   . Breast cyst excision Left   . Cardiac catheterization    . Colonoscopy    . Esophagogastroduodenoscopy endoscopy    . Dilation and curettage of uterus    . Esophageal manometry N/A 05/24/2015    Procedure: ESOPHAGEAL MANOMETRY (EM);  Surgeon: Elnita Maxwell, MD;  Location: [facility] ENDOSCOPY;  Service: Endoscopy;  Laterality: N/A;    Family History:  family history includes Breast cancer in her mother; CAD in her father.  Social History:  Social History   Social History  . Marital Status: Widowed    Spouse Name: N/A  . Number of Children: N/A  . Years of Education: N/A   Occupational History  . Not on file.   Social History Main Topics  . Smoking status: Never Smoker   . Smokeless tobacco: Never Used  . Alcohol Use: No  . Drug Use: No  . Sexual Activity: Not on file   Other Topics Concern  . Not on file   Social History Narrative    Home Medications:  Medications reconciled in EPIC  Current Facility-Administered Medications on File Prior to Visit  Medication Dose Route Frequency Provider Last Rate Last Dose  . acetaminophen (TYLENOL) tablet 650 mg  650 mg Oral Q4H PRN Ramonita Lab, MD      .  allopurinol (ZYLOPRIM) tablet 100 mg  100 mg Oral BID Altamese Dilling, MD   100 mg at 11/30/15 1051  . apixaban (ELIQUIS) tablet 2.5 mg  2.5 mg Oral BID Altamese Dilling, MD   2.5 mg at 11/30/15 1051  . aspirin EC tablet 81 mg  81 mg Oral Daily Altamese Dilling, MD   81 mg at 11/30/15 1051  . atorvastatin (LIPITOR) tablet 10 mg  10 mg Oral q1800 Altamese Dilling, MD   10 mg at 11/29/15 1737  . aztreonam (AZACTAM) 500 mg in dextrose 5 % 50 mL IVPB  500 mg Intravenous Q8H Altamese Dilling, MD   500 mg at 11/30/15 1445  . cholecalciferol (VITAMIN D) tablet 1,000 Units  1,000 Units Oral Daily Altamese Dilling, MD   1,000 Units at 11/30/15 1051  .  diltiazem (CARDIZEM CD) 24 hr capsule 240 mg  240 mg Oral Daily Altamese Dilling, MD   240 mg at 11/30/15 1051  . ferrous sulfate tablet 325 mg  325 mg Oral Q breakfast Altamese Dilling, MD   325 mg at 11/30/15 1051  . gabapentin (NEURONTIN) capsule 600 mg  600 mg Oral TID Altamese Dilling, MD   600 mg at 11/30/15 1618  . hydroxychloroquine (PLAQUENIL) tablet 200 mg  200 mg Oral Daily Altamese Dilling, MD   200 mg at 11/30/15 1051  . insulin aspart (novoLOG) injection 0-9 Units  0-9 Units Subcutaneous TID WC Altamese Dilling, MD   1 Units at 11/30/15 1247  . lactulose (CHRONULAC) 10 GM/15ML solution 20 g  20 g Oral BID Altamese Dilling, MD   20 g at 11/29/15 2136  . metoprolol tartrate (LOPRESSOR) tablet 12.5 mg  12.5 mg Oral BID Altamese Dilling, MD   12.5 mg at 11/30/15 1052  . nitroGLYCERIN (NITROSTAT) SL tablet 0.4 mg  0.4 mg Sublingual Q5 min PRN Debby Crosley, MD   0.4 mg at 11/30/15 0103  . omega-3 acid ethyl esters (LOVAZA) capsule 1 g  1 g Oral BID Altamese Dilling, MD   1 g at 11/30/15 1051  . pantoprazole (PROTONIX) EC tablet 40 mg  40 mg Oral Daily Altamese Dilling, MD   40 mg at 11/30/15 1051  . rOPINIRole (REQUIP) tablet 0.5 mg  0.5 mg Oral QHS Altamese Dilling, MD   0.5 mg at 11/29/15 2136  . traMADol (ULTRAM) tablet 50 mg  50 mg Oral Q8H PRN Altamese Dilling, MD   50 mg at 11/29/15 2136  . vitamin B-12 (CYANOCOBALAMIN) tablet 1,000 mcg  1,000 mcg Oral Daily Altamese Dilling, MD   1,000 mcg at 11/30/15 1051  . vitamin C (ASCORBIC ACID) tablet 500 mg  500 mg Oral Daily Altamese Dilling, MD   500 mg at 11/30/15 1051   Current Outpatient Prescriptions on File Prior to Visit  Medication Sig Dispense Refill  . allopurinol (ZYLOPRIM) 100 MG tablet Take 100 mg by mouth 2 (two) times daily.     Marland Kitchen apixaban (ELIQUIS) 2.5 MG TABS tablet Take 1 tablet (2.5 mg total) by mouth 2 (two) times daily. 60 tablet 1  . aspirin EC 81 MG  tablet Take 81 mg by mouth every evening.     . cholecalciferol (VITAMIN D) 1000 units tablet Take 1,000 Units by mouth daily.    Marland Kitchen diltiazem (CARDIZEM CD) 240 MG 24 hr capsule Take 1 capsule (240 mg total) by mouth daily. 30 capsule 0  . ferrous sulfate 325 (65 FE) MG tablet Take 325 mg by mouth daily with breakfast.    .  gabapentin (NEURONTIN) 600 MG tablet Take 600 mg by mouth 3 (three) times daily.    Marland Kitchen glipiZIDE (GLUCOTROL) 5 MG tablet Take 5 mg by mouth 2 (two) times daily before a meal.     . glucosamine-chondroitin 500-400 MG tablet Take 1 tablet by mouth daily.    . hydrochlorothiazide (HYDRODIURIL) 25 MG tablet Take 25 mg by mouth daily.     . hydroxychloroquine (PLAQUENIL) 200 MG tablet Take 200 mg by mouth daily.    Marland Kitchen lactulose (CHRONULAC) 10 GM/15ML solution Take 20 g by mouth 2 (two) times daily.     . metoprolol tartrate (LOPRESSOR) 25 MG tablet Take 0.5 tablets (12.5 mg total) by mouth 2 (two) times daily. 60 tablet 0  . mupirocin cream (BACTROBAN) 2 % Apply 1 application topically at bedtime as needed (for ulcers on feet).     Marland Kitchen omega-3 acid ethyl esters (LOVAZA) 1 g capsule Take 1 g by mouth 2 (two) times daily.    . pantoprazole (PROTONIX) 40 MG tablet Take 40 mg by mouth daily.     Marland Kitchen rOPINIRole (REQUIP) 0.5 MG tablet Take 0.5 mg by mouth at bedtime.    . sulfamethoxazole-trimethoprim (BACTRIM DS,SEPTRA DS) 800-160 MG tablet Take 1 tablet by mouth daily. 14 tablet 0  . traMADol (ULTRAM) 50 MG tablet Take 50 mg by mouth every 8 (eight) hours as needed for moderate pain.     . Turmeric 500 MG TABS Take 500 mg by mouth daily.    . vitamin B-12 (CYANOCOBALAMIN) 1000 MCG tablet Take 1,000 mcg by mouth daily.    . vitamin C (ASCORBIC ACID) 500 MG tablet Take 500 mg by mouth daily.      Allergies:  Allergies  Allergen Reactions  . Augmentin [Amoxicillin-Pot Clavulanate] Anaphylaxis and Other (See Comments)    Has patient had a PCN reaction causing immediate rash,  facial/tongue/throat swelling, SOB or lightheadedness with hypotension: Yes Has patient had a PCN reaction causing severe rash involving mucus membranes or skin necrosis: No Has patient had a PCN reaction that required hospitalization No Has patient had a PCN reaction occurring within the last 10 years: Yes If all of the above answers are "NO", then may proceed with Cephalosporin use.  . Beta Adrenergic Blockers Other (See Comments)    Reaction:  Unknown  . Clindamycin Other (See Comments)    Reaction:  Redness to face/swollen eyes   . Excedrin Extra Strength [Aspirin-Acetaminophen-Caffeine] Diarrhea  . Keflex [Cephalexin] Swelling and Other (See Comments)    Reaction:  Lip swelling   . Meloxicam Other (See Comments)    Reaction:  Decreased renal function  . Methotrexate Sodium Other (See Comments)    Reaction:  Decreased renal function  . Nsaids Other (See Comments)    Reaction:  Unknown   . Quinine Rash  . Quinolones Rash    Physical Exam:  BP: 145/59-170/59 P:83 R: 20 Pulse ox: 94-100   General Appearance:  Well developed, well nourished, no acute distress, alert and oriented x3 HEENT:  Normocephalic atraumatic, moist mucous membranes Skin:  normal coloration and turgor, no rashes, warm and dry Pelvic:  NEFG, no vulvar masses or lesions, decreased  vaginal mucosa, no vaginal bleeding, scant mucoid dc at the introital opening, Unable to visualize the cx as no exam room used and placed on upside inverted bedpan to insert speculum, No uterine masses palp, no adnexal masses appreciated,   no pelvic organ prolapse,    Labs/Studies:   CBC and Coags:  Lab  Results  Component Value Date   WBC 15.5* 11/30/2015   NEUTOPHILPCT 80 11/21/2015   EOSPCT 1 11/21/2015   BASOPCT 1 11/21/2015   LYMPHOPCT 7 11/21/2015   HGB 9.0* 11/30/2015   HCT 27.2* 11/30/2015   MCV 101.6* 11/30/2015   PLT 429 11/30/2015   INR 1.03 11/21/2015   CMP:  Lab Results  Component Value Date   NA 139  11/29/2015   K 3.4* 11/29/2015   CL 111 11/29/2015   CO2 21* 11/29/2015   BUN 50* 11/29/2015   CREATININE 2.90* 11/29/2015   CREATININE 3.28* 11/28/2015   CREATININE 1.84* 11/26/2015   PROT 6.1* 11/28/2015   BILITOT 0.6 11/28/2015   BILIDIR 0.2 11/28/2015   ALT 24 11/28/2015   AST 26 11/28/2015   ALKPHOS 93 11/28/2015   Other Labs: Wet Prep: neg Clue, Neg Trich, neg Hyphae  Other Imaging: Dg Chest 1 View  11/28/2015  CLINICAL DATA:  Chest pain today. EXAM: CHEST 1 VIEW COMPARISON:  11/22/2015 FINDINGS: The heart is enlarged but stable. There is tortuosity and calcification of the thoracic aorta. The lungs demonstrate mild chronic bronchitic changes and streaky atelectasis but no definite infiltrates or effusions. The bony thorax is intact. IMPRESSION: 1. Chronic bronchitic changes and streaky areas of subsegmental atelectasis but no definite infiltrates or effusions. 2. Stable mild cardiac enlargement. 3. Stable aortic atherosclerosis. Electronically Signed   By: Rudie Meyer M.D.   On: 11/28/2015 15:02   Dg Chest 1 View  11/22/2015  CLINICAL DATA:  Acute onset of generalized weakness. Code sepsis. Initial encounter. EXAM: CHEST 1 VIEW COMPARISON:  Chest radiograph performed 11/21/2015 FINDINGS: The lungs are well-aerated. Mild bibasilar airspace opacities may reflect mild pneumonia. There is no evidence of pleural effusion or pneumothorax. The cardiomediastinal silhouette is enlarged. No acute osseous abnormalities are seen. IMPRESSION: 1. Mild bibasilar opacities may reflect mild pneumonia. 2. Cardiomegaly. Electronically Signed   By: Roanna Raider M.D.   On: 11/22/2015 18:32   Ct Head Wo Contrast  11/30/2015  CLINICAL DATA:  Bilateral upper extremity weakness right worse than left. EXAM: CT HEAD WITHOUT CONTRAST TECHNIQUE: Contiguous axial images were obtained from the base of the skull through the vertex without intravenous contrast. COMPARISON:  07/25/2006 FINDINGS: The ventricles,  cisterns and other CSF spaces are within normal. There is mild-to-moderate chronic ischemic microvascular disease. No evidence of mass, mass effect, shift of midline structures or acute hemorrhage. No evidence of acute infarction. Remaining bones and soft tissues are within normal. IMPRESSION: No acute intracranial findings. Chronic ischemic microvascular disease. Electronically Signed   By: Elberta Fortis M.D.   On: 11/30/2015 16:52   Dg Chest Port 1 View  11/21/2015  CLINICAL DATA:  Family states pt ate some ice cream tonight then went to the bathroom and became weak with uncontrolled shaking. New onset of Afib. History of HTN, stroke, diabetic EXAM: PORTABLE CHEST 1 VIEW COMPARISON:  01/11/2014 FINDINGS: Cardiac silhouette is mildly enlarged. No mediastinal or hilar masses or evidence of adenopathy. Clear lungs. No pleural effusion or pneumothorax. The bony thorax is demineralized but grossly intact. IMPRESSION: No acute cardiopulmonary disease. Electronically Signed   By: Amie Portland M.D.   On: 11/21/2015 20:05   Ct Renal Stone Study  11/28/2015  CLINICAL DATA:  Worsening weakness and lethargy status post treated urinary sepsis. Abdominal pain. Difficulty urinating. EXAM: CT ABDOMEN AND PELVIS WITHOUT CONTRAST TECHNIQUE: Multidetector CT imaging of the abdomen and pelvis was performed following the standard protocol without IV contrast. COMPARISON:  Renal ultrasound 01/12/2014 FINDINGS: Lower chest: Evaluation of lung parenchyma is precluded by breathing motion artifact. There are bilateral small pleural effusions with associated compressive bibasilar atelectasis. Mitral and aortic valve annular calcifications. Hepatobiliary: No mass visualized on this un-enhanced exam. Postcholecystectomy. Pancreas: No mass or inflammatory process identified on this un-enhanced exam. Spleen: Within normal limits in size. Adrenals/Urinary Tract: No evidence of urolithiasis or hydronephrosis. No definite mass visualized on  this un-enhanced exam. Mildly atrophic kidneys. Stomach/Bowel: No evidence of obstruction, inflammatory process, or abnormal fluid collections. Vascular/Lymphatic: No pathologically enlarged lymph nodes. No evidence of abdominal aortic aneurysm. Mild diffuse mesenteric stranding. Advanced calcific atherosclerotic disease of the aorta and its main attributes. Reproductive: No mass or other significant abnormality. The urinary bladder is not well evaluated as it is decompressed around urinary Foley. Other: Chest wall edema. Musculoskeletal: Reversed S shaped thoraco lumbar spine scoliosis with advanced osteoarthritic changes. No suspicious osseous lesions. IMPRESSION: Bilateral pleural effusions with bibasilar hypoventilatory changes. No evidence of acute abnormality within the solid abdominal organs. Somewhat atrophic kidneys. Diffuse mesenteric stranding and chest and abdominal wall edema, likely due to third spacing. Advanced calcific atherosclerotic disease of the aorta. Mitral and aortic valve annular calcifications. Electronically Signed   By: Ted Mcalpine M.D.   On: 11/28/2015 16:04     Assessment / Plan:   Kelsey Chung is a 82 y.o. No obstetric history on file. who presents with vag dc today and consult placed for [facility] to do a pelvic and evaluate pt.   1. New onset of At Fib 2. Vaginal dc neg for BV, Trich, yeast   Thank you for the opportunity to be involved with this pt's care.  Sharee Pimple, MSN, CNM, FNP  HPI: Kelsey Chung is a 82 y.o. No obstetric history on file. With [facility].    Review of Systems: positives in bold GEN: no  fevers, chills, weight changes, appetite changes, fatigue, night sweats HEENT:  HA, vision changes, hearing loss, congestion, rhinorrhea, sinus pressure, dysphagia CV:   CP, palpitations PULM:  SOB, cough GI:  abd pain, N/V/D/C GU:  dysuria, urgency, frequency MSK:  arthralgias, myalgias, back pain, swelling SKIN:  rashes, color changes,  pallor NEURO:  numbness, weakness, tingling, seizures, dizziness, tremors PSYCH:  depression, anxiety, behavioral problems, confusion  HEME/LYMPH:  easy bruising or bleeding ENDO:  heat/cold intolerance  Past Obstetrical History: OB History    No data available      Past Gynecologic History: No LMP recorded. Patient is not currently having periods (Reason: Other). Menstrual frequency absent due to post-menopausal. No episodes of vag bleeding.  Past Medical History: Past Medical History  Diagnosis Date  . Diabetes (HCC)   . Hypertension   . Rheumatoid arthritis(714.0)   . Hyperlipidemia   . Gout   . Osteoarthritis (arthritis due to wear and tear of joints)   . Scoliosis   . Constipation, chronic   . CKD (chronic kidney disease) stage 4, GFR 15-29 ml/min (HCC)   . New onset atrial fibrillation (HCC)     a. diagnosed in 11/2015 - admitted for sepsis  . TIA (transient ischemic attack)     a. multiple TIA's 20+ years ago according to patient's daughter    Past Surgical History:   Past Surgical History  Procedure Laterality Date  . Hernia repair    . Gallbladder surgery    . Appendectomy    . Ovarian cyst surgery    . Foot surgery Right   .  Eye surgery Bilateral   . Breast cyst excision Left   . Cardiac catheterization    . Colonoscopy    . Esophagogastroduodenoscopy endoscopy    . Dilation and curettage of uterus    . Esophageal manometry N/A 05/24/2015    Procedure: ESOPHAGEAL MANOMETRY (EM);  Surgeon: Elnita Maxwell, MD;  Location: [facility] ENDOSCOPY;  Service: Endoscopy;  Laterality: N/A;    Family History:  family history includes Breast cancer in her mother; CAD in her father.  Social History:  Social History   Social History  . Marital Status: Widowed    Spouse Name: N/A  . Number of Children: N/A  . Years of Education: N/A   Occupational History  . Not on file.   Social History Main Topics  . Smoking status: Never Smoker   . Smokeless tobacco:  Never Used  . Alcohol Use: No  . Drug Use: No  . Sexual Activity: Not on file   Other Topics Concern  . Not on file   Social History Narrative    Home Medications:  Medications reconciled in EPIC  No current facility-administered medications on file prior to encounter.   Current Outpatient Prescriptions on File Prior to Encounter  Medication Sig Dispense Refill  . allopurinol (ZYLOPRIM) 100 MG tablet Take 100 mg by mouth 2 (two) times daily.     Marland Kitchen apixaban (ELIQUIS) 2.5 MG TABS tablet Take 1 tablet (2.5 mg total) by mouth 2 (two) times daily. 60 tablet 1  . aspirin EC 81 MG tablet Take 81 mg by mouth every evening.     . cholecalciferol (VITAMIN D) 1000 units tablet Take 1,000 Units by mouth daily.    Marland Kitchen diltiazem (CARDIZEM CD) 240 MG 24 hr capsule Take 1 capsule (240 mg total) by mouth daily. 30 capsule 0  . ferrous sulfate 325 (65 FE) MG tablet Take 325 mg by mouth daily with breakfast.    . gabapentin (NEURONTIN) 600 MG tablet Take 600 mg by mouth 3 (three) times daily.    Marland Kitchen glipiZIDE (GLUCOTROL) 5 MG tablet Take 5 mg by mouth 2 (two) times daily before a meal.     . glucosamine-chondroitin 500-400 MG tablet Take 1 tablet by mouth daily.    . hydrochlorothiazide (HYDRODIURIL) 25 MG tablet Take 25 mg by mouth daily.     . hydroxychloroquine (PLAQUENIL) 200 MG tablet Take 200 mg by mouth daily.    Marland Kitchen lactulose (CHRONULAC) 10 GM/15ML solution Take 20 g by mouth 2 (two) times daily.     . metoprolol tartrate (LOPRESSOR) 25 MG tablet Take 0.5 tablets (12.5 mg total) by mouth 2 (two) times daily. 60 tablet 0  . mupirocin cream (BACTROBAN) 2 % Apply 1 application topically at bedtime as needed (for ulcers on feet).     Marland Kitchen omega-3 acid ethyl esters (LOVAZA) 1 g capsule Take 1 g by mouth 2 (two) times daily.    . pantoprazole (PROTONIX) 40 MG tablet Take 40 mg by mouth daily.     Marland Kitchen rOPINIRole (REQUIP) 0.5 MG tablet Take 0.5 mg by mouth at bedtime.    . sulfamethoxazole-trimethoprim (BACTRIM  DS,SEPTRA DS) 800-160 MG tablet Take 1 tablet by mouth daily. 14 tablet 0  . traMADol (ULTRAM) 50 MG tablet Take 50 mg by mouth every 8 (eight) hours as needed for moderate pain.     . Turmeric 500 MG TABS Take 500 mg by mouth daily.    . vitamin B-12 (CYANOCOBALAMIN) 1000 MCG tablet Take 1,000 mcg by  mouth daily.    . vitamin C (ASCORBIC ACID) 500 MG tablet Take 500 mg by mouth daily.      Allergies:  Allergies  Allergen Reactions  . Augmentin [Amoxicillin-Pot Clavulanate] Anaphylaxis and Other (See Comments)    Has patient had a PCN reaction causing immediate rash, facial/tongue/throat swelling, SOB or lightheadedness with hypotension: Yes Has patient had a PCN reaction causing severe rash involving mucus membranes or skin necrosis: No Has patient had a PCN reaction that required hospitalization No Has patient had a PCN reaction occurring within the last 10 years: Yes If all of the above answers are "NO", then may proceed with Cephalosporin use.  . Beta Adrenergic Blockers Other (See Comments)    Reaction:  Unknown  . Clindamycin Other (See Comments)    Reaction:  Redness to face/swollen eyes   . Excedrin Extra Strength [Aspirin-Acetaminophen-Caffeine] Diarrhea  . Keflex [Cephalexin] Swelling and Other (See Comments)    Reaction:  Lip swelling   . Meloxicam Other (See Comments)    Reaction:  Decreased renal function  . Methotrexate Sodium Other (See Comments)    Reaction:  Decreased renal function  . Nsaids Other (See Comments)    Reaction:  Unknown   . Quinine Rash  . Quinolones Rash    Physical Exam:  BP: 145/59-170/59 P:83 R: 20 Pulse ox: 94-100   General Appearance:  Well developed, well nourished, no acute distress, alert and oriented x3 HEENT:  Normocephalic atraumatic, moist mucous membranes Skin:  normal coloration and turgor, no rashes, warm and dry Pelvic:  NEFG, no vulvar masses or lesions, decreased  vaginal mucosa, no vaginal bleeding, scant mucoid dc at the  introital opening, Unable to visualize the cx as no exam room used and placed on upside inverted bedpan to insert speculum, No uterine masses palp, no adnexal masses appreciated,   no pelvic organ prolapse,    Labs/Studies:   CBC and Coags:  Lab Results  Component Value Date   WBC 15.5* 11/30/2015   NEUTOPHILPCT 80 11/21/2015   EOSPCT 1 11/21/2015   BASOPCT 1 11/21/2015   LYMPHOPCT 7 11/21/2015   HGB 9.0* 11/30/2015   HCT 27.2* 11/30/2015   MCV 101.6* 11/30/2015   PLT 429 11/30/2015   INR 1.03 11/21/2015   CMP:  Lab Results  Component Value Date   NA 139 11/29/2015   K 3.4* 11/29/2015   CL 111 11/29/2015   CO2 21* 11/29/2015   BUN 50* 11/29/2015   CREATININE 2.90* 11/29/2015   CREATININE 3.28* 11/28/2015   CREATININE 1.84* 11/26/2015   PROT 6.1* 11/28/2015   BILITOT 0.6 11/28/2015   BILIDIR 0.2 11/28/2015   ALT 24 11/28/2015   AST 26 11/28/2015   ALKPHOS 93 11/28/2015   Other Labs: Wet Prep: neg Clue, Neg Trich, neg Hyphae  Other Imaging: Dg Chest 1 View  11/28/2015  CLINICAL DATA:  Chest pain today. EXAM: CHEST 1 VIEW COMPARISON:  11/22/2015 FINDINGS: The heart is enlarged but stable. There is tortuosity and calcification of the thoracic aorta. The lungs demonstrate mild chronic bronchitic changes and streaky atelectasis but no definite infiltrates or effusions. The bony thorax is intact. IMPRESSION: 1. Chronic bronchitic changes and streaky areas of subsegmental atelectasis but no definite infiltrates or effusions. 2. Stable mild cardiac enlargement. 3. Stable aortic atherosclerosis. Electronically Signed   By: Rudie Meyer M.D.   On: 11/28/2015 15:02   Dg Chest 1 View  11/22/2015  CLINICAL DATA:  Acute onset of generalized weakness. Code sepsis. Initial encounter.  EXAM: CHEST 1 VIEW COMPARISON:  Chest radiograph performed 11/21/2015 FINDINGS: The lungs are well-aerated. Mild bibasilar airspace opacities may reflect mild pneumonia. There is no evidence of pleural  effusion or pneumothorax. The cardiomediastinal silhouette is enlarged. No acute osseous abnormalities are seen. IMPRESSION: 1. Mild bibasilar opacities may reflect mild pneumonia. 2. Cardiomegaly. Electronically Signed   By: Roanna Raider M.D.   On: 11/22/2015 18:32   Ct Head Wo Contrast  11/30/2015  CLINICAL DATA:  Bilateral upper extremity weakness right worse than left. EXAM: CT HEAD WITHOUT CONTRAST TECHNIQUE: Contiguous axial images were obtained from the base of the skull through the vertex without intravenous contrast. COMPARISON:  07/25/2006 FINDINGS: The ventricles, cisterns and other CSF spaces are within normal. There is mild-to-moderate chronic ischemic microvascular disease. No evidence of mass, mass effect, shift of midline structures or acute hemorrhage. No evidence of acute infarction. Remaining bones and soft tissues are within normal. IMPRESSION: No acute intracranial findings. Chronic ischemic microvascular disease. Electronically Signed   By: Elberta Fortis M.D.   On: 11/30/2015 16:52   Dg Chest Port 1 View  11/21/2015  CLINICAL DATA:  Family states pt ate some ice cream tonight then went to the bathroom and became weak with uncontrolled shaking. New onset of Afib. History of HTN, stroke, diabetic EXAM: PORTABLE CHEST 1 VIEW COMPARISON:  01/11/2014 FINDINGS: Cardiac silhouette is mildly enlarged. No mediastinal or hilar masses or evidence of adenopathy. Clear lungs. No pleural effusion or pneumothorax. The bony thorax is demineralized but grossly intact. IMPRESSION: No acute cardiopulmonary disease. Electronically Signed   By: Amie Portland M.D.   On: 11/21/2015 20:05   Ct Renal Stone Study  11/28/2015  CLINICAL DATA:  Worsening weakness and lethargy status post treated urinary sepsis. Abdominal pain. Difficulty urinating. EXAM: CT ABDOMEN AND PELVIS WITHOUT CONTRAST TECHNIQUE: Multidetector CT imaging of the abdomen and pelvis was performed following the standard protocol without IV  contrast. COMPARISON:  Renal ultrasound 01/12/2014 FINDINGS: Lower chest: Evaluation of lung parenchyma is precluded by breathing motion artifact. There are bilateral small pleural effusions with associated compressive bibasilar atelectasis. Mitral and aortic valve annular calcifications. Hepatobiliary: No mass visualized on this un-enhanced exam. Postcholecystectomy. Pancreas: No mass or inflammatory process identified on this un-enhanced exam. Spleen: Within normal limits in size. Adrenals/Urinary Tract: No evidence of urolithiasis or hydronephrosis. No definite mass visualized on this un-enhanced exam. Mildly atrophic kidneys. Stomach/Bowel: No evidence of obstruction, inflammatory process, or abnormal fluid collections. Vascular/Lymphatic: No pathologically enlarged lymph nodes. No evidence of abdominal aortic aneurysm. Mild diffuse mesenteric stranding. Advanced calcific atherosclerotic disease of the aorta and its main attributes. Reproductive: No mass or other significant abnormality. The urinary bladder is not well evaluated as it is decompressed around urinary Foley. Other: Chest wall edema. Musculoskeletal: Reversed S shaped thoraco lumbar spine scoliosis with advanced osteoarthritic changes. No suspicious osseous lesions. IMPRESSION: Bilateral pleural effusions with bibasilar hypoventilatory changes. No evidence of acute abnormality within the solid abdominal organs. Somewhat atrophic kidneys. Diffuse mesenteric stranding and chest and abdominal wall edema, likely due to third spacing. Advanced calcific atherosclerotic disease of the aorta. Mitral and aortic valve annular calcifications. Electronically Signed   By: Ted Mcalpine M.D.   On: 11/28/2015 16:04     Assessment / Plan:   Kelsey Chung is a 82 y.o. No obstetric history on file. who presents with vag dc today and consult placed for [facility] to do a pelvic and evaluate pt.   1. New onset of At Fib  2. Vaginal dc neg for BV, Trich,  yeast   Thank you for the opportunity to be involved with this pt's care.  Sharee Pimple, MSN, CNM, FNP

## 2015-11-30 NOTE — Consult Note (Signed)
@ENCDATE @ 12:36 PM   Kelsey Chung [birth year]  Referring provider: Dr. [id number]  Chief Complaint  Patient presents with  . Urinary Frequency    HPI: The patient is an 82 year old female presented to Hospital with urinary tract infection and urinary retention of 500 cc. In discussion with the patient and her daughter, she has not experienced urinary retention the past.  Her urine and catheter placement was apparently white and thick per the daughter. She has been recently treated for urinary tract infection however in review of her cultures from the last 6 months there is not one positive culture. Her urinalysis at this time is concerning for infection. The culture is pending. CT shows no hydronephrosis or stones.   PMH: Past Medical History  Diagnosis Date  . Diabetes (HCC)   . Hypertension   . Rheumatoid arthritis(714.0)   . Hyperlipidemia   . Gout   . Osteoarthritis (arthritis due to wear and tear of joints)   . Scoliosis   . Constipation, chronic   . CKD (chronic kidney disease) stage 4, GFR 15-29 ml/min (HCC)   . New onset atrial fibrillation (HCC)     a. diagnosed in 11/2015 - admitted for sepsis  . TIA (transient ischemic attack)     a. multiple TIA's 20+ years ago according to patient's daughter    Surgical History: Past Surgical History  Procedure Laterality Date  . Hernia repair    . Gallbladder surgery    . Appendectomy    . Ovarian cyst surgery    . Foot surgery Right   . Eye surgery Bilateral   . Breast cyst excision Left   . Cardiac catheterization    . Colonoscopy    . Esophagogastroduodenoscopy endoscopy    . Dilation and curettage of uterus    . Esophageal manometry N/A 05/24/2015    Procedure: ESOPHAGEAL MANOMETRY (EM);  Surgeon: 05/26/2015, MD;  Location[facility] ENDOSCOPY;  Service: Endoscopy;  Laterality: N/A;    Home Medications:    Medication List    ASK your doctor about these medications        allopurinol 100 MG tablet  Commonly known as:  ZYLOPRIM  Take 100 mg by mouth 2 (two) times daily.     apixaban 2.5 MG Tabs tablet  Commonly known as:  ELIQUIS  Take 1 tablet (2.5 mg total) by mouth 2 (two) times daily.     aspirin EC 81 MG tablet  Take 81 mg by mouth every evening.     cholecalciferol 1000 units tablet  Commonly known as:  VITAMIN D  Take 1,000 Units by mouth daily.     diltiazem 240 MG 24 hr capsule  Commonly known as:  CARDIZEM CD  Take 1 capsule (240 mg total) by mouth daily.     ferrous sulfate 325 (65 FE) MG tablet  Take 325 mg by mouth daily with breakfast.     gabapentin 600 MG tablet  Commonly known as:  NEURONTIN  Take 600 mg by mouth 3 (three) times daily.     glipiZIDE 5 MG tablet  Commonly known as:  GLUCOTROL  Take 5 mg by mouth 2 (two) times daily before a meal.     glucosamine-chondroitin 500-400 MG tablet  Take 1 tablet by mouth daily.     hydrochlorothiazide 25 MG tablet  Commonly known as:  HYDRODIURIL  Take 25 mg by mouth daily.     hydroxychloroquine 200 MG tablet  Commonly known as:  PLAQUENIL  Take 200 mg by mouth daily.     lactulose 10 GM/15ML solution  Commonly known as:  CHRONULAC  Take 20 g by mouth 2 (two) times daily.     metoprolol tartrate 25 MG tablet  Commonly known as:  LOPRESSOR  Take 0.5 tablets (12.5 mg total) by mouth 2 (two) times daily.     mupirocin cream 2 %  Commonly known as:  BACTROBAN  Apply 1 application topically at bedtime as needed (for ulcers on feet).     omega-3 acid ethyl esters 1 g capsule  Commonly known as:  LOVAZA  Take 1 g by mouth 2 (two) times daily.     pantoprazole 40 MG tablet  Commonly known as:  PROTONIX  Take 40 mg by mouth daily.     rOPINIRole 0.5 MG tablet  Commonly known as:  REQUIP  Take 0.5 mg by mouth at bedtime.     sulfamethoxazole-trimethoprim 800-160 MG tablet  Commonly known as:  BACTRIM DS,SEPTRA DS  Take 1 tablet by mouth daily.     traMADol 50 MG  tablet  Commonly known as:  ULTRAM  Take 50 mg by mouth every 8 (eight) hours as needed for moderate pain.     Turmeric 500 MG Tabs  Take 500 mg by mouth daily.     vitamin B-12 1000 MCG tablet  Commonly known as:  CYANOCOBALAMIN  Take 1,000 mcg by mouth daily.     vitamin C 500 MG tablet  Commonly known as:  ASCORBIC ACID  Take 500 mg by mouth daily.        Allergies:  Allergies  Allergen Reactions  . Augmentin [Amoxicillin-Pot Clavulanate] Anaphylaxis and Other (See Comments)    Has patient had a PCN reaction causing immediate rash, facial/tongue/throat swelling, SOB or lightheadedness with hypotension: Yes Has patient had a PCN reaction causing severe rash involving mucus membranes or skin necrosis: No Has patient had a PCN reaction that required hospitalization No Has patient had a PCN reaction occurring within the last 10 years: Yes If all of the above answers are "NO", then may proceed with Cephalosporin use.  . Beta Adrenergic Blockers Other (See Comments)    Reaction:  Unknown  . Clindamycin Other (See Comments)    Reaction:  Redness to face/swollen eyes   . Excedrin Extra Strength [Aspirin-Acetaminophen-Caffeine] Diarrhea  . Keflex [Cephalexin] Swelling and Other (See Comments)    Reaction:  Lip swelling   . Meloxicam Other (See Comments)    Reaction:  Decreased renal function  . Methotrexate Sodium Other (See Comments)    Reaction:  Decreased renal function  . Nsaids Other (See Comments)    Reaction:  Unknown   . Quinine Rash  . Quinolones Rash    Family History: Family History  Problem Relation Age of Onset  . Breast cancer Mother   . CAD Father     Social History:  reports that she has never smoked. She has never used smokeless tobacco. She reports that she does not drink alcohol or use illicit drugs.  ROS: 12 point ROS otherwise negative                                        Physical Exam: BP 145/69 mmHg  Pulse 68   Temp(Src) 98 F (36.7 C) (Oral)  Resp 17  Ht 4\' 11"  (1.499 m)  Wt 176 lb (79.833  kg)  BMI 35.53 kg/m2  SpO2 100%  Constitutional:  Alert and oriented, No acute distress. HEENT: [place] AT, moist mucus membranes.  Trachea midline, no masses. Cardiovascular: No clubbing, cyanosis, or edema. Respiratory: Normal respiratory effort, no increased work of breathing. GI: Abdomen is soft, nontender, nondistended, no abdominal masses GU: No CVA tenderness. Foley clear yellow. Skin: No rashes, bruises or suspicious lesions. Lymph: No cervical or inguinal adenopathy. Neurologic: Grossly intact, no focal deficits, moving all 4 extremities. Psychiatric: Normal mood and affect.  Laboratory Data: Lab Results  Component Value Date   WBC 15.5* 11/30/2015   HGB 9.0* 11/30/2015   HCT 27.2* 11/30/2015   MCV 101.6* 11/30/2015   PLT 429 11/30/2015    Lab Results  Component Value Date   CREATININE 2.90* 11/29/2015    No results found for: PSA  No results found for: TESTOSTERONE  No results found for: HGBA1C  Urinalysis    Component Value Date/Time   COLORURINE YELLOW* 11/28/2015 1423   COLORURINE Amber 01/11/2014 1544   APPEARANCEUR TURBID* 11/28/2015 1423   APPEARANCEUR Cloudy 01/11/2014 1544   LABSPEC 1.020 11/28/2015 1423   LABSPEC 1.015 01/11/2014 1544   PHURINE 5.0 11/28/2015 1423   PHURINE 5.0 01/11/2014 1544   GLUCOSEU NEGATIVE 11/28/2015 1423   GLUCOSEU Negative 01/11/2014 1544   HGBUR NEGATIVE 11/28/2015 1423   BILIRUBINUR NEGATIVE 11/28/2015 1423   KETONESUR NEGATIVE 11/28/2015 1423   KETONESUR Trace 01/11/2014 1544   PROTEINUR 30* 11/28/2015 1423   PROTEINUR Negative 01/11/2014 1544   NITRITE NEGATIVE 11/28/2015 1423   NITRITE Negative 01/11/2014 1544   LEUKOCYTESUR NEGATIVE 11/28/2015 1423   LEUKOCYTESUR Negative 01/11/2014 1544    Pertinent Imaging:  CLINICAL DATA: Worsening weakness and lethargy status post treated urinary sepsis. Abdominal pain. Difficulty  urinating.  EXAM: CT ABDOMEN AND PELVIS WITHOUT CONTRAST  TECHNIQUE: Multidetector CT imaging of the abdomen and pelvis was performed following the standard protocol without IV contrast.  COMPARISON: Renal ultrasound 01/12/2014  FINDINGS: Lower chest: Evaluation of lung parenchyma is precluded by breathing motion artifact. There are bilateral small pleural effusions with associated compressive bibasilar atelectasis. Mitral and aortic valve annular calcifications.  Hepatobiliary: No mass visualized on this un-enhanced exam. Postcholecystectomy.  Pancreas: No mass or inflammatory process identified on this un-enhanced exam.  Spleen: Within normal limits in size.  Adrenals/Urinary Tract: No evidence of urolithiasis or hydronephrosis. No definite mass visualized on this un-enhanced exam. Mildly atrophic kidneys.  Stomach/Bowel: No evidence of obstruction, inflammatory process, or abnormal fluid collections.  Vascular/Lymphatic: No pathologically enlarged lymph nodes. No evidence of abdominal aortic aneurysm. Mild diffuse mesenteric stranding. Advanced calcific atherosclerotic disease of the aorta and its main attributes.  Reproductive: No mass or other significant abnormality. The urinary bladder is not well evaluated as it is decompressed around urinary Foley.  Other: Chest wall edema.  Musculoskeletal: Reversed S shaped thoraco lumbar spine scoliosis with advanced osteoarthritic changes. No suspicious osseous lesions.  IMPRESSION: Bilateral pleural effusions with bibasilar hypoventilatory changes.  No evidence of acute abnormality within the solid abdominal organs.  Somewhat atrophic kidneys.  Diffuse mesenteric stranding and chest and abdominal wall edema, likely due to third spacing.  Advanced calcific atherosclerotic disease of the aorta. Mitral and aortic valve annular calcifications.      Assessment & Plan:    1. Acute urinary  retention 2. UTI -no acute urologic intervention indicated -continue foley -continue abx pending c/s results -follow up as outpatient for trial of void -urology will sign off  Hildred Laser,  MD  [place] [address] [place], [place] [address] [phone]

## 2015-11-30 NOTE — Consult Note (Addendum)
[facility] Infectious Disease     Reason for Consult:UTI    Referring Physician:Gouru, A Date of Admission:  11/28/2015   Active Problems:   UTI (lower urinary tract infection)   UTI (urinary tract infection)   HPI: Kelsey Chung is a 82 y.o. female admitted 6/20 with urinary frequency, poor po intake, abd pain, vaginal iritaion. In ED cath placed and had 500 cc dirty urine.  Recent admit and dcd 6/18 on bactrim following several days of aztreonam. Cx negative. Cx also neg 6/13.   Past Medical History  Diagnosis Date  . Diabetes ([place])   . Hypertension   . Rheumatoid arthritis(714.0)   . Hyperlipidemia   . Gout   . Osteoarthritis (arthritis due to wear and tear of joints)   . Scoliosis   . Constipation, chronic   . CKD (chronic kidney disease) stage 4, GFR 15-29 ml/min (HCC)   . New onset atrial fibrillation ([place])     a. diagnosed in 11/2015 - admitted for sepsis  . TIA (transient ischemic attack)     a. multiple TIA's 20+ years ago according to patient's daughter   Past Surgical History  Procedure Laterality Date  . Hernia repair    . Gallbladder surgery    . Appendectomy    . Ovarian cyst surgery    . Foot surgery Right   . Eye surgery Bilateral   . Breast cyst excision Left   . Cardiac catheterization    . Colonoscopy    . Esophagogastroduodenoscopy endoscopy    . Dilation and curettage of uterus    . Esophageal manometry N/A 05/24/2015    Procedure: ESOPHAGEAL MANOMETRY (EM);  Surgeon: Josefine Class, MD;  Location: [facility] ENDOSCOPY;  Service: Endoscopy;  Laterality: N/A;   Social History  Substance Use Topics  . Smoking status: Never Smoker   . Smokeless tobacco: Never Used  . Alcohol Use: No   Family History  Problem Relation Age of Onset  . Breast cancer Mother   . CAD Father     Allergies:  Allergies  Allergen Reactions  . Augmentin [Amoxicillin-Pot Clavulanate] Anaphylaxis and Other (See Comments)    Has patient had a PCN reaction  causing immediate rash, facial/tongue/throat swelling, SOB or lightheadedness with hypotension: Yes Has patient had a PCN reaction causing severe rash involving mucus membranes or skin necrosis: No Has patient had a PCN reaction that required hospitalization No Has patient had a PCN reaction occurring within the last 10 years: Yes If all of the above answers are "NO", then may proceed with Cephalosporin use.  . Beta Adrenergic Blockers Other (See Comments)    Reaction:  Unknown  . Clindamycin Other (See Comments)    Reaction:  Redness to face/swollen eyes   . Excedrin Extra Strength [Aspirin-Acetaminophen-Caffeine] Diarrhea  . Keflex [Cephalexin] Swelling and Other (See Comments)    Reaction:  Lip swelling   . Meloxicam Other (See Comments)    Reaction:  Decreased renal function  . Methotrexate Sodium Other (See Comments)    Reaction:  Decreased renal function  . Nsaids Other (See Comments)    Reaction:  Unknown   . Quinine Rash  . Quinolones Rash    Current antibiotics: Antibiotics Given (last 72 hours)    Date/Time Action Medication Dose Rate   11/28/15 2023 Given   aztreonam (AZACTAM) 1 g in dextrose 5 % 50 mL IVPB 1 g 100 mL/hr   11/29/15 0146 Given   aztreonam (AZACTAM) 500 mg in dextrose 5 %  50 mL IVPB 500 mg 100 mL/hr   11/29/15 1152 Given   hydroxychloroquine (PLAQUENIL) tablet 200 mg 200 mg    11/29/15 1424 Given   aztreonam (AZACTAM) 500 mg in dextrose 5 % 50 mL IVPB 500 mg 100 mL/hr   11/29/15 2215 Given   aztreonam (AZACTAM) 500 mg in dextrose 5 % 50 mL IVPB 500 mg 100 mL/hr   11/30/15 0509 Given   aztreonam (AZACTAM) 500 mg in dextrose 5 % 50 mL IVPB 500 mg 100 mL/hr      MEDICATIONS: . allopurinol  100 mg Oral BID  . apixaban  2.5 mg Oral BID  . aspirin EC  81 mg Oral Daily  . atorvastatin  10 mg Oral q1800  . aztreonam  500 mg Intravenous Q8H  . cholecalciferol  1,000 Units Oral Daily  . diltiazem  240 mg Oral Daily  . ferrous sulfate  325 mg Oral Q  breakfast  . gabapentin  600 mg Oral TID  . hydroxychloroquine  200 mg Oral Daily  . insulin aspart  0-9 Units Subcutaneous TID WC  . lactulose  20 g Oral BID  . metoprolol tartrate  12.5 mg Oral BID  . omega-3 acid ethyl esters  1 g Oral BID  . pantoprazole  40 mg Oral Daily  . rOPINIRole  0.5 mg Oral QHS  . vitamin B-12  1,000 mcg Oral Daily  . vitamin C  500 mg Oral Daily    Review of Systems - 11 systems reviewed and negative per HPI   OBJECTIVE: Temp:  [98 F (36.7 C)-98.3 F (36.8 C)] 98 F (36.7 C) (06/22 0422) Pulse Rate:  [64-147] 64 (06/22 0422) Resp:  [17-20] 17 (06/22 0422) BP: (144-161)/(45-59) 145/59 mmHg (06/22 0422) SpO2:  [95 %-100 %] 100 % (06/22 0422) Physical Exam  Constitutional: frail, lying in bedHENT: [place]/AT, PERRLA, no scleral icterus Mouth/Throat: Oropharynx is clear and moist. No oropharyngeal exudate.  Cardiovascular: Normal rate, regular rhythm and normal heart sounds.  Pulmonary/Chest: Effort normal and breath sounds normal. Abdominal: Soft. Bowel sounds are normal.  exhibits no distension. There is no tenderness.  Lymphadenopathy: no cervical adenopathy. No axillary adenopathy Neurological: alert and oriented to person, place, and time.  Skin: Skin is warm and dry. No rash noted. No erythema.  Psychiatric: a normal mood and affect.  behavior is normal.    LABS: Results for orders placed or performed during the hospital encounter of 11/28/15 (from the past 48 hour(s))  Urinalysis complete, with microscopic- may I&O cath if menses     Status: Abnormal   Collection Time: 11/28/15  2:23 PM  Result Value Ref Range   Color, Urine YELLOW (A) YELLOW   APPearance TURBID (A) CLEAR   Glucose, UA NEGATIVE NEGATIVE mg/dL   Bilirubin Urine NEGATIVE NEGATIVE   Ketones, ur NEGATIVE NEGATIVE mg/dL   Specific Gravity, Urine 1.020 1.005 - 1.030   Hgb urine dipstick NEGATIVE NEGATIVE   pH 5.0 5.0 - 8.0   Protein, ur 30 (A) NEGATIVE mg/dL   Nitrite NEGATIVE  NEGATIVE   Leukocytes, UA NEGATIVE NEGATIVE   RBC / HPF TOO NUMEROUS TO COUNT 0 - 5 RBC/hpf   WBC, UA TOO NUMEROUS TO COUNT 0 - 5 WBC/hpf   Bacteria, UA FEW (A) NONE SEEN   Squamous Epithelial / LPF TOO NUMEROUS TO COUNT (A) NONE SEEN   Trans Epithel, UA 35    WBC Clumps PRESENT   Basic metabolic panel     Status: Abnormal  Collection Time: 11/28/15  2:23 PM  Result Value Ref Range   Sodium 137 135 - 145 mmol/L   Potassium 3.7 3.5 - 5.1 mmol/L    Comment: HEMOLYSIS AT THIS LEVEL MAY AFFECT RESULT   Chloride 109 101 - 111 mmol/L   CO2 15 (L) 22 - 32 mmol/L   Glucose, Bld 141 (H) 65 - 99 mg/dL   BUN 52 (H) 6 - 20 mg/dL   Creatinine, Ser 3.28 (H) 0.44 - 1.00 mg/dL   Calcium 9.0 8.9 - 10.3 mg/dL   GFR calc non Af Amer 12 (L) >60 mL/min   GFR calc Af Amer 13 (L) >60 mL/min    Comment: (NOTE) The eGFR has been calculated using the CKD EPI equation. This calculation has not been validated in all clinical situations. eGFR's persistently <60 mL/min signify possible Chronic Kidney Disease.    Anion gap 13 5 - 15  CBC     Status: Abnormal   Collection Time: 11/28/15  2:23 PM  Result Value Ref Range   WBC 13.0 (H) 3.6 - 11.0 K/uL   RBC 2.87 (L) 3.80 - 5.20 MIL/uL   Hemoglobin 9.6 (L) 12.0 - 16.0 g/dL   HCT 29.7 (L) 35.0 - 47.0 %   MCV 103.3 (H) 80.0 - 100.0 fL   MCH 33.3 26.0 - 34.0 pg   MCHC 32.2 32.0 - 36.0 g/dL   RDW 19.7 (H) 11.5 - 14.5 %   Platelets 438 150 - 440 K/uL  Hepatic function panel     Status: Abnormal   Collection Time: 11/28/15  2:23 PM  Result Value Ref Range   Total Protein 6.1 (L) 6.5 - 8.1 g/dL   Albumin 2.9 (L) 3.5 - 5.0 g/dL   AST 26 15 - 41 U/L   ALT 24 14 - 54 U/L   Alkaline Phosphatase 93 38 - 126 U/L   Total Bilirubin 0.6 0.3 - 1.2 mg/dL   Bilirubin, Direct 0.2 0.1 - 0.5 mg/dL   Indirect Bilirubin 0.4 0.3 - 0.9 mg/dL  Troponin I     Status: Abnormal   Collection Time: 11/28/15  2:23 PM  Result Value Ref Range   Troponin I 0.06 (H) <0.031 ng/mL     Comment: READ BACK AND VERIFIED WITH GRACE [facility] RN AT 3833 11/28/15 MSS.        PERSISTENTLY INCREASED TROPONIN VALUES IN THE RANGE OF 0.04-0.49 ng/mL CAN BE SEEN IN:       -UNSTABLE ANGINA       -CONGESTIVE HEART FAILURE       -MYOCARDITIS       -CHEST TRAUMA       -ARRYHTHMIAS       -LATE PRESENTING MYOCARDIAL INFARCTION       -COPD   CLINICAL FOLLOW-UP RECOMMENDED.   Urine culture     Status: None   Collection Time: 11/28/15  2:23 PM  Result Value Ref Range   Specimen Description URINE, RANDOM    Special Requests NONE    Culture NO GROWTH Performed at [facility]     Report Status 11/29/2015 FINAL   Blood culture (routine x 2)     Status: None (Preliminary result)   Collection Time: 11/28/15  2:45 PM  Result Value Ref Range   Specimen Description BLOOD LEFT FA    Special Requests      BOTTLES DRAWN AEROBIC AND ANAEROBIC AER 4ML ANA 3ML   Culture NO GROWTH 2 DAYS    Report Status PENDING  Blood culture (routine x 2)     Status: None (Preliminary result)   Collection Time: 11/28/15  2:50 PM  Result Value Ref Range   Specimen Description BLOOD RIGHT AC    Special Requests      BOTTLES DRAWN AEROBIC AND ANAEROBIC AER 7ML ANA 6ML   Culture NO GROWTH 2 DAYS    Report Status PENDING   Blood gas, venous     Status: Abnormal   Collection Time: 11/28/15  3:00 PM  Result Value Ref Range   pH, Ven 7.27 (L) 7.320 - 7.430   pCO2, Ven 47 44.0 - 60.0 mmHg   pO2, Ven 32.0 31.0 - 45.0 mmHg   Bicarbonate 21.6 21.0 - 28.0 mEq/L   Acid-base deficit 5.2 (H) 0.0 - 2.0 mmol/L   O2 Saturation 51.4 %   Patient temperature 37.0    Collection site VEIN    Sample type VEIN   Glucose, capillary     Status: Abnormal   Collection Time: 11/28/15  6:26 PM  Result Value Ref Range   Glucose-Capillary 167 (H) 65 - 99 mg/dL  Glucose, capillary     Status: Abnormal   Collection Time: 11/28/15  9:55 PM  Result Value Ref Range   Glucose-Capillary 138 (H) 65 - 99 mg/dL  Basic  metabolic panel     Status: Abnormal   Collection Time: 11/29/15  5:02 AM  Result Value Ref Range   Sodium 139 135 - 145 mmol/L   Potassium 3.4 (L) 3.5 - 5.1 mmol/L   Chloride 111 101 - 111 mmol/L   CO2 21 (L) 22 - 32 mmol/L   Glucose, Bld 104 (H) 65 - 99 mg/dL   BUN 50 (H) 6 - 20 mg/dL   Creatinine, Ser 2.90 (H) 0.44 - 1.00 mg/dL   Calcium 8.6 (L) 8.9 - 10.3 mg/dL   GFR calc non Af Amer 13 (L) >60 mL/min   GFR calc Af Amer 16 (L) >60 mL/min    Comment: (NOTE) The eGFR has been calculated using the CKD EPI equation. This calculation has not been validated in all clinical situations. eGFR's persistently <60 mL/min signify possible Chronic Kidney Disease.    Anion gap 7 5 - 15  CBC     Status: Abnormal   Collection Time: 11/29/15  5:02 AM  Result Value Ref Range   WBC 13.6 (H) 3.6 - 11.0 K/uL   RBC 2.52 (L) 3.80 - 5.20 MIL/uL   Hemoglobin 8.5 (L) 12.0 - 16.0 g/dL   HCT 25.7 (L) 35.0 - 47.0 %   MCV 102.0 (H) 80.0 - 100.0 fL   MCH 33.6 26.0 - 34.0 pg   MCHC 33.0 32.0 - 36.0 g/dL   RDW 19.3 (H) 11.5 - 14.5 %   Platelets 405 150 - 440 K/uL  Glucose, capillary     Status: None   Collection Time: 11/29/15  7:34 AM  Result Value Ref Range   Glucose-Capillary 83 65 - 99 mg/dL   Comment 1 Notify RN   Glucose, capillary     Status: Abnormal   Collection Time: 11/29/15 11:13 AM  Result Value Ref Range   Glucose-Capillary 111 (H) 65 - 99 mg/dL   Comment 1 Notify RN   Glucose, capillary     Status: Abnormal   Collection Time: 11/29/15  4:17 PM  Result Value Ref Range   Glucose-Capillary 113 (H) 65 - 99 mg/dL  Glucose, capillary     Status: Abnormal   Collection Time: 11/29/15  8:58  PM  Result Value Ref Range   Glucose-Capillary 120 (H) 65 - 99 mg/dL  CBC     Status: Abnormal   Collection Time: 11/30/15  1:23 AM  Result Value Ref Range   WBC 15.5 (H) 3.6 - 11.0 K/uL   RBC 2.68 (L) 3.80 - 5.20 MIL/uL   Hemoglobin 9.0 (L) 12.0 - 16.0 g/dL   HCT 27.2 (L) 35.0 - 47.0 %   MCV  101.6 (H) 80.0 - 100.0 fL   MCH 33.5 26.0 - 34.0 pg   MCHC 33.0 32.0 - 36.0 g/dL   RDW 19.6 (H) 11.5 - 14.5 %   Platelets 429 150 - 440 K/uL  Troponin I     Status: Abnormal   Collection Time: 11/30/15  1:23 AM  Result Value Ref Range   Troponin I 0.08 (H) <0.031 ng/mL    Comment: READ BACK AND VERIFIED WITH SARAH KOENNER @ 0222 ON 11/30/2015 BY CAF        PERSISTENTLY INCREASED TROPONIN VALUES IN THE RANGE OF 0.04-0.49 ng/mL CAN BE SEEN IN:       -UNSTABLE ANGINA       -CONGESTIVE HEART FAILURE       -MYOCARDITIS       -CHEST TRAUMA       -ARRYHTHMIAS       -LATE PRESENTING MYOCARDIAL INFARCTION       -COPD   CLINICAL FOLLOW-UP RECOMMENDED.   Glucose, capillary     Status: None   Collection Time: 11/30/15  7:31 AM  Result Value Ref Range   Glucose-Capillary 90 65 - 99 mg/dL   No components found for: ESR, C REACTIVE PROTEIN MICRO: Recent Results (from the past 720 hour(s))  Urine culture     Status: None   Collection Time: 11/21/15  7:45 PM  Result Value Ref Range Status   Specimen Description URINE, CATHETERIZED  Final   Special Requests NONE  Final   Culture NO GROWTH Performed at [facility]   Final   Report Status 11/23/2015 FINAL  Final  Blood Culture (routine x 2)     Status: Abnormal   Collection Time: 11/21/15  7:50 PM  Result Value Ref Range Status   Specimen Description BLOOD RIGHT FOREARM  Final   Special Requests BOTTLES DRAWN AEROBIC AND ANAEROBIC  0.5CC  Final   Culture  Setup Time   Final    GRAM POSITIVE RODS AEROBIC BOTTLE ONLY CRITICAL RESULT CALLED TO, READ BACK BY AND VERIFIED WITH: LEAH LEE 11/24/15 1000 BY [facility]    Culture (A)  Final    DIPHTHEROIDS(CORYNEBACTERIUM SPECIES) Standardized susceptibility testing for this organism is not available. Performed at [facility]    Report Status 11/26/2015 FINAL  Final  Blood Culture (routine x 2)     Status: Abnormal   Collection Time: 11/21/15  7:50 PM  Result Value Ref Range  Status   Specimen Description BLOOD LEFT WRIST  Final   Special Requests BOTTLES DRAWN AEROBIC AND ANAEROBIC  0.5CC  Final   Culture  Setup Time   Final    GRAM POSITIVE COCCI IN BOTH AEROBIC AND ANAEROBIC BOTTLES CRITICAL RESULT CALLED TO, READ BACK BY AND VERIFIED WITH: MATT MCBANE @ 2703 ON 11/22/2015 BY CAF CONFIRMED BY TLB    Culture (A)  Final    STAPHYLOCOCCUS SPECIES (COAGULASE NEGATIVE) THE SIGNIFICANCE OF ISOLATING THIS ORGANISM FROM A SINGLE SET OF BLOOD CULTURES WHEN MULTIPLE SETS ARE DRAWN IS UNCERTAIN. PLEASE NOTIFY THE MICROBIOLOGY DEPARTMENT WITHIN  ONE WEEK IF SPECIATION AND SENSITIVITIES ARE REQUIRED. Performed at [facility]    Report Status 11/27/2015 FINAL  Final  Blood Culture ID Panel (Reflexed)     Status: Abnormal   Collection Time: 11/21/15  7:50 PM  Result Value Ref Range Status   Enterococcus species NOT DETECTED NOT DETECTED Final   Vancomycin resistance NOT DETECTED NOT DETECTED Final   Listeria monocytogenes NOT DETECTED NOT DETECTED Final   Staphylococcus species DETECTED (A) NOT DETECTED Final    Comment: CRITICAL RESULT CALLED TO, READ BACK BY AND VERIFIED WITH: MATT MCBANE @ 2831 ON 11/22/2015 BY CAF    Staphylococcus aureus NOT DETECTED NOT DETECTED Final   Methicillin resistance DETECTED (A) NOT DETECTED Final    Comment: CRITICAL RESULT CALLED TO, READ BACK BY AND VERIFIED WITH: MATT MCBANE @ 5176 ON 11/22/2015 BY CAF    Streptococcus species NOT DETECTED NOT DETECTED Final   Streptococcus agalactiae NOT DETECTED NOT DETECTED Final   Streptococcus pneumoniae NOT DETECTED NOT DETECTED Final   Streptococcus pyogenes NOT DETECTED NOT DETECTED Final   Acinetobacter baumannii NOT DETECTED NOT DETECTED Final   Enterobacteriaceae species NOT DETECTED NOT DETECTED Final   Enterobacter cloacae complex NOT DETECTED NOT DETECTED Final   Escherichia coli NOT DETECTED NOT DETECTED Final   Klebsiella oxytoca NOT DETECTED NOT DETECTED Final    Klebsiella pneumoniae NOT DETECTED NOT DETECTED Final   Proteus species NOT DETECTED NOT DETECTED Final   Serratia marcescens NOT DETECTED NOT DETECTED Final   Carbapenem resistance NOT DETECTED NOT DETECTED Final   Haemophilus influenzae NOT DETECTED NOT DETECTED Final   Neisseria meningitidis NOT DETECTED NOT DETECTED Final   Pseudomonas aeruginosa NOT DETECTED NOT DETECTED Final   Candida albicans NOT DETECTED NOT DETECTED Final   Candida glabrata NOT DETECTED NOT DETECTED Final   Candida krusei NOT DETECTED NOT DETECTED Final   Candida parapsilosis NOT DETECTED NOT DETECTED Final   Candida tropicalis NOT DETECTED NOT DETECTED Final  Urine culture     Status: Abnormal   Collection Time: 11/24/15  4:32 PM  Result Value Ref Range Status   Specimen Description URINE, RANDOM  Final   Special Requests NONE  Final   Culture (A)  Final    <10,000 COLONIES/mL INSIGNIFICANT GROWTH Performed at [facility]    Report Status 11/26/2015 FINAL  Final  Urine culture     Status: None   Collection Time: 11/28/15  2:23 PM  Result Value Ref Range Status   Specimen Description URINE, RANDOM  Final   Special Requests NONE  Final   Culture NO GROWTH Performed at [facility]   Final   Report Status 11/29/2015 FINAL  Final  Blood culture (routine x 2)     Status: None (Preliminary result)   Collection Time: 11/28/15  2:45 PM  Result Value Ref Range Status   Specimen Description BLOOD LEFT FA  Final   Special Requests   Final    BOTTLES DRAWN AEROBIC AND ANAEROBIC AER 4ML ANA 3ML   Culture NO GROWTH 2 DAYS  Final   Report Status PENDING  Incomplete  Blood culture (routine x 2)     Status: None (Preliminary result)   Collection Time: 11/28/15  2:50 PM  Result Value Ref Range Status   Specimen Description BLOOD RIGHT AC  Final   Special Requests   Final    BOTTLES DRAWN AEROBIC AND ANAEROBIC AER 7ML ANA 6ML   Culture NO GROWTH 2 DAYS  Final  Report Status PENDING  Incomplete     IMAGING: Dg Chest 1 View  11/28/2015  CLINICAL DATA:  Chest pain today. EXAM: CHEST 1 VIEW COMPARISON:  11/22/2015 FINDINGS: The heart is enlarged but stable. There is tortuosity and calcification of the thoracic aorta. The lungs demonstrate mild chronic bronchitic changes and streaky atelectasis but no definite infiltrates or effusions. The bony thorax is intact. IMPRESSION: 1. Chronic bronchitic changes and streaky areas of subsegmental atelectasis but no definite infiltrates or effusions. 2. Stable mild cardiac enlargement. 3. Stable aortic atherosclerosis. Electronically Signed   By: Marijo Sanes M.D.   On: 11/28/2015 15:02   Dg Chest 1 View  11/22/2015  CLINICAL DATA:  Acute onset of generalized weakness. Code sepsis. Initial encounter. EXAM: CHEST 1 VIEW COMPARISON:  Chest radiograph performed 11/21/2015 FINDINGS: The lungs are well-aerated. Mild bibasilar airspace opacities may reflect mild pneumonia. There is no evidence of pleural effusion or pneumothorax. The cardiomediastinal silhouette is enlarged. No acute osseous abnormalities are seen. IMPRESSION: 1. Mild bibasilar opacities may reflect mild pneumonia. 2. Cardiomegaly. Electronically Signed   By: Garald Balding M.D.   On: 11/22/2015 18:32   Dg Chest Port 1 View  11/21/2015  CLINICAL DATA:  Family states pt ate some ice cream tonight then went to the bathroom and became weak with uncontrolled shaking. New onset of Afib. History of HTN, stroke, diabetic EXAM: PORTABLE CHEST 1 VIEW COMPARISON:  01/11/2014 FINDINGS: Cardiac silhouette is mildly enlarged. No mediastinal or hilar masses or evidence of adenopathy. Clear lungs. No pleural effusion or pneumothorax. The bony thorax is demineralized but grossly intact. IMPRESSION: No acute cardiopulmonary disease. Electronically Signed   By: Lajean Manes M.D.   On: 11/21/2015 20:05   Ct Renal Stone Study  11/28/2015  CLINICAL DATA:  Worsening weakness and lethargy status post treated urinary  sepsis. Abdominal pain. Difficulty urinating. EXAM: CT ABDOMEN AND PELVIS WITHOUT CONTRAST TECHNIQUE: Multidetector CT imaging of the abdomen and pelvis was performed following the standard protocol without IV contrast. COMPARISON:  Renal ultrasound 01/12/2014 FINDINGS: Lower chest: Evaluation of lung parenchyma is precluded by breathing motion artifact. There are bilateral small pleural effusions with associated compressive bibasilar atelectasis. Mitral and aortic valve annular calcifications. Hepatobiliary: No mass visualized on this un-enhanced exam. Postcholecystectomy. Pancreas: No mass or inflammatory process identified on this un-enhanced exam. Spleen: Within normal limits in size. Adrenals/Urinary Tract: No evidence of urolithiasis or hydronephrosis. No definite mass visualized on this un-enhanced exam. Mildly atrophic kidneys. Stomach/Bowel: No evidence of obstruction, inflammatory process, or abnormal fluid collections. Vascular/Lymphatic: No pathologically enlarged lymph nodes. No evidence of abdominal aortic aneurysm. Mild diffuse mesenteric stranding. Advanced calcific atherosclerotic disease of the aorta and its main attributes. Reproductive: No mass or other significant abnormality. The urinary bladder is not well evaluated as it is decompressed around urinary Foley. Other: Chest wall edema. Musculoskeletal: Reversed S shaped thoraco lumbar spine scoliosis with advanced osteoarthritic changes. No suspicious osseous lesions. IMPRESSION: Bilateral pleural effusions with bibasilar hypoventilatory changes. No evidence of acute abnormality within the solid abdominal organs. Somewhat atrophic kidneys. Diffuse mesenteric stranding and chest and abdominal wall edema, likely due to third spacing. Advanced calcific atherosclerotic disease of the aorta. Mitral and aortic valve annular calcifications. Electronically Signed   By: Fidela Salisbury M.D.   On: 11/28/2015 16:04    Assessment:   Kelsey Chung  is a 82 y.o. female with recurrent UTIs UA on admission with TNTC wbc. Several prior cxs negative. CT renal protocol with no stones  or hydronephrosis Seems to have urinary retention as etiology of recurrent infections given the finding of 500 cc urine on foley placement.  Recommendations Continue aztreoman penidng ucx Consult urology for evaluation - given impressive inflammation with negative cultures and evidence of urinary obstruction needs evaluation.  Thank you very much for allowing me to participate in the care of this patient. Please call with questions.   Cheral Marker. Ola Spurr, MD

## 2015-11-30 NOTE — Progress Notes (Unsigned)
Consult History and Physical   SERVICE: Gynecology at [facility]  Patient Name: Kelsey Chung Patient MRN:   [id number]  CC: Called for vaginal dc starting    With      HPI: Kelsey Chung is a 82 y.o. No obstetric history on file. With [facility].    Review of Systems: positives in bold GEN: no  fevers, chills, weight changes, appetite changes, fatigue, night sweats HEENT:  HA, vision changes, hearing loss, congestion, rhinorrhea, sinus pressure, dysphagia CV:   CP, palpitations PULM:  SOB, cough GI:  abd pain, N/V/D/C GU:  dysuria, urgency, frequency MSK:  arthralgias, myalgias, back pain, swelling SKIN:  rashes, color changes, pallor NEURO:  numbness, weakness, tingling, seizures, dizziness, tremors PSYCH:  depression, anxiety, behavioral problems, confusion  HEME/LYMPH:  easy bruising or bleeding ENDO:  heat/cold intolerance  Past Obstetrical History: OB History    No data available      Past Gynecologic History: No LMP recorded. Patient is not currently having periods (Reason: Other). Menstrual frequency absent due to post-menopausal. No episodes of vag bleeding.  Past Medical History: Past Medical History  Diagnosis Date  . Diabetes (HCC)   . Hypertension   . Rheumatoid arthritis(714.0)   . Hyperlipidemia   . Gout   . Osteoarthritis (arthritis due to wear and tear of joints)   . Scoliosis   . Constipation, chronic   . CKD (chronic kidney disease) stage 4, GFR 15-29 ml/min (HCC)   . New onset atrial fibrillation (HCC)     a. diagnosed in 11/2015 - admitted for sepsis  . TIA (transient ischemic attack)     a. multiple TIA's 20+ years ago according to patient's daughter    Past Surgical History:   Past Surgical History  Procedure Laterality Date  . Hernia repair    . Gallbladder surgery    . Appendectomy    . Ovarian cyst surgery    . Foot surgery Right   . Eye surgery Bilateral   . Breast cyst excision Left   . Cardiac catheterization    .  Colonoscopy    . Esophagogastroduodenoscopy endoscopy    . Dilation and curettage of uterus    . Esophageal manometry N/A 05/24/2015    Procedure: ESOPHAGEAL MANOMETRY (EM);  Surgeon: Elnita Maxwell, MD;  Location: [facility] ENDOSCOPY;  Service: Endoscopy;  Laterality: N/A;    Family History:  family history includes Breast cancer in her mother; CAD in her father.  Social History:  Social History   Social History  . Marital Status: Widowed    Spouse Name: N/A  . Number of Children: N/A  . Years of Education: N/A   Occupational History  . Not on file.   Social History Main Topics  . Smoking status: Never Smoker   . Smokeless tobacco: Never Used  . Alcohol Use: No  . Drug Use: No  . Sexual Activity: Not on file   Other Topics Concern  . Not on file   Social History Narrative    Home Medications:  Medications reconciled in EPIC  Current Facility-Administered Medications on File Prior to Visit  Medication Dose Route Frequency Provider Last Rate Last Dose  . acetaminophen (TYLENOL) tablet 650 mg  650 mg Oral Q4H PRN Ramonita Lab, MD      . allopurinol (ZYLOPRIM) tablet 100 mg  100 mg Oral BID Altamese Dilling, MD   100 mg at 11/30/15 1051  . apixaban (ELIQUIS) tablet 2.5 mg  2.5 mg Oral BID  Altamese Dilling, MD   2.5 mg at 11/30/15 1051  . aspirin EC tablet 81 mg  81 mg Oral Daily Altamese Dilling, MD   81 mg at 11/30/15 1051  . atorvastatin (LIPITOR) tablet 10 mg  10 mg Oral q1800 Altamese Dilling, MD   10 mg at 11/29/15 1737  . aztreonam (AZACTAM) 500 mg in dextrose 5 % 50 mL IVPB  500 mg Intravenous Q8H Altamese Dilling, MD   500 mg at 11/30/15 1445  . cholecalciferol (VITAMIN D) tablet 1,000 Units  1,000 Units Oral Daily Altamese Dilling, MD   1,000 Units at 11/30/15 1051  . diltiazem (CARDIZEM CD) 24 hr capsule 240 mg  240 mg Oral Daily Altamese Dilling, MD   240 mg at 11/30/15 1051  . ferrous sulfate tablet 325 mg  325 mg Oral Q  breakfast Altamese Dilling, MD   325 mg at 11/30/15 1051  . gabapentin (NEURONTIN) capsule 600 mg  600 mg Oral TID Altamese Dilling, MD   600 mg at 11/30/15 1618  . hydroxychloroquine (PLAQUENIL) tablet 200 mg  200 mg Oral Daily Altamese Dilling, MD   200 mg at 11/30/15 1051  . insulin aspart (novoLOG) injection 0-9 Units  0-9 Units Subcutaneous TID WC Altamese Dilling, MD   1 Units at 11/30/15 1247  . lactulose (CHRONULAC) 10 GM/15ML solution 20 g  20 g Oral BID Altamese Dilling, MD   20 g at 11/29/15 2136  . metoprolol tartrate (LOPRESSOR) tablet 12.5 mg  12.5 mg Oral BID Altamese Dilling, MD   12.5 mg at 11/30/15 1052  . nitroGLYCERIN (NITROSTAT) SL tablet 0.4 mg  0.4 mg Sublingual Q5 min PRN Debby Crosley, MD   0.4 mg at 11/30/15 0103  . omega-3 acid ethyl esters (LOVAZA) capsule 1 g  1 g Oral BID Altamese Dilling, MD   1 g at 11/30/15 1051  . pantoprazole (PROTONIX) EC tablet 40 mg  40 mg Oral Daily Altamese Dilling, MD   40 mg at 11/30/15 1051  . rOPINIRole (REQUIP) tablet 0.5 mg  0.5 mg Oral QHS Altamese Dilling, MD   0.5 mg at 11/29/15 2136  . traMADol (ULTRAM) tablet 50 mg  50 mg Oral Q8H PRN Altamese Dilling, MD   50 mg at 11/29/15 2136  . vitamin B-12 (CYANOCOBALAMIN) tablet 1,000 mcg  1,000 mcg Oral Daily Altamese Dilling, MD   1,000 mcg at 11/30/15 1051  . vitamin C (ASCORBIC ACID) tablet 500 mg  500 mg Oral Daily Altamese Dilling, MD   500 mg at 11/30/15 1051   Current Outpatient Prescriptions on File Prior to Visit  Medication Sig Dispense Refill  . allopurinol (ZYLOPRIM) 100 MG tablet Take 100 mg by mouth 2 (two) times daily.     Marland Kitchen apixaban (ELIQUIS) 2.5 MG TABS tablet Take 1 tablet (2.5 mg total) by mouth 2 (two) times daily. 60 tablet 1  . aspirin EC 81 MG tablet Take 81 mg by mouth every evening.     . cholecalciferol (VITAMIN D) 1000 units tablet Take 1,000 Units by mouth daily.    Marland Kitchen diltiazem (CARDIZEM CD) 240 MG 24  hr capsule Take 1 capsule (240 mg total) by mouth daily. 30 capsule 0  . ferrous sulfate 325 (65 FE) MG tablet Take 325 mg by mouth daily with breakfast.    . gabapentin (NEURONTIN) 600 MG tablet Take 600 mg by mouth 3 (three) times daily.    Marland Kitchen glipiZIDE (GLUCOTROL) 5 MG tablet Take 5 mg by mouth 2 (two) times daily  before a meal.     . glucosamine-chondroitin 500-400 MG tablet Take 1 tablet by mouth daily.    . hydrochlorothiazide (HYDRODIURIL) 25 MG tablet Take 25 mg by mouth daily.     . hydroxychloroquine (PLAQUENIL) 200 MG tablet Take 200 mg by mouth daily.    Marland Kitchen lactulose (CHRONULAC) 10 GM/15ML solution Take 20 g by mouth 2 (two) times daily.     . metoprolol tartrate (LOPRESSOR) 25 MG tablet Take 0.5 tablets (12.5 mg total) by mouth 2 (two) times daily. 60 tablet 0  . mupirocin cream (BACTROBAN) 2 % Apply 1 application topically at bedtime as needed (for ulcers on feet).     Marland Kitchen omega-3 acid ethyl esters (LOVAZA) 1 g capsule Take 1 g by mouth 2 (two) times daily.    . pantoprazole (PROTONIX) 40 MG tablet Take 40 mg by mouth daily.     Marland Kitchen rOPINIRole (REQUIP) 0.5 MG tablet Take 0.5 mg by mouth at bedtime.    . sulfamethoxazole-trimethoprim (BACTRIM DS,SEPTRA DS) 800-160 MG tablet Take 1 tablet by mouth daily. 14 tablet 0  . traMADol (ULTRAM) 50 MG tablet Take 50 mg by mouth every 8 (eight) hours as needed for moderate pain.     . Turmeric 500 MG TABS Take 500 mg by mouth daily.    . vitamin B-12 (CYANOCOBALAMIN) 1000 MCG tablet Take 1,000 mcg by mouth daily.    . vitamin C (ASCORBIC ACID) 500 MG tablet Take 500 mg by mouth daily.      Allergies:  Allergies  Allergen Reactions  . Augmentin [Amoxicillin-Pot Clavulanate] Anaphylaxis and Other (See Comments)    Has patient had a PCN reaction causing immediate rash, facial/tongue/throat swelling, SOB or lightheadedness with hypotension: Yes Has patient had a PCN reaction causing severe rash involving mucus membranes or skin necrosis: No Has  patient had a PCN reaction that required hospitalization No Has patient had a PCN reaction occurring within the last 10 years: Yes If all of the above answers are "NO", then may proceed with Cephalosporin use.  . Beta Adrenergic Blockers Other (See Comments)    Reaction:  Unknown  . Clindamycin Other (See Comments)    Reaction:  Redness to face/swollen eyes   . Excedrin Extra Strength [Aspirin-Acetaminophen-Caffeine] Diarrhea  . Keflex [Cephalexin] Swelling and Other (See Comments)    Reaction:  Lip swelling   . Meloxicam Other (See Comments)    Reaction:  Decreased renal function  . Methotrexate Sodium Other (See Comments)    Reaction:  Decreased renal function  . Nsaids Other (See Comments)    Reaction:  Unknown   . Quinine Rash  . Quinolones Rash    Physical Exam:  BP: 145/59-170/59 P:83 R: 20 Pulse ox: 94-100   General Appearance:  Well developed, well nourished, no acute distress, alert and oriented x3 HEENT:  Normocephalic atraumatic, moist mucous membranes Skin:  normal coloration and turgor, no rashes, warm and dry Pelvic:  NEFG, no vulvar masses or lesions, decreased  vaginal mucosa, no vaginal bleeding, scant mucoid dc at the introital opening, Unable to visualize the cx as no exam room used and placed on upside inverted bedpan to insert speculum, No uterine masses palp, no adnexal masses appreciated,   no pelvic organ prolapse,    Labs/Studies:   CBC and Coags:  Lab Results  Component Value Date   WBC 15.5* 11/30/2015   NEUTOPHILPCT 80 11/21/2015   EOSPCT 1 11/21/2015   BASOPCT 1 11/21/2015   LYMPHOPCT 7 11/21/2015  HGB 9.0* 11/30/2015   HCT 27.2* 11/30/2015   MCV 101.6* 11/30/2015   PLT 429 11/30/2015   INR 1.03 11/21/2015   CMP:  Lab Results  Component Value Date   NA 139 11/29/2015   K 3.4* 11/29/2015   CL 111 11/29/2015   CO2 21* 11/29/2015   BUN 50* 11/29/2015   CREATININE 2.90* 11/29/2015   CREATININE 3.28* 11/28/2015   CREATININE 1.84*  11/26/2015   PROT 6.1* 11/28/2015   BILITOT 0.6 11/28/2015   BILIDIR 0.2 11/28/2015   ALT 24 11/28/2015   AST 26 11/28/2015   ALKPHOS 93 11/28/2015   Other Labs: Wet Prep: neg Clue, Neg Trich, neg Hyphae  Other Imaging: Dg Chest 1 View  11/28/2015  CLINICAL DATA:  Chest pain today. EXAM: CHEST 1 VIEW COMPARISON:  11/22/2015 FINDINGS: The heart is enlarged but stable. There is tortuosity and calcification of the thoracic aorta. The lungs demonstrate mild chronic bronchitic changes and streaky atelectasis but no definite infiltrates or effusions. The bony thorax is intact. IMPRESSION: 1. Chronic bronchitic changes and streaky areas of subsegmental atelectasis but no definite infiltrates or effusions. 2. Stable mild cardiac enlargement. 3. Stable aortic atherosclerosis. Electronically Signed   By: Rudie Meyer M.D.   On: 11/28/2015 15:02   Dg Chest 1 View  11/22/2015  CLINICAL DATA:  Acute onset of generalized weakness. Code sepsis. Initial encounter. EXAM: CHEST 1 VIEW COMPARISON:  Chest radiograph performed 11/21/2015 FINDINGS: The lungs are well-aerated. Mild bibasilar airspace opacities may reflect mild pneumonia. There is no evidence of pleural effusion or pneumothorax. The cardiomediastinal silhouette is enlarged. No acute osseous abnormalities are seen. IMPRESSION: 1. Mild bibasilar opacities may reflect mild pneumonia. 2. Cardiomegaly. Electronically Signed   By: Roanna Raider M.D.   On: 11/22/2015 18:32   Ct Head Wo Contrast  11/30/2015  CLINICAL DATA:  Bilateral upper extremity weakness right worse than left. EXAM: CT HEAD WITHOUT CONTRAST TECHNIQUE: Contiguous axial images were obtained from the base of the skull through the vertex without intravenous contrast. COMPARISON:  07/25/2006 FINDINGS: The ventricles, cisterns and other CSF spaces are within normal. There is mild-to-moderate chronic ischemic microvascular disease. No evidence of mass, mass effect, shift of midline structures or  acute hemorrhage. No evidence of acute infarction. Remaining bones and soft tissues are within normal. IMPRESSION: No acute intracranial findings. Chronic ischemic microvascular disease. Electronically Signed   By: Elberta Fortis M.D.   On: 11/30/2015 16:52   Dg Chest Port 1 View  11/21/2015  CLINICAL DATA:  Family states pt ate some ice cream tonight then went to the bathroom and became weak with uncontrolled shaking. New onset of Afib. History of HTN, stroke, diabetic EXAM: PORTABLE CHEST 1 VIEW COMPARISON:  01/11/2014 FINDINGS: Cardiac silhouette is mildly enlarged. No mediastinal or hilar masses or evidence of adenopathy. Clear lungs. No pleural effusion or pneumothorax. The bony thorax is demineralized but grossly intact. IMPRESSION: No acute cardiopulmonary disease. Electronically Signed   By: Amie Portland M.D.   On: 11/21/2015 20:05   Ct Renal Stone Study  11/28/2015  CLINICAL DATA:  Worsening weakness and lethargy status post treated urinary sepsis. Abdominal pain. Difficulty urinating. EXAM: CT ABDOMEN AND PELVIS WITHOUT CONTRAST TECHNIQUE: Multidetector CT imaging of the abdomen and pelvis was performed following the standard protocol without IV contrast. COMPARISON:  Renal ultrasound 01/12/2014 FINDINGS: Lower chest: Evaluation of lung parenchyma is precluded by breathing motion artifact. There are bilateral small pleural effusions with associated compressive bibasilar atelectasis. Mitral and aortic valve  annular calcifications. Hepatobiliary: No mass visualized on this un-enhanced exam. Postcholecystectomy. Pancreas: No mass or inflammatory process identified on this un-enhanced exam. Spleen: Within normal limits in size. Adrenals/Urinary Tract: No evidence of urolithiasis or hydronephrosis. No definite mass visualized on this un-enhanced exam. Mildly atrophic kidneys. Stomach/Bowel: No evidence of obstruction, inflammatory process, or abnormal fluid collections. Vascular/Lymphatic: No  pathologically enlarged lymph nodes. No evidence of abdominal aortic aneurysm. Mild diffuse mesenteric stranding. Advanced calcific atherosclerotic disease of the aorta and its main attributes. Reproductive: No mass or other significant abnormality. The urinary bladder is not well evaluated as it is decompressed around urinary Foley. Other: Chest wall edema. Musculoskeletal: Reversed S shaped thoraco lumbar spine scoliosis with advanced osteoarthritic changes. No suspicious osseous lesions. IMPRESSION: Bilateral pleural effusions with bibasilar hypoventilatory changes. No evidence of acute abnormality within the solid abdominal organs. Somewhat atrophic kidneys. Diffuse mesenteric stranding and chest and abdominal wall edema, likely due to third spacing. Advanced calcific atherosclerotic disease of the aorta. Mitral and aortic valve annular calcifications. Electronically Signed   By: Ted Mcalpine M.D.   On: 11/28/2015 16:04     Assessment / Plan:   Kelsey Chung is a 82 y.o. No obstetric history on file. who presents with vag dc today and consult placed for [facility] to do a pelvic and evaluate pt.   1. New onset of At Fib 2. Vaginal dc neg for BV, Trich, yeast   Thank you for the opportunity to be involved with this pt's care.  Sharee Pimple, MSN, CNM, FNP

## 2015-11-30 NOTE — Care Management Important Message (Signed)
Important Message  Patient Details  Name: Kelsey Chung MRN: [id number] Date of Birth: 1923/09/07   Medicare Important Message Given:  Yes    [name] A Shaydon Lease 11/30/2015, 10:54 AM

## 2015-11-30 NOTE — Consult Note (Signed)
Consult History and Physical   SERVICE: Gynecology at [facility]  Patient Name: Kelsey Chung Patient MRN:   [id number]  CC: Called for vaginal dc starting today and pt has had multiple UTI's in the past. Denies any vag itching or discomfort or vag odor.       HPI: Kelsey Chung is a 82 y.o. No obstetric history on file. With [facility].   Review of Systems: positives in bold GEN: no  fevers, chills, weight changes, appetite changes, fatigue, night sweats HEENT: no   HA, vision changes, hearing loss, congestion, rhinorrhea, sinus pressure, dysphagia CV: no CP, palpitations PULM:no   SOB, cough GI: no abd pain, N/V/D/C GU:no dysuria, urgency, frequency MSK: no arthralgias, myalgias, back pain, swelling SKIN:no rashes, color changes, pallor NEURO:no   numbness, weakness, tingling, seizures, dizziness, tremors PSYCH: no  depression, anxiety, behavioral problems, confusion  HEME/LYMPH:  easy bruising or bleeding ENDO:  heat/cold intolerance  Past Obstetrical History: OB History    No data available      Past Gynecologic History: No LMP recorded. Patient is not currently having periods (Reason: Other). Menstrual frequency absent due to post-menopausal. No episodes of vag bleeding.  Past Medical History: Past Medical History  Diagnosis Date  . Diabetes (HCC)   . Hypertension   . Rheumatoid arthritis(714.0)   . Hyperlipidemia   . Gout   . Osteoarthritis (arthritis due to wear and tear of joints)   . Scoliosis   . Constipation, chronic   . CKD (chronic kidney disease) stage 4, GFR 15-29 ml/min (HCC)   . New onset atrial fibrillation (HCC)     a. diagnosed in 11/2015 - admitted for sepsis  . TIA (transient ischemic attack)     a. multiple TIA's 20+ years ago according to patient's daughter    Past Surgical History:   Past Surgical History  Procedure Laterality Date  . Hernia repair    . Gallbladder surgery    . Appendectomy    . Ovarian cyst surgery    . Foot  surgery Right   . Eye surgery Bilateral   . Breast cyst excision Left   . Cardiac catheterization    . Colonoscopy    . Esophagogastroduodenoscopy endoscopy    . Dilation and curettage of uterus    . Esophageal manometry N/A 05/24/2015    Procedure: ESOPHAGEAL MANOMETRY (EM);  Surgeon: Elnita Maxwell, MD;  Location: [facility] ENDOSCOPY;  Service: Endoscopy;  Laterality: N/A;    Family History:  family history includes Breast cancer in her mother; CAD in her father.  Social History:  Social History   Social History  . Marital Status: Widowed    Spouse Name: N/A  . Number of Children: N/A  . Years of Education: N/A   Occupational History  . Not on file.   Social History Main Topics  . Smoking status: Never Smoker   . Smokeless tobacco: Never Used  . Alcohol Use: No  . Drug Use: No  . Sexual Activity: Not on file   Other Topics Concern  . Not on file   Social History Narrative    Home Medications:  Medications reconciled in EPIC  Current Facility-Administered Medications on File Prior to Visit  Medication Dose Route Frequency Provider Last Rate Last Dose  . acetaminophen (TYLENOL) tablet 650 mg  650 mg Oral Q4H PRN Ramonita Lab, MD      . allopurinol (ZYLOPRIM) tablet 100 mg  100 mg Oral BID Altamese Dilling, MD  100 mg at 11/30/15 1051  . apixaban (ELIQUIS) tablet 2.5 mg  2.5 mg Oral BID Altamese Dilling, MD   2.5 mg at 11/30/15 1051  . aspirin EC tablet 81 mg  81 mg Oral Daily Altamese Dilling, MD   81 mg at 11/30/15 1051  . atorvastatin (LIPITOR) tablet 10 mg  10 mg Oral q1800 Altamese Dilling, MD   10 mg at 11/29/15 1737  . aztreonam (AZACTAM) 500 mg in dextrose 5 % 50 mL IVPB  500 mg Intravenous Q8H Altamese Dilling, MD   500 mg at 11/30/15 1445  . cholecalciferol (VITAMIN D) tablet 1,000 Units  1,000 Units Oral Daily Altamese Dilling, MD   1,000 Units at 11/30/15 1051  . diltiazem (CARDIZEM CD) 24 hr capsule 240 mg  240 mg Oral Daily  Altamese Dilling, MD   240 mg at 11/30/15 1051  . ferrous sulfate tablet 325 mg  325 mg Oral Q breakfast Altamese Dilling, MD   325 mg at 11/30/15 1051  . gabapentin (NEURONTIN) capsule 600 mg  600 mg Oral TID Altamese Dilling, MD   600 mg at 11/30/15 1618  . hydroxychloroquine (PLAQUENIL) tablet 200 mg  200 mg Oral Daily Altamese Dilling, MD   200 mg at 11/30/15 1051  . insulin aspart (novoLOG) injection 0-9 Units  0-9 Units Subcutaneous TID WC Altamese Dilling, MD   1 Units at 11/30/15 1247  . lactulose (CHRONULAC) 10 GM/15ML solution 20 g  20 g Oral BID Altamese Dilling, MD   20 g at 11/29/15 2136  . metoprolol tartrate (LOPRESSOR) tablet 12.5 mg  12.5 mg Oral BID Altamese Dilling, MD   12.5 mg at 11/30/15 1052  . nitroGLYCERIN (NITROSTAT) SL tablet 0.4 mg  0.4 mg Sublingual Q5 min PRN Debby Crosley, MD   0.4 mg at 11/30/15 0103  . omega-3 acid ethyl esters (LOVAZA) capsule 1 g  1 g Oral BID Altamese Dilling, MD   1 g at 11/30/15 1051  . pantoprazole (PROTONIX) EC tablet 40 mg  40 mg Oral Daily Altamese Dilling, MD   40 mg at 11/30/15 1051  . rOPINIRole (REQUIP) tablet 0.5 mg  0.5 mg Oral QHS Altamese Dilling, MD   0.5 mg at 11/29/15 2136  . traMADol (ULTRAM) tablet 50 mg  50 mg Oral Q8H PRN Altamese Dilling, MD   50 mg at 11/29/15 2136  . vitamin B-12 (CYANOCOBALAMIN) tablet 1,000 mcg  1,000 mcg Oral Daily Altamese Dilling, MD   1,000 mcg at 11/30/15 1051  . vitamin C (ASCORBIC ACID) tablet 500 mg  500 mg Oral Daily Altamese Dilling, MD   500 mg at 11/30/15 1051   Current Outpatient Prescriptions on File Prior to Visit  Medication Sig Dispense Refill  . allopurinol (ZYLOPRIM) 100 MG tablet Take 100 mg by mouth 2 (two) times daily.     Marland Kitchen apixaban (ELIQUIS) 2.5 MG TABS tablet Take 1 tablet (2.5 mg total) by mouth 2 (two) times daily. 60 tablet 1  . aspirin EC 81 MG tablet Take 81 mg by mouth every evening.     . cholecalciferol  (VITAMIN D) 1000 units tablet Take 1,000 Units by mouth daily.    Marland Kitchen diltiazem (CARDIZEM CD) 240 MG 24 hr capsule Take 1 capsule (240 mg total) by mouth daily. 30 capsule 0  . ferrous sulfate 325 (65 FE) MG tablet Take 325 mg by mouth daily with breakfast.    . gabapentin (NEURONTIN) 600 MG tablet Take 600 mg by mouth 3 (three) times daily.    Marland Kitchen  glipiZIDE (GLUCOTROL) 5 MG tablet Take 5 mg by mouth 2 (two) times daily before a meal.     . glucosamine-chondroitin 500-400 MG tablet Take 1 tablet by mouth daily.    . hydrochlorothiazide (HYDRODIURIL) 25 MG tablet Take 25 mg by mouth daily.     . hydroxychloroquine (PLAQUENIL) 200 MG tablet Take 200 mg by mouth daily.    Marland Kitchen lactulose (CHRONULAC) 10 GM/15ML solution Take 20 g by mouth 2 (two) times daily.     . metoprolol tartrate (LOPRESSOR) 25 MG tablet Take 0.5 tablets (12.5 mg total) by mouth 2 (two) times daily. 60 tablet 0  . mupirocin cream (BACTROBAN) 2 % Apply 1 application topically at bedtime as needed (for ulcers on feet).     Marland Kitchen omega-3 acid ethyl esters (LOVAZA) 1 g capsule Take 1 g by mouth 2 (two) times daily.    . pantoprazole (PROTONIX) 40 MG tablet Take 40 mg by mouth daily.     Marland Kitchen rOPINIRole (REQUIP) 0.5 MG tablet Take 0.5 mg by mouth at bedtime.    . sulfamethoxazole-trimethoprim (BACTRIM DS,SEPTRA DS) 800-160 MG tablet Take 1 tablet by mouth daily. 14 tablet 0  . traMADol (ULTRAM) 50 MG tablet Take 50 mg by mouth every 8 (eight) hours as needed for moderate pain.     . Turmeric 500 MG TABS Take 500 mg by mouth daily.    . vitamin B-12 (CYANOCOBALAMIN) 1000 MCG tablet Take 1,000 mcg by mouth daily.    . vitamin C (ASCORBIC ACID) 500 MG tablet Take 500 mg by mouth daily.      Allergies:  Allergies  Allergen Reactions  . Augmentin [Amoxicillin-Pot Clavulanate] Anaphylaxis and Other (See Comments)    Has patient had a PCN reaction causing immediate rash, facial/tongue/throat swelling, SOB or lightheadedness with hypotension: Yes Has  patient had a PCN reaction causing severe rash involving mucus membranes or skin necrosis: No Has patient had a PCN reaction that required hospitalization No Has patient had a PCN reaction occurring within the last 10 years: Yes If all of the above answers are "NO", then may proceed with Cephalosporin use.  . Beta Adrenergic Blockers Other (See Comments)    Reaction:  Unknown  . Clindamycin Other (See Comments)    Reaction:  Redness to face/swollen eyes   . Excedrin Extra Strength [Aspirin-Acetaminophen-Caffeine] Diarrhea  . Keflex [Cephalexin] Swelling and Other (See Comments)    Reaction:  Lip swelling   . Meloxicam Other (See Comments)    Reaction:  Decreased renal function  . Methotrexate Sodium Other (See Comments)    Reaction:  Decreased renal function  . Nsaids Other (See Comments)    Reaction:  Unknown   . Quinine Rash  . Quinolones Rash    Physical Exam:  BP: 145/59-170/59 P:83 R: 20 Pulse ox: 94-100   General Appearance:  Well developed, well nourished, no acute distress, alert and oriented x3 HEENT:  Normocephalic atraumatic, moist mucous membranes Skin:  normal coloration and turgor, no rashes, warm and dry Pelvic:  NEFG, no vulvar masses or lesions, decreased  vaginal mucosa, no vaginal bleeding, scant mucoid dc at the introital opening, Unable to visualize the cx as no exam room used and placed on upside inverted bedpan to insert speculum, No uterine masses palp, no adnexal masses appreciated,   no pelvic organ prolapse, wet prep obtained.    Labs/Studies:   CBC and Coags:  Lab Results  Component Value Date   WBC 15.5* 11/30/2015   NEUTOPHILPCT 80 11/21/2015  EOSPCT 1 11/21/2015   BASOPCT 1 11/21/2015   LYMPHOPCT 7 11/21/2015   HGB 9.0* 11/30/2015   HCT 27.2* 11/30/2015   MCV 101.6* 11/30/2015   PLT 429 11/30/2015   INR 1.03 11/21/2015   CMP:  Lab Results  Component Value Date   NA 139 11/29/2015   K 3.4* 11/29/2015   CL 111 11/29/2015   CO2 21*  11/29/2015   BUN 50* 11/29/2015   CREATININE 2.90* 11/29/2015   CREATININE 3.28* 11/28/2015   CREATININE 1.84* 11/26/2015   PROT 6.1* 11/28/2015   BILITOT 0.6 11/28/2015   BILIDIR 0.2 11/28/2015   ALT 24 11/28/2015   AST 26 11/28/2015   ALKPHOS 93 11/28/2015   Other Labs: Wet Prep: neg Clue, Neg Trich, neg Hyphae  Other Imaging: Dg Chest 1 View  11/28/2015  CLINICAL DATA:  Chest pain today. EXAM: CHEST 1 VIEW COMPARISON:  11/22/2015 FINDINGS: The heart is enlarged but stable. There is tortuosity and calcification of the thoracic aorta. The lungs demonstrate mild chronic bronchitic changes and streaky atelectasis but no definite infiltrates or effusions. The bony thorax is intact. IMPRESSION: 1. Chronic bronchitic changes and streaky areas of subsegmental atelectasis but no definite infiltrates or effusions. 2. Stable mild cardiac enlargement. 3. Stable aortic atherosclerosis. Electronically Signed   By: Rudie Meyer M.D.   On: 11/28/2015 15:02   Dg Chest 1 View  11/22/2015  CLINICAL DATA:  Acute onset of generalized weakness. Code sepsis. Initial encounter. EXAM: CHEST 1 VIEW COMPARISON:  Chest radiograph performed 11/21/2015 FINDINGS: The lungs are well-aerated. Mild bibasilar airspace opacities may reflect mild pneumonia. There is no evidence of pleural effusion or pneumothorax. The cardiomediastinal silhouette is enlarged. No acute osseous abnormalities are seen. IMPRESSION: 1. Mild bibasilar opacities may reflect mild pneumonia. 2. Cardiomegaly. Electronically Signed   By: Roanna Raider M.D.   On: 11/22/2015 18:32   Ct Head Wo Contrast  11/30/2015  CLINICAL DATA:  Bilateral upper extremity weakness right worse than left. EXAM: CT HEAD WITHOUT CONTRAST TECHNIQUE: Contiguous axial images were obtained from the base of the skull through the vertex without intravenous contrast. COMPARISON:  07/25/2006 FINDINGS: The ventricles, cisterns and other CSF spaces are within normal. There is  mild-to-moderate chronic ischemic microvascular disease. No evidence of mass, mass effect, shift of midline structures or acute hemorrhage. No evidence of acute infarction. Remaining bones and soft tissues are within normal. IMPRESSION: No acute intracranial findings. Chronic ischemic microvascular disease. Electronically Signed   By: Elberta Fortis M.D.   On: 11/30/2015 16:52   Dg Chest Port 1 View  11/21/2015  CLINICAL DATA:  Family states pt ate some ice cream tonight then went to the bathroom and became weak with uncontrolled shaking. New onset of Afib. History of HTN, stroke, diabetic EXAM: PORTABLE CHEST 1 VIEW COMPARISON:  01/11/2014 FINDINGS: Cardiac silhouette is mildly enlarged. No mediastinal or hilar masses or evidence of adenopathy. Clear lungs. No pleural effusion or pneumothorax. The bony thorax is demineralized but grossly intact. IMPRESSION: No acute cardiopulmonary disease. Electronically Signed   By: Amie Portland M.D.   On: 11/21/2015 20:05   Ct Renal Stone Study  11/28/2015  CLINICAL DATA:  Worsening weakness and lethargy status post treated urinary sepsis. Abdominal pain. Difficulty urinating. EXAM: CT ABDOMEN AND PELVIS WITHOUT CONTRAST TECHNIQUE: Multidetector CT imaging of the abdomen and pelvis was performed following the standard protocol without IV contrast. COMPARISON:  Renal ultrasound 01/12/2014 FINDINGS: Lower chest: Evaluation of lung parenchyma is precluded by breathing motion artifact.  There are bilateral small pleural effusions with associated compressive bibasilar atelectasis. Mitral and aortic valve annular calcifications. Hepatobiliary: No mass visualized on this un-enhanced exam. Postcholecystectomy. Pancreas: No mass or inflammatory process identified on this un-enhanced exam. Spleen: Within normal limits in size. Adrenals/Urinary Tract: No evidence of urolithiasis or hydronephrosis. No definite mass visualized on this un-enhanced exam. Mildly atrophic kidneys.  Stomach/Bowel: No evidence of obstruction, inflammatory process, or abnormal fluid collections. Vascular/Lymphatic: No pathologically enlarged lymph nodes. No evidence of abdominal aortic aneurysm. Mild diffuse mesenteric stranding. Advanced calcific atherosclerotic disease of the aorta and its main attributes. Reproductive: No mass or other significant abnormality. The urinary bladder is not well evaluated as it is decompressed around urinary Foley. Other: Chest wall edema. Musculoskeletal: Reversed S shaped thoraco lumbar spine scoliosis with advanced osteoarthritic changes. No suspicious osseous lesions. IMPRESSION: Bilateral pleural effusions with bibasilar hypoventilatory changes. No evidence of acute abnormality within the solid abdominal organs. Somewhat atrophic kidneys. Diffuse mesenteric stranding and chest and abdominal wall edema, likely due to third spacing. Advanced calcific atherosclerotic disease of the aorta. Mitral and aortic valve annular calcifications. Electronically Signed   By: Ted Mcalpine M.D.   On: 11/28/2015 16:04     Assessment / Plan:   LISA MILIAN is a 82 y.o. No obstetric history on file. who presents with vag dc today and consult placed for [facility] to do a pelvic and evaluate pt.   1. New onset of At Fib 2. Vaginal dc neg for BV, Trich, yeast GC/CH pending   Thank you for the opportunity to be involved with this pt's care.  Sharee Pimple, MSN, CNM, FNP

## 2015-11-30 NOTE — Progress Notes (Signed)
Called Dr Joneen Roach with lab- troponin 0.08.  Pt complains intermittently of chest pain at 2/10. Some nausea.  Pt awake, dtr at bedside.  Pt states "I think I'm going to be okay".  Order for troponin at 9 AM today. Henriette Combs RN

## 2015-11-30 NOTE — Progress Notes (Signed)
[facility] [place] at [facility]   PATIENT NAME: Kelsey Chung    MR#:  [id number]  DATE OF BIRTH:  Sep 07, 1923  SUBJECTIVE:  CHIEF COMPLAINT:  Patient is Reporting bilateral upper extremity weakness, more prominent on the right side unable to hold a fork and dropping things from the hand with poor grip. Denies any headache or blurry vision Reporting urinary frequency  REVIEW OF SYSTEMS:  CONSTITUTIONAL: No fever, fatigue or weakness.  EYES: No blurred or double vision.  EARS, NOSE, AND THROAT: No tinnitus or ear pain.  RESPIRATORY: No cough, shortness of breath, wheezing or hemoptysis.  CARDIOVASCULAR: No chest pain, orthopnea, edema.  GASTROINTESTINAL: No nausea, vomiting, diarrhea or abdominal pain.  GENITOURINARY: No dysuria, hematuria.  ENDOCRINE: No polyuria, nocturia,  HEMATOLOGY: No anemia, easy bruising or bleeding SKIN: No rash or lesion. MUSCULOSKELETAL: No joint pain or arthritis.   NEUROLOGIC: Reporting bilateral upper extremity weakness with poor grip .No tingling, numbness PSYCHIATRY: No anxiety or depression.   DRUG ALLERGIES:   Allergies  Allergen Reactions  . Augmentin [Amoxicillin-Pot Clavulanate] Anaphylaxis and Other (See Comments)    Has patient had a PCN reaction causing immediate rash, facial/tongue/throat swelling, SOB or lightheadedness with hypotension: Yes Has patient had a PCN reaction causing severe rash involving mucus membranes or skin necrosis: No Has patient had a PCN reaction that required hospitalization No Has patient had a PCN reaction occurring within the last 10 years: Yes If all of the above answers are "NO", then may proceed with Cephalosporin use.  . Beta Adrenergic Blockers Other (See Comments)    Reaction:  Unknown  . Clindamycin Other (See Comments)    Reaction:  Redness to face/swollen eyes   . Excedrin Extra Strength [Aspirin-Acetaminophen-Caffeine] Diarrhea  . Keflex [Cephalexin] Swelling and Other  (See Comments)    Reaction:  Lip swelling   . Meloxicam Other (See Comments)    Reaction:  Decreased renal function  . Methotrexate Sodium Other (See Comments)    Reaction:  Decreased renal function  . Nsaids Other (See Comments)    Reaction:  Unknown   . Quinine Rash  . Quinolones Rash    VITALS:  Blood pressure 145/69, pulse 68, temperature 98 F (36.7 C), temperature source Oral, resp. rate 17, height 4\' 11"  (1.499 m), weight 79.833 kg (176 lb), SpO2 100 %.  PHYSICAL EXAMINATION:  GENERAL:  82 y.o.-year-old patient lying in the bed with no acute distress.  EYES: Pupils equal, round, reactive to light and accommodation. No scleral icterus. Extraocular muscles intact.  HEENT: Head atraumatic, normocephalic. Oropharynx and nasopharynx clear.  NECK:  Supple, no jugular venous distention. No thyroid enlargement, no tenderness.  LUNGS: Normal breath sounds bilaterally, no wheezing, rales,rhonchi or crepitation. No use of accessory muscles of respiration.  CARDIOVASCULAR: S1, S2 normal. No murmurs, rubs, or gallops.  ABDOMEN: Soft, nontender, nondistended. Bowel sounds present. No organomegaly or mass.  EXTREMITIES: No pedal edema, cyanosis, or clubbing.  NEUROLOGIC: Cranial nerves II through XII are intact. Muscle strength 5/5In bilateral lower extremities. Bilateral upper extremities right upper extremity motor 3 out of 5 left upper extremity motor 4 out of 5. Sensation intact. Gait not checked.  PSYCHIATRIC: The patient is alert and oriented x 3.  SKIN: No obvious rash, lesion, or ulcer.    LABORATORY PANEL:   CBC  Recent Labs Lab 11/30/15 0123  WBC 15.5*  HGB 9.0*  HCT 27.2*  PLT 429   ------------------------------------------------------------------------------------------------------------------  Chemistries   Recent Labs Lab 11/28/15  1423 11/29/15 0502  NA 137 139  K 3.7 3.4*  CL 109 111  CO2 15* 21*  GLUCOSE 141* 104*  BUN 52* 50*  CREATININE 3.28* 2.90*   CALCIUM 9.0 8.6*  AST 26  --   ALT 24  --   ALKPHOS 93  --   BILITOT 0.6  --    ------------------------------------------------------------------------------------------------------------------  Cardiac Enzymes  Recent Labs Lab 11/30/15 0954  TROPONINI 0.04*   ------------------------------------------------------------------------------------------------------------------  RADIOLOGY:  Dg Chest 1 View  11/28/2015  CLINICAL DATA:  Chest pain today. EXAM: CHEST 1 VIEW COMPARISON:  11/22/2015 FINDINGS: The heart is enlarged but stable. There is tortuosity and calcification of the thoracic aorta. The lungs demonstrate mild chronic bronchitic changes and streaky atelectasis but no definite infiltrates or effusions. The bony thorax is intact. IMPRESSION: 1. Chronic bronchitic changes and streaky areas of subsegmental atelectasis but no definite infiltrates or effusions. 2. Stable mild cardiac enlargement. 3. Stable aortic atherosclerosis. Electronically Signed   By: Rudie Meyer M.D.   On: 11/28/2015 15:02   Ct Renal Stone Study  11/28/2015  CLINICAL DATA:  Worsening weakness and lethargy status post treated urinary sepsis. Abdominal pain. Difficulty urinating. EXAM: CT ABDOMEN AND PELVIS WITHOUT CONTRAST TECHNIQUE: Multidetector CT imaging of the abdomen and pelvis was performed following the standard protocol without IV contrast. COMPARISON:  Renal ultrasound 01/12/2014 FINDINGS: Lower chest: Evaluation of lung parenchyma is precluded by breathing motion artifact. There are bilateral small pleural effusions with associated compressive bibasilar atelectasis. Mitral and aortic valve annular calcifications. Hepatobiliary: No mass visualized on this un-enhanced exam. Postcholecystectomy. Pancreas: No mass or inflammatory process identified on this un-enhanced exam. Spleen: Within normal limits in size. Adrenals/Urinary Tract: No evidence of urolithiasis or hydronephrosis. No definite mass  visualized on this un-enhanced exam. Mildly atrophic kidneys. Stomach/Bowel: No evidence of obstruction, inflammatory process, or abnormal fluid collections. Vascular/Lymphatic: No pathologically enlarged lymph nodes. No evidence of abdominal aortic aneurysm. Mild diffuse mesenteric stranding. Advanced calcific atherosclerotic disease of the aorta and its main attributes. Reproductive: No mass or other significant abnormality. The urinary bladder is not well evaluated as it is decompressed around urinary Foley. Other: Chest wall edema. Musculoskeletal: Reversed S shaped thoraco lumbar spine scoliosis with advanced osteoarthritic changes. No suspicious osseous lesions. IMPRESSION: Bilateral pleural effusions with bibasilar hypoventilatory changes. No evidence of acute abnormality within the solid abdominal organs. Somewhat atrophic kidneys. Diffuse mesenteric stranding and chest and abdominal wall edema, likely due to third spacing. Advanced calcific atherosclerotic disease of the aorta. Mitral and aortic valve annular calcifications. Electronically Signed   By: Ted Mcalpine M.D.   On: 11/28/2015 16:04    EKG:   Orders placed or performed during the hospital encounter of 11/28/15  . EKG 12-Lead  . EKG 12-Lead  . EKG 12-Lead  . EKG 12-Lead    ASSESSMENT AND PLAN:    * Acute cystitis recurrent-secondary to urinary retention Urology is recommending to continue Foley catheter for now and outpatien for trial of void follow-up after discharge  We'll continue IV aztreonam as she has multiple antibiotic allergies as recommended by infectious disease  Urine culture is sent, reveals no growth so far. Patient might not show any growth as she was treated with IV aztreonam recently and was discharged with by mouth Bactrim  a renal CT has revealed bilateral pleural effusions with bibasilar hypoventilatory changes no acute abnormalities , somewhat atrophic kidneys During previous admission patient was  treated with aztreonam and discharged home with by mouth Bactrim for  14 days per ID recommendations. Urine culture at that time has revealed less than 10,000 colonies insignificant growth  *Bilateral upper extremity weakness rule out acute CVA Continue aspirin and eliquis Will get neuro checks CT head is ordered. Neurology consult will be placed based on the CT findings and clinical findings   * Atrial fibrillation  On rate controlling medications and Eliquis  Recently seen cardiologist 4 days ago in previous admission.  *Chronic history  of esophageal spasms Outpatient follow-up with [facility] gastroenterology as recommended Continue current diet as tolerated  * Chronic renal failure  Stable, at baseline 2 days ago, waiting on result today.  * Hyperlipidemia  Continue statin.  * Arthritis  Continue home medications.  * Diabetes on insulin sliding scale coverage.    All the records are reviewed and case discussed with Care Management/Social Workerr. Management plans discussed with the patient, daughter and they are in agreement.  CODE STATUS: fc   TOTAL TIME TAKING CARE OF THIS PATIENT: 36  minutes.   POSSIBLE D/C IN 2  DAYS, DEPENDING ON CLINICAL CONDITION.  Note: This dictation was prepared with Dragon dictation along with smaller phrase technology. Any transcriptional errors that result from this process are unintentional.   Ramonita Lab M.D on 11/30/2015 at 1:59 PM  Between 7am to 6pm - Pager - [phone] After 6pm go to [email or web address] - password [email or web address] [facility]  [facility]  Office  [phone]  CC: Primary care physician; Barbette Reichmann, MD

## 2015-11-30 NOTE — Telephone Encounter (Signed)
-----   Message from Kelsey Laser, MD sent at 11/30/2015 12:42 PM EDT ----- Patient needs to be seen for hospital follow up and trial of void. Ok to schedule with Carollee Herter or any available physician .

## 2015-12-01 LAB — BASIC METABOLIC PANEL
ANION GAP: 5 (ref 5–15)
BUN: 32 mg/dL — AB (ref 6–20)
CHLORIDE: 109 mmol/L (ref 101–111)
CO2: 26 mmol/L (ref 22–32)
Calcium: 9.2 mg/dL (ref 8.9–10.3)
Creatinine, Ser: 1.62 mg/dL — ABNORMAL HIGH (ref 0.44–1.00)
GFR calc Af Amer: 31 mL/min — ABNORMAL LOW (ref >60)
GFR, EST NON AFRICAN AMERICAN: 27 mL/min — AB (ref >60)
Glucose, Bld: 146 mg/dL — ABNORMAL HIGH (ref 65–99)
POTASSIUM: 4.2 mmol/L (ref 3.5–5.1)
SODIUM: 140 mmol/L (ref 135–145)

## 2015-12-01 LAB — CBC
HEMATOCRIT: 28.9 % — AB (ref 35.0–47.0)
HEMOGLOBIN: 9.5 g/dL — AB (ref 12.0–16.0)
MCH: 33.3 pg (ref 26.0–34.0)
MCHC: 32.9 g/dL (ref 32.0–36.0)
MCV: 101.2 fL — AB (ref 80.0–100.0)
Platelets: 443 10*3/uL — ABNORMAL HIGH (ref 150–440)
RBC: 2.85 MIL/uL — ABNORMAL LOW (ref 3.80–5.20)
RDW: 19.8 % — ABNORMAL HIGH (ref 11.5–14.5)
WBC: 19 10*3/uL — AB (ref 3.6–11.0)

## 2015-12-01 LAB — GLUCOSE, CAPILLARY
GLUCOSE-CAPILLARY: 142 mg/dL — AB (ref 65–99)
GLUCOSE-CAPILLARY: 182 mg/dL — AB (ref 65–99)
Glucose-Capillary: 129 mg/dL — ABNORMAL HIGH (ref 65–99)
Glucose-Capillary: 240 mg/dL — ABNORMAL HIGH (ref 65–99)

## 2015-12-01 LAB — URINALYSIS COMPLETE WITH MICROSCOPIC (ARMC ONLY)
BILIRUBIN URINE: NEGATIVE
Glucose, UA: NEGATIVE mg/dL
KETONES UR: NEGATIVE mg/dL
Nitrite: NEGATIVE
PH: 5 (ref 5.0–8.0)
PROTEIN: 30 mg/dL — AB
Specific Gravity, Urine: 1.02 (ref 1.005–1.030)

## 2015-12-01 MED ORDER — LORAZEPAM 2 MG/ML IJ SOLN
0.5000 mg | Freq: Once | INTRAMUSCULAR | Status: AC
  Administered 2015-12-01: 0.5 mg via INTRAVENOUS
  Filled 2015-12-01: qty 1

## 2015-12-01 NOTE — Progress Notes (Signed)
CH provided pastoral care.

## 2015-12-01 NOTE — Evaluation (Signed)
Physical Therapy Evaluation Patient Details Name: Kelsey Chung MRN: [id number] DOB: [birth year] Today's Date: 12/01/2015   History of Present Illness  Pt with complaints of b/l UE weakness, AMS.  She was here last week with UTI. Pt with history of DM, HTN, GERD, OA, and CVA.   Clinical Impression  Pt shows good effort with PT and was able to walk ~40 ft, or as far as she typically does in the home to get the bathroom.  Daughter remains somewhat concerned about pt at home (and was contemplating the need for rehab) but given pt's desire to go home and not being too far from her limited baseline we decided HHPT is likely the more appropriate choice.  Pt was safe with getting to standing and limited walking, but has very slow, stooped posture and ultimately is limited with her functional level.  She will benefit from HHPT and near 24/7 assist.      Follow Up Recommendations SNF;Home health PT (daughter states pt doesn't want STR, can assist nearly 24/7)    Equipment Recommendations       Recommendations for Other Services       Precautions / Restrictions Precautions Precautions: Fall Restrictions Weight Bearing Restrictions: No      Mobility  Bed Mobility               General bed mobility comments: Pt in recliner on arrival, apparently she needed some assist to get bed to chair with nursing  Transfers Overall transfer level: Needs assistance Equipment used: Rolling walker (2 wheeled) Transfers: Sit to/from Stand Sit to Stand: Min assist         General transfer comment: More reminders for hand placement and set up, pt intially had some issues getting her torso forward, but did eventually shift weight forward (she has signficant baseline kyphotic/stooped posture)  Ambulation/Gait Ambulation/Gait assistance: Min guard Ambulation Distance (Feet): 40 Feet Assistive device: Rolling walker (2 wheeled)       General Gait Details: Pt with slow, guarded steps, but she  had no LOBs and generally displayed decent confidence.  Daughter reports that she is slower than baseline, but that she is not normally very fast or confident.  Stairs            Wheelchair Mobility    Modified Rankin (Stroke Patients Only)       Balance                                             Pertinent Vitals/Pain Pain Assessment:  (only general chronic pain)    Home Living Family/patient expects to be discharged to:: Private residence Living Arrangements: Children Available Help at Discharge: Family Type of Home: House Home Access: Ramped entrance     Home Layout: One level Home Equipment: Gilmer Mor - quad;Walker - 4 wheels;Bedside commode;Shower seat - built in;Grab bars - tub/shower;Transport chair      Prior Function Level of Independence: Independent with assistive device(s)         Comments: daughter usually stays close, but until recently she was bathing on her own and able to go to the bathroom, etc     Hand Dominance        Extremity/Trunk Assessment   Upper Extremity Assessment: Generalized weakness (arthritic limitations t/o)           Lower Extremity Assessment: Generalized weakness (grossly functional,  but grossly 3+/5 only)      Cervical / Trunk Assessment: Kyphotic  Communication   Communication: HOH  Cognition Arousal/Alertness: Awake/alert Behavior During Therapy: WFL for tasks assessed/performed Overall Cognitive Status: Within Functional Limits for tasks assessed                      General Comments      Exercises        Assessment/Plan    PT Assessment Patient needs continued PT services  PT Diagnosis Difficulty walking;Generalized weakness   PT Problem List Decreased strength;Decreased activity tolerance;Decreased balance;Decreased knowledge of use of DME  PT Treatment Interventions DME instruction;Gait training;Therapeutic exercise   PT Goals (Current goals can be found in the Care  Plan section) Acute Rehab PT Goals Patient Stated Goal: to get stronger PT Goal Formulation: With patient/family Time For Goal Achievement: 12/15/15 Potential to Achieve Goals: Fair    Frequency Min 2X/week   Barriers to discharge        Co-evaluation               End of Session Equipment Utilized During Treatment: Gait belt Activity Tolerance: Patient tolerated treatment well;Patient limited by fatigue Patient left: in chair;with call bell/phone within reach;with family/visitor present Nurse Communication: Mobility status         Time: 1359-1430 PT Time Calculation (min) (ACUTE ONLY): 31 min   Charges:   PT Evaluation $PT Eval Low Complexity: 1 Procedure     PT G CodesMalachi Pro, DPT 12/01/2015, 2:55 PM

## 2015-12-01 NOTE — Progress Notes (Signed)
[facility] CLINIC INFECTIOUS DISEASE PROGRESS NOTE Date of Admission:  11/28/2015     ID: Kelsey Chung is a 82 y.o. female with recurrent UTI, urinary retention   Active Problems:   UTI (lower urinary tract infection)   UTI (urinary tract infection)  Subjective: No fevers Got up oob and walked some.   ROS  Eleven systems are reviewed and negative except per hpi  Medications:  Antibiotics Given (last 72 hours)    Date/Time Action Medication Dose Rate   11/28/15 2023 Given   aztreonam (AZACTAM) 1 g in dextrose 5 % 50 mL IVPB 1 g 100 mL/hr   11/29/15 0146 Given   aztreonam (AZACTAM) 500 mg in dextrose 5 % 50 mL IVPB 500 mg 100 mL/hr   11/29/15 1152 Given   hydroxychloroquine (PLAQUENIL) tablet 200 mg 200 mg    11/29/15 1424 Given   aztreonam (AZACTAM) 500 mg in dextrose 5 % 50 mL IVPB 500 mg 100 mL/hr   11/29/15 2215 Given   aztreonam (AZACTAM) 500 mg in dextrose 5 % 50 mL IVPB 500 mg 100 mL/hr   11/30/15 0509 Given   aztreonam (AZACTAM) 500 mg in dextrose 5 % 50 mL IVPB 500 mg 100 mL/hr   11/30/15 1051 Given   hydroxychloroquine (PLAQUENIL) tablet 200 mg 200 mg    11/30/15 1445 Given   aztreonam (AZACTAM) 500 mg in dextrose 5 % 50 mL IVPB 500 mg 100 mL/hr   11/30/15 2229 Given   aztreonam (AZACTAM) 500 mg in dextrose 5 % 50 mL IVPB 500 mg 100 mL/hr   12/01/15 0517 Given   aztreonam (AZACTAM) 500 mg in dextrose 5 % 50 mL IVPB 500 mg 100 mL/hr   12/01/15 0454 Given   hydroxychloroquine (PLAQUENIL) tablet 200 mg 200 mg      . allopurinol  100 mg Oral BID  . apixaban  2.5 mg Oral BID  . aspirin EC  81 mg Oral Daily  . atorvastatin  10 mg Oral q1800  . aztreonam  500 mg Intravenous Q8H  . cholecalciferol  1,000 Units Oral Daily  . diltiazem  240 mg Oral Daily  . ferrous sulfate  325 mg Oral Q breakfast  . gabapentin  600 mg Oral TID  . hydroxychloroquine  200 mg Oral Daily  . insulin aspart  0-9 Units Subcutaneous TID WC  . lactulose  20 g Oral BID  . metoprolol  tartrate  12.5 mg Oral BID  . omega-3 acid ethyl esters  1 g Oral BID  . pantoprazole  40 mg Oral Daily  . rOPINIRole  0.5 mg Oral QHS  . vitamin B-12  1,000 mcg Oral Daily  . vitamin C  500 mg Oral Daily    Objective: Vital signs in last 24 hours: Temp:  [98 F (36.7 C)-98.7 F (37.1 C)] 98.1 F (36.7 C) (06/23 1309) Pulse Rate:  [79-113] 101 (06/23 1309) Resp:  [18-26] 20 (06/23 1102) BP: (142-187)/(54-93) 157/57 mmHg (06/23 1309) SpO2:  [88 %-98 %] 96 % (06/23 1309) Constitutional: frail,sitting in chair  HENT: [place]/AT, PERRLA, no scleral icterus Mouth/Throat: Oropharynx is clear and moist. No oropharyngeal exudate.  Cardiovascular: Normal rate, regular rhythm and normal heart sounds.  Pulmonary/Chest: Effort normal and breath sounds normal. Abdominal: Soft. Bowel sounds are normal. exhibits no distension. There is no tenderness.  Lymphadenopathy: no cervical adenopathy. No axillary adenopathy Neurological: alert and oriented to person, place, and time.  Skin: Skin is warm and dry. No rash noted. No erythema.  Psychiatric:  a normal mood and affect. behavior is normal.  Foley in place Lab Results  Recent Labs  11/29/15 0502 11/30/15 0123 12/01/15 0533  WBC 13.6* 15.5* 19.0*  HGB 8.5* 9.0* 9.5*  HCT 25.7* 27.2* 28.9*  NA 139  --  140  K 3.4*  --  4.2  CL 111  --  109  CO2 21*  --  26  BUN 50*  --  32*  CREATININE 2.90*  --  1.62*    Microbiology: Results for orders placed or performed during the hospital encounter of 11/28/15  Urine culture     Status: None   Collection Time: 11/28/15  2:23 PM  Result Value Ref Range Status   Specimen Description URINE, RANDOM  Final   Special Requests NONE  Final   Culture NO GROWTH Performed at [facility]   Final   Report Status 11/29/2015 FINAL  Final  Blood culture (routine x 2)     Status: None (Preliminary result)   Collection Time: 11/28/15  2:45 PM  Result Value Ref Range Status   Specimen Description  BLOOD LEFT FA  Final   Special Requests   Final    BOTTLES DRAWN AEROBIC AND ANAEROBIC AER ANA   Culture NO GROWTH 3 DAYS  Final   Report Status PENDING  Incomplete  Blood culture (routine x 2)     Status: None (Preliminary result)   Collection Time: 11/28/15  2:50 PM  Result Value Ref Range Status   Specimen Description BLOOD RIGHT AC  Final   Special Requests   Final    BOTTLES DRAWN AEROBIC AND ANAEROBIC AER ANA   Culture NO GROWTH 3 DAYS  Final   Report Status PENDING  Incomplete  Wet prep, genital     Status: Abnormal   Collection Time: 11/30/15  5:29 PM  Result Value Ref Range Status   Yeast Wet Prep HPF POC NONE SEEN NONE SEEN Final   Trich, Wet Prep NONE SEEN NONE SEEN Final   Clue Cells Wet Prep HPF POC NONE SEEN NONE SEEN Final   WBC, Wet Prep HPF POC MANY (A) NONE SEEN Final   Sperm NONE SEEN  Final    Studies/Results: Ct Head Wo Contrast  11/30/2015  CLINICAL DATA:  Bilateral upper extremity weakness right worse than left. EXAM: CT HEAD WITHOUT CONTRAST TECHNIQUE: Contiguous axial images were obtained from the base of the skull through the vertex without intravenous contrast. COMPARISON:  07/25/2006 FINDINGS: The ventricles, cisterns and other CSF spaces are within normal. There is mild-to-moderate chronic ischemic microvascular disease. No evidence of mass, mass effect, shift of midline structures or acute hemorrhage. No evidence of acute infarction. Remaining bones and soft tissues are within normal. IMPRESSION: No acute intracranial findings. Chronic ischemic microvascular disease. Electronically Signed   By: Elberta Fortis M.D.   On: 11/30/2015 16:52    Assessment/Plan: Kelsey Chung is a 82 y.o. femalewith recurrent UTIs UA on admission with TNTC wbc. Several prior cxs negative. CT renal protocol with no stones or hydronephrosis Seems to have urinary retention as etiology of recurrent infections given the finding of 500 cc urine on foley  placement. Culture negative again. WBC is elevated still.  Urology rec continue foley in place.  Recommendations Continue aztreoman until discharge Would like to see WBC decreasing prior to DC and renal function at baseline At Dc would restart bactrim DS BID for 7 days  and also give fosfomycin 3 gm po q3  days for 3 doses since her cultures have been negative and we have no cultures to guide [place] She is allergic to betalactams and quinolones FU urology as otpt to remove foley. Would prob benefit from cysto if still with inflammation on UA Thank you very much for the consult. Will follow with you.  Harless Molinari P   12/01/2015, 2:54 PM

## 2015-12-01 NOTE — Care Management (Addendum)
Admitted to [facility] with the diagnosis of urinary tract infection. Discharged from this facility 11/26/15. Lives with daughter, Dimple Nanas, [phone]).  Office visit with Dr. Marcello Fennel 11/28/15. Rolling walker, bedside commode and a  quad cane are in the home. No Home oxygen. Followed by [facility]. Foley intact. Physical therapy evaluation completed. Recommends home with home health/physical therapy. Dr. Amado Coe updated.  Gwenette Greet RN MSN CCM Care Management [phone]

## 2015-12-01 NOTE — Progress Notes (Signed)
[facility] - [place] at [facility]   PATIENT NAME: Kelsey Chung    MR#:  [id number]  DATE OF BIRTH:  20-Nov-1923  SUBJECTIVE:  CHIEF COMPLAINT:  Patients agitated last night and sleepy today am  But arousable  REVIEW OF SYSTEMS:  CONSTITUTIONAL: No fever, fatigue or weakness.  EYES: No blurred or double vision.  EARS, NOSE, AND THROAT: No tinnitus or ear pain.  RESPIRATORY: No cough, shortness of breath, wheezing or hemoptysis.  CARDIOVASCULAR: No chest pain, orthopnea, edema.  GASTROINTESTINAL: No nausea, vomiting, diarrhea or abdominal pain.  GENITOURINARY: No dysuria, hematuria.  ENDOCRINE: No polyuria, nocturia,  HEMATOLOGY: No anemia, easy bruising or bleeding SKIN: No rash or lesion. MUSCULOSKELETAL: No joint pain or arthritis.   NEUROLOGIC: Reporting bilateral upper extremity weakness with poor grip .No tingling, numbness PSYCHIATRY: No anxiety or depression.   DRUG ALLERGIES:   Allergies  Allergen Reactions  . Augmentin [Amoxicillin-Pot Clavulanate] Anaphylaxis and Other (See Comments)    Has patient had a PCN reaction causing immediate rash, facial/tongue/throat swelling, SOB or lightheadedness with hypotension: Yes Has patient had a PCN reaction causing severe rash involving mucus membranes or skin necrosis: No Has patient had a PCN reaction that required hospitalization No Has patient had a PCN reaction occurring within the last 10 years: Yes If all of the above answers are "NO", then may proceed with Cephalosporin use.  . Beta Adrenergic Blockers Other (See Comments)    Reaction:  Unknown  . Clindamycin Other (See Comments)    Reaction:  Redness to face/swollen eyes   . Excedrin Extra Strength [Aspirin-Acetaminophen-Caffeine] Diarrhea  . Keflex [Cephalexin] Swelling and Other (See Comments)    Reaction:  Lip swelling   . Meloxicam Other (See Comments)    Reaction:  Decreased renal function  . Methotrexate Sodium Other (See Comments)     Reaction:  Decreased renal function  . Nsaids Other (See Comments)    Reaction:  Unknown   . Quinine Rash  . Quinolones Rash    VITALS:  Blood pressure 149/71, pulse 96, temperature 98.3 F (36.8 C), temperature source Oral, resp. rate 18, height 4\' 11"  (1.499 m), weight 79.833 kg (176 lb), SpO2 95 %.  PHYSICAL EXAMINATION:  GENERAL:  82 y.o.-year-old patient lying in the bed with no acute distress.  EYES: Pupils equal, round, reactive to light and accommodation. No scleral icterus. Extraocular muscles intact.  HEENT: Head atraumatic, normocephalic. Oropharynx and nasopharynx clear.  NECK:  Supple, no jugular venous distention. No thyroid enlargement, no tenderness.  LUNGS: Normal breath sounds bilaterally, no wheezing, rales,rhonchi or crepitation. No use of accessory muscles of respiration.  CARDIOVASCULAR: S1, S2 normal. No murmurs, rubs, or gallops.  ABDOMEN: Soft, nontender, nondistended. Bowel sounds present. No organomegaly or mass.  EXTREMITIES: No pedal edema, cyanosis, or clubbing.  NEUROLOGIC: Cranial nerves II through XII are intact. Muscle strength 5/5In bilateral lower extremities. Bilateral upper extremities 5/5. Sensation intact. Gait not checked.  PSYCHIATRIC: The patient is alert and oriented x 3.  SKIN: No obvious rash, lesion, or ulcer.    LABORATORY PANEL:   CBC  Recent Labs Lab 12/01/15 0533  WBC 19.0*  HGB 9.5*  HCT 28.9*  PLT 443*   ------------------------------------------------------------------------------------------------------------------  Chemistries   Recent Labs Lab 11/28/15 1423  12/01/15 0533  NA 137  < > 140  K 3.7  < > 4.2  CL 109  < > 109  CO2 15*  < > 26  GLUCOSE 141*  < > 146*  BUN 52*  < > 32*  CREATININE 3.28*  < > 1.62*  CALCIUM 9.0  < > 9.2  AST 26  --   --   ALT 24  --   --   ALKPHOS 93  --   --   BILITOT 0.6  --   --   < > = values in this interval not  displayed. ------------------------------------------------------------------------------------------------------------------  Cardiac Enzymes  Recent Labs Lab 11/30/15 0954  TROPONINI 0.04*   ------------------------------------------------------------------------------------------------------------------  RADIOLOGY:  Ct Head Wo Contrast  11/30/2015  CLINICAL DATA:  Bilateral upper extremity weakness right worse than left. EXAM: CT HEAD WITHOUT CONTRAST TECHNIQUE: Contiguous axial images were obtained from the base of the skull through the vertex without intravenous contrast. COMPARISON:  07/25/2006 FINDINGS: The ventricles, cisterns and other CSF spaces are within normal. There is mild-to-moderate chronic ischemic microvascular disease. No evidence of mass, mass effect, shift of midline structures or acute hemorrhage. No evidence of acute infarction. Remaining bones and soft tissues are within normal. IMPRESSION: No acute intracranial findings. Chronic ischemic microvascular disease. Electronically Signed   By: Elberta Fortis M.D.   On: 11/30/2015 16:52    EKG:   Orders placed or performed during the hospital encounter of 11/28/15  . EKG 12-Lead  . EKG 12-Lead  . EKG 12-Lead  . EKG 12-Lead    ASSESSMENT AND PLAN:    * Acute cystitis recurrent-secondary to urinary retention Continue aztreonam until discharge,as wbc is trending up ID is recommending to see WBC decreasing prior to DC and renal function at baseline At the time of discharge recommending to  restart bactrim DS BID for 7 days and also give fosfomycin 3 gm po q3 days for 3 doses since her cultures have been negative and we have no cultures to guide [place] She is allergic to betalactams and quinolones FU urology as otpt to remove foley. Would prob benefit from cystoscopy if still with inflammation on UA Urology is recommending to continue Foley catheter for now and outpatien for trial of void follow-up after discharge During  previous admission patient was treated with aztreonam and discharged home with by mouth Bactrim for 14 days per ID recommendations. Urine culture at that time has revealed less than 10,000 colonies insignificant growth  *Bilateral upper extremity weakness rule out acute CVA Continue aspirin and eliquis CT head is negNeurology consult will be placed     * Atrial fibrillation  On rate controlling medications and Eliquis  Recently seen cardiologist 4 days ago in previous admission.  *Chronic history  of esophageal spasms Outpatient follow-up with [facility] gastroenterology as recommended Continue current diet as tolerated  * Chronic renal failure  Stable, at baseline 2 days ago, waiting on result today.  * Hyperlipidemia  Continue statin.  * Arthritis  Continue home medications.  * Diabetes on insulin sliding scale coverage.    All the records are reviewed and case discussed with Care Management/Social Workerr. Management plans discussed with the patient, daughter and they are in agreement.  CODE STATUS: fc   TOTAL TIME TAKING CARE OF THIS PATIENT: 36  minutes.   POSSIBLE D/C IN 2  DAYS, DEPENDING ON CLINICAL CONDITION.  Note: This dictation was prepared with Dragon dictation along with smaller phrase technology. Any transcriptional errors that result from this process are unintentional.   Ramonita Lab M.D on 12/01/2015 at 9:56 PM  Between 7am to 6pm - Pager - [phone] After 6pm go to [email or web address] - password [email or web address] [facility]  Office  [phone]  CC: Primary care physician; Barbette Reichmann, MD

## 2015-12-01 NOTE — Telephone Encounter (Signed)
Patient will need a void and trial and can be on the nurse schd per dr. Apolinar Junes she doe snot need to be on as a new patient since dr. Sherryl Barters saw her and est care at [facility].  Her daughter will call back once she know's when she will be released from [facility].   Marcelino Duster

## 2015-12-01 NOTE — Progress Notes (Signed)
PT Cancellation Note  Patient Details Name: Kelsey Chung MRN: [id number] DOB: Jul 28, 1923   Cancelled Treatment:    Reason Eval/Treat Not Completed: Patient's level of consciousness. PT made attempt to see patient. Patient is in the recliner with daughter in the room. Patient is fast asleep, PT made attempt to squeeze calf, hand, and sternal rub. Patient did not become more alert, RN came in room with CNA as PT was exiting. PT will re-attempt as patient is able and appropriate.  Kerin Ransom, PT, DPT    12/01/2015, 10:36 AM

## 2015-12-01 NOTE — Progress Notes (Signed)
Pharmacy Antibiotic Note  Kelsey Chung is a 82 y.o. female admitted on 11/28/2015 with UTI.  Pharmacy has been consulted for Aztreonam dosing. (Pt just d/c from [facility] 6/18 on Septra po) Patient w/ allergy to PCN, Cephlasporins, Quinolones, Clindamycin.  Plan: Patient received Aztreonam 1 gram IV x 1. The dose of Aztreonam will be adjusted to 500mg  IV q8h based on renal function. for Crcl 10-30 ml/min    Height: 4\' 11"  (149.9 cm) Weight: 176 lb (79.833 kg) IBW/kg (Calculated) : 43.2  Temp (24hrs), Avg:98.4 F (36.9 C), Min:98 F (36.7 C), Max:98.7 F (37.1 C)   Recent Labs Lab 11/26/15 0545 11/28/15 1423 11/29/15 0502 11/30/15 0123 12/01/15 0533  WBC  --  13.0* 13.6* 15.5* 19.0*  CREATININE 1.84* 3.28* 2.90*  --  1.62*    Estimated Creatinine Clearance: 21.5 mL/min (by C-G formula based on Cr of 1.62).    Allergies  Allergen Reactions  . Augmentin [Amoxicillin-Pot Clavulanate] Anaphylaxis and Other (See Comments)    Has patient had a PCN reaction causing immediate rash, facial/tongue/throat swelling, SOB or lightheadedness with hypotension: Yes Has patient had a PCN reaction causing severe rash involving mucus membranes or skin necrosis: No Has patient had a PCN reaction that required hospitalization No Has patient had a PCN reaction occurring within the last 10 years: Yes If all of the above answers are "NO", then may proceed with Cephalosporin use.  . Beta Adrenergic Blockers Other (See Comments)    Reaction:  Unknown  . Clindamycin Other (See Comments)    Reaction:  Redness to face/swollen eyes   . Excedrin Extra Strength [Aspirin-Acetaminophen-Caffeine] Diarrhea  . Keflex [Cephalexin] Swelling and Other (See Comments)    Reaction:  Lip swelling   . Meloxicam Other (See Comments)    Reaction:  Decreased renal function  . Methotrexate Sodium Other (See Comments)    Reaction:  Decreased renal function  . Nsaids Other (See Comments)    Reaction:  Unknown   .  Quinine Rash  . Quinolones Rash    Antimicrobials this admission: Vanc x 1 in ER 6/20 Aztreonam 6/20 >>       >>    Dose adjustments this admission:    Microbiology results: 6/20 BCx: P 6/20 UCx: P    Sputum:      MRSA PCR:    Thank you for allowing pharmacy to be a part of this patient's care.  Luv Mish D 12/01/2015 9:49 AM

## 2015-12-02 LAB — CBC WITH DIFFERENTIAL/PLATELET
BASOS ABS: 0.2 10*3/uL — AB (ref 0–0.1)
Basophils Relative: 1 %
EOS ABS: 0.5 10*3/uL (ref 0–0.7)
Eosinophils Relative: 3 %
HCT: 28.6 % — ABNORMAL LOW (ref 35.0–47.0)
Hemoglobin: 9.5 g/dL — ABNORMAL LOW (ref 12.0–16.0)
LYMPHS ABS: 1 10*3/uL (ref 1.0–3.6)
Lymphocytes Relative: 6 %
MCH: 33.5 pg (ref 26.0–34.0)
MCHC: 33.1 g/dL (ref 32.0–36.0)
MCV: 101.3 fL — ABNORMAL HIGH (ref 80.0–100.0)
MONO ABS: 1.5 10*3/uL — AB (ref 0.2–0.9)
Monocytes Relative: 9 %
Neutro Abs: 13.1 10*3/uL — ABNORMAL HIGH (ref 1.4–6.5)
Neutrophils Relative %: 81 %
PLATELETS: 418 10*3/uL (ref 150–440)
RBC: 2.83 MIL/uL — AB (ref 3.80–5.20)
RDW: 19.8 % — AB (ref 11.5–14.5)
WBC: 16.3 10*3/uL — AB (ref 3.6–11.0)

## 2015-12-02 LAB — CREATININE, SERUM
CREATININE: 1.25 mg/dL — AB (ref 0.44–1.00)
GFR, EST AFRICAN AMERICAN: 43 mL/min — AB (ref >60)
GFR, EST NON AFRICAN AMERICAN: 37 mL/min — AB (ref >60)

## 2015-12-02 LAB — GLUCOSE, CAPILLARY
GLUCOSE-CAPILLARY: 129 mg/dL — AB (ref 65–99)
Glucose-Capillary: 241 mg/dL — ABNORMAL HIGH (ref 65–99)

## 2015-12-02 MED ORDER — SULFAMETHOXAZOLE-TRIMETHOPRIM 800-160 MG PO TABS: 1.0000 | ORAL_TABLET | Freq: Two times a day (BID) | ORAL | Status: DC

## 2015-12-02 MED ORDER — METOPROLOL TARTRATE 5 MG/5ML IV SOLN
5.0000 mg | Freq: Once | INTRAVENOUS | Status: AC
  Administered 2015-12-02: 05:00:00 5 mg via INTRAVENOUS
  Filled 2015-12-02: qty 5

## 2015-12-02 NOTE — Plan of Care (Signed)
Problem: Urinary Elimination: Goal: Signs and symptoms of infection will decrease Outcome: Progressing Heart rate in the 140's. Pt did not take bedtime Metoprolol due to lethargy or any other bedtime po meds. MD notified. One time dose of 5 mg of Metoprolol given with heart rate improving into the 80's and 90's. Rhythm has been afib.

## 2015-12-02 NOTE — Progress Notes (Signed)
D/c home

## 2015-12-02 NOTE — Care Management Note (Signed)
Case Management Note  Patient Details  Name: MAURY BAMBA MRN: [id number] Date of Birth: 1924-04-05  Subjective/Objective:      Referral faxed to [facility] requesting home health PT, RN, Nurse Aid . Discharge home today.            Action/Plan:   Expected Discharge Date:                  Expected Discharge Plan:     In-House Referral:     Discharge planning Services     Post Acute Care Choice:    Choice offered to:     DME Arranged:    DME Agency:     HH Arranged:    HH Agency:     Status of Service:     If discussed at [employer] of Stay Meetings, dates discussed:    Additional Comments:  Neidra Girvan A, RN 12/02/2015, 12:33 PM

## 2015-12-02 NOTE — Progress Notes (Signed)
Extensive foley education done with pt's daughter and niece.  Explanation, demonstration, teach back and return demonstration done.  Medications and instructions given. Follow up appts gone over. Daughter verbalizes understanding.  VSS. To be dc home via w/c with daughter

## 2015-12-02 NOTE — Discharge Summary (Signed)
[facility] - [place] at [facility]   PATIENT NAME: Kelsey Chung    MR#:  [id number]  DATE OF BIRTH:  May 26, 1924  DATE OF ADMISSION:  11/28/2015 ADMITTING PHYSICIAN: Altamese Dilling, MD  DATE OF DISCHARGE: 12/02/2015  3:13 PM  PRIMARY CARE PHYSICIAN: Barbette Reichmann, MD    ADMISSION DIAGNOSIS:  Urinary retention [R33.9] Generalized abdominal pain [R10.84] Elevated troponin [R79.89] Acute UTI [N39.0] Altered mental status, unspecified altered mental status type [R41.82]  DISCHARGE DIAGNOSIS:  Active Problems:   UTI (lower urinary tract infection)   UTI (urinary tract infection)   SECONDARY DIAGNOSIS:   Past Medical History  Diagnosis Date  . Diabetes (HCC)   . Hypertension   . Rheumatoid arthritis(714.0)   . Hyperlipidemia   . Gout   . Osteoarthritis (arthritis due to wear and tear of joints)   . Scoliosis   . Constipation, chronic   . CKD (chronic kidney disease) stage 4, GFR 15-29 ml/min (HCC)   . New onset atrial fibrillation (HCC)     a. diagnosed in 11/2015 - admitted for sepsis  . TIA (transient ischemic attack)     a. multiple TIA's 20+ years ago according to patient's daughter    HOSPITAL COURSE:   1. Acute cystitis which is recurrent secondary to urinary retention. Unfortunately nothing grew out of the urine cultures. Patient seen in consultation by Dr. Sampson Goon infectious disease and he recommended continuing aztreonam until discharge and switching over to oral Bactrim twice a day for 7 days. Urology recommended to keep Foley in for now and follow-up as outpatient. Foley care. 2. Atrial fibrillation. Patient is rate controlled and anticoagulated with eliquis 3. Esophageal spasm 4. Acute renal failure on chronic kidney disease stage III. Need to watch creatinine closely as outpatient on Bactrim. 5. Hyperlipidemia unspecified on statin 6. Type 2 diabetes mellitus continue usual medications 7. Upper extremity weakness. CT scan of  the head negative. 8. Weakness physical therapy recommended rehabilitation versus home with home health. Family wanted to take the patient home. Home health set up for patient. Home health to change the Foley every 3 weeks. 9. Rheumatoid arthritis continue usual medications   DISCHARGE CONDITIONS:   Fair  CONSULTS OBTAINED:  Treatment Team:  Mick Sell, MD Christeen Douglas, MD Hildred Laser, MD  DRUG ALLERGIES:   Allergies  Allergen Reactions  . Augmentin [Amoxicillin-Pot Clavulanate] Anaphylaxis and Other (See Comments)    Has patient had a PCN reaction causing immediate rash, facial/tongue/throat swelling, SOB or lightheadedness with hypotension: Yes Has patient had a PCN reaction causing severe rash involving mucus membranes or skin necrosis: No Has patient had a PCN reaction that required hospitalization No Has patient had a PCN reaction occurring within the last 10 years: Yes If all of the above answers are "NO", then may proceed with Cephalosporin use.  . Beta Adrenergic Blockers Other (See Comments)    Reaction:  Unknown  . Clindamycin Other (See Comments)    Reaction:  Redness to face/swollen eyes   . Excedrin Extra Strength [Aspirin-Acetaminophen-Caffeine] Diarrhea  . Keflex [Cephalexin] Swelling and Other (See Comments)    Reaction:  Lip swelling   . Meloxicam Other (See Comments)    Reaction:  Decreased renal function  . Methotrexate Sodium Other (See Comments)    Reaction:  Decreased renal function  . Nsaids Other (See Comments)    Reaction:  Unknown   . Quinine Rash  . Quinolones Rash    DISCHARGE MEDICATIONS:   Discharge Medication List as  of 12/02/2015  2:37 PM    CONTINUE these medications which have CHANGED   Details  sulfamethoxazole-trimethoprim (BACTRIM DS,SEPTRA DS) 800-160 MG tablet Take 1 tablet by mouth every 12 (twelve) hours., Starting 12/02/2015, Until Discontinued, Print      CONTINUE these medications which have NOT CHANGED    Details  allopurinol (ZYLOPRIM) 100 MG tablet Take 100 mg by mouth 2 (two) times daily. , Until Discontinued, Historical Med    apixaban (ELIQUIS) 2.5 MG TABS tablet Take 1 tablet (2.5 mg total) by mouth 2 (two) times daily., Starting 11/27/2015, Until Discontinued, Normal    aspirin EC 81 MG tablet Take 81 mg by mouth every evening. , Until Discontinued, Historical Med    cholecalciferol (VITAMIN D) 1000 units tablet Take 1,000 Units by mouth daily., Until Discontinued, Historical Med    diltiazem (CARDIZEM CD) 240 MG 24 hr capsule Take 1 capsule (240 mg total) by mouth daily., Starting 11/26/2015, Until Discontinued, Print    ferrous sulfate 325 (65 FE) MG tablet Take 325 mg by mouth daily with breakfast., Until Discontinued, Historical Med    gabapentin (NEURONTIN) 600 MG tablet Take 600 mg by mouth 3 (three) times daily., Until Discontinued, Historical Med    glipiZIDE (GLUCOTROL) 5 MG tablet Take 5 mg by mouth 2 (two) times daily before a meal. , Until Discontinued, Historical Med    glucosamine-chondroitin 500-400 MG tablet Take 1 tablet by mouth daily., Until Discontinued, Historical Med    hydrochlorothiazide (HYDRODIURIL) 25 MG tablet Take 25 mg by mouth daily. , Until Discontinued, Historical Med    hydroxychloroquine (PLAQUENIL) 200 MG tablet Take 200 mg by mouth daily., Until Discontinued, Historical Med    lactulose (CHRONULAC) 10 GM/15ML solution Take 20 g by mouth 2 (two) times daily. , Until Discontinued, Historical Med    metoprolol tartrate (LOPRESSOR) 25 MG tablet Take 0.5 tablets (12.5 mg total) by mouth 2 (two) times daily., Starting 11/26/2015, Until Discontinued, Print    mupirocin cream (BACTROBAN) 2 % Apply 1 application topically at bedtime as needed (for ulcers on feet). , Until Discontinued, Historical Med    omega-3 acid ethyl esters (LOVAZA) 1 g capsule Take 1 g by mouth 2 (two) times daily., Until Discontinued, Historical Med    pantoprazole (PROTONIX) 40 MG  tablet Take 40 mg by mouth daily. , Until Discontinued, Historical Med    rOPINIRole (REQUIP) 0.5 MG tablet Take 0.5 mg by mouth at bedtime., Until Discontinued, Historical Med    traMADol (ULTRAM) 50 MG tablet Take 50 mg by mouth every 8 (eight) hours as needed for moderate pain. , Until Discontinued, Historical Med    Turmeric 500 MG TABS Take 500 mg by mouth daily., Until Discontinued, Historical Med    vitamin B-12 (CYANOCOBALAMIN) 1000 MCG tablet Take 1,000 mcg by mouth daily., Until Discontinued, Historical Med    vitamin C (ASCORBIC ACID) 500 MG tablet Take 500 mg by mouth daily., Until Discontinued, Historical Med         DISCHARGE INSTRUCTIONS:   Follow-up with urology as outpatient Follow-up PMD in 1 week  If you experience worsening of your admission symptoms, develop shortness of breath, life threatening emergency, suicidal or homicidal thoughts you must seek medical attention immediately by calling 911 or calling your MD immediately  if symptoms less severe.  You Must read complete instructions/literature along with all the possible adverse reactions/side effects for all the Medicines you take and that have been prescribed to you. Take any new Medicines after you  have completely understood and accept all the possible adverse reactions/side effects.   Please note  You were cared for by a hospitalist during your hospital stay. If you have any questions about your discharge medications or the care you received while you were in the hospital after you are discharged, you can call the unit and asked to speak with the hospitalist on call if the hospitalist that took care of you is not available. Once you are discharged, your primary care physician will handle any further medical issues. Please note that NO REFILLS for any discharge medications will be authorized once you are discharged, as it is imperative that you return to your primary care physician (or establish a relationship  with a primary care physician if you do not have one) for your aftercare needs so that they can reassess your need for medications and monitor your lab values.    Today   CHIEF COMPLAINT:   Chief Complaint  Patient presents with  . Urinary Frequency    HISTORY OF PRESENT ILLNESS:  Kelsey Chung  is a 82 y.o. female presented back to the hospital with urinary symptoms   VITAL SIGNS:  Blood pressure 164/81, pulse 103, temperature 98.3 F (36.8 C), temperature source Oral, resp. rate 18, height 4\' 11"  (1.499 m), weight 79.833 kg (176 lb), SpO2 96 %.    PHYSICAL EXAMINATION:  GENERAL:  82 y.o.-year-old patient lying in the bed with no acute distress.  EYES: Pupils equal, round, reactive to light and accommodation. No scleral icterus. Extraocular muscles intact.  HEENT: Head atraumatic, normocephalic. Oropharynx and nasopharynx clear.  NECK:  Supple, no jugular venous distention. No thyroid enlargement, no tenderness.  LUNGS: Decreased breath sounds bilaterally, slight expiratory wheeze at bases. No rales,rhonchi or crepitation. No use of accessory muscles of respiration.  CARDIOVASCULAR: S1, S2 normal. No murmurs, rubs, or gallops.  ABDOMEN: Soft, non-tender, non-distended. Bowel sounds present. No organomegaly or mass.  EXTREMITIES: No pedal edema, cyanosis, or clubbing.  NEUROLOGIC: Cranial nerves II through XII are intact. Muscle strength 5/5 in all extremities. Sensation intact. Gait not checked.  PSYCHIATRIC: The patient is alert and oriented x 3.  SKIN: No obvious rash, lesion, or ulcer.   DATA REVIEW:   CBC  Recent Labs Lab 12/02/15 0654  WBC 16.3*  HGB 9.5*  HCT 28.6*  PLT 418    Chemistries   Recent Labs Lab 11/28/15 1423  12/01/15 0533 12/02/15 0654  NA 137  < > 140  --   K 3.7  < > 4.2  --   CL 109  < > 109  --   CO2 15*  < > 26  --   GLUCOSE 141*  < > 146*  --   BUN 52*  < > 32*  --   CREATININE 3.28*  < > 1.62* 1.25*  CALCIUM 9.0  < > 9.2  --    AST 26  --   --   --   ALT 24  --   --   --   ALKPHOS 93  --   --   --   BILITOT 0.6  --   --   --   < > = values in this interval not displayed.  Cardiac Enzymes  Recent Labs Lab 11/30/15 0954  TROPONINI 0.04*    Microbiology Results  Results for orders placed or performed during the hospital encounter of 11/28/15  Urine culture     Status: None   Collection Time: 11/28/15  2:23 PM  Result Value Ref Range Status   Specimen Description URINE, RANDOM  Final   Special Requests NONE  Final   Culture NO GROWTH Performed at [facility]   Final   Report Status 11/29/2015 FINAL  Final  Blood culture (routine x 2)     Status: None (Preliminary result)   Collection Time: 11/28/15  2:45 PM  Result Value Ref Range Status   Specimen Description BLOOD LEFT FA  Final   Special Requests   Final    BOTTLES DRAWN AEROBIC AND ANAEROBIC AER ANA   Culture NO GROWTH 4 DAYS  Final   Report Status PENDING  Incomplete  Blood culture (routine x 2)     Status: None (Preliminary result)   Collection Time: 11/28/15  2:50 PM  Result Value Ref Range Status   Specimen Description BLOOD RIGHT AC  Final   Special Requests   Final    BOTTLES DRAWN AEROBIC AND ANAEROBIC AER ANA   Culture NO GROWTH 4 DAYS  Final   Report Status PENDING  Incomplete  Wet prep, genital     Status: Abnormal   Collection Time: 11/30/15  5:29 PM  Result Value Ref Range Status   Yeast Wet Prep HPF POC NONE SEEN NONE SEEN Final   Trich, Wet Prep NONE SEEN NONE SEEN Final   Clue Cells Wet Prep HPF POC NONE SEEN NONE SEEN Final   WBC, Wet Prep HPF POC MANY (A) NONE SEEN Final   Sperm NONE SEEN  Final    Management plans discussed with the patient, family and they are in agreement.  CODE STATUS:     Code Status Orders        Start     Ordered   11/28/15 1737  Full code   Continuous     11/28/15 1736    Code Status History    Date Active Date Inactive Code Status Order ID Comments User  Context   11/21/2015 11:08 PM 11/26/2015  3:53 PM Full Code [id number]  Altamese Dilling, MD ED    Advance Directive Documentation        Most Recent Value   Type of Advance Directive  Healthcare Power of Attorney, Living will   Pre-existing out of facility DNR order (yellow form or pink MOST form)     "MOST" Form in Place?        TOTAL TIME TAKING CARE OF THIS PATIENT: 35 minutes.    Alford Highland M.D on 12/02/2015 at 5:17 PM  Between 7am to 6pm - Pager - [phone]  After 6pm go to [email or web address] - password [email or web address]  [employer] [facility] Office  [phone]  CC: Primary care physician; Barbette Reichmann, MD

## 2015-12-03 LAB — CULTURE, BLOOD (ROUTINE X 2)
CULTURE: NO GROWTH
Culture: NO GROWTH

## 2015-12-06 ENCOUNTER — Ambulatory Visit (INDEPENDENT_AMBULATORY_CARE_PROVIDER_SITE_OTHER): Payer: Medicare Other | Admitting: Podiatry

## 2015-12-06 ENCOUNTER — Encounter: Payer: Self-pay | Admitting: Podiatry

## 2015-12-06 DIAGNOSIS — L97511 Non-pressure chronic ulcer of other part of right foot limited to breakdown of skin: Secondary | ICD-10-CM

## 2015-12-06 DIAGNOSIS — L89891 Pressure ulcer of other site, stage 1: Secondary | ICD-10-CM

## 2015-12-06 DIAGNOSIS — L97401 Non-pressure chronic ulcer of unspecified heel and midfoot limited to breakdown of skin: Secondary | ICD-10-CM

## 2015-12-06 NOTE — Progress Notes (Signed)
She presents today for follow-up of ulceration to the posterior aspect of the bilateral heels to the fourth digit of the right foot and was elongated painful nails. He is recently been in the hospital twice for urinary tract infection. She presents today in a wheelchair.  Objective: Vital signs are stable she is alert and oriented 3. Pulses are palpable. All of her wounds along the heel uneventfully her nails are elongated I debrided this for her today.  Assessment: Well healing ulcers bilateral foot pain in limb secondary to onychomycosis.  Plan: Debridement of toenails 1 through 5 bilateral.

## 2015-12-08 ENCOUNTER — Telehealth: Payer: Self-pay | Admitting: Cardiovascular Disease

## 2015-12-08 NOTE — Telephone Encounter (Signed)
Received cardiac clearance request for pt to proceed w/ EGD.   Dr. Baldo Daub would like clearance to hold Eliquis 5 days prior to procedure. Pt was consulted on in the hospital by Dr. Mariah Milling, sched to see Ward Givens, NP for TCM on 12/14/15. Please fax recommendation to [facility] GI Procedures Nurse Care Coordinator @ [phone], attn: Triage Nurse.

## 2015-12-08 NOTE — Telephone Encounter (Signed)
Would wait until office follow-up in her clinic

## 2015-12-13 ENCOUNTER — Emergency Department: Payer: Medicare Other

## 2015-12-13 ENCOUNTER — Encounter: Payer: Self-pay | Admitting: Internal Medicine

## 2015-12-13 ENCOUNTER — Inpatient Hospital Stay
Admission: EM | Admit: 2015-12-13 | Discharge: 2015-12-19 | DRG: 682 | Disposition: A | Discharge status: Hospice | Payer: Medicare Other | Attending: Internal Medicine | Admitting: Internal Medicine

## 2015-12-13 ENCOUNTER — Ambulatory Visit: Payer: Medicare Other

## 2015-12-13 DIAGNOSIS — Z515 Encounter for palliative care: Secondary | ICD-10-CM | POA: Diagnosis not present

## 2015-12-13 DIAGNOSIS — E11649 Type 2 diabetes mellitus with hypoglycemia without coma: Secondary | ICD-10-CM | POA: Diagnosis present

## 2015-12-13 DIAGNOSIS — J9601 Acute respiratory failure with hypoxia: Secondary | ICD-10-CM | POA: Diagnosis present

## 2015-12-13 DIAGNOSIS — M1990 Unspecified osteoarthritis, unspecified site: Secondary | ICD-10-CM | POA: Diagnosis present

## 2015-12-13 DIAGNOSIS — E860 Dehydration: Secondary | ICD-10-CM | POA: Diagnosis present

## 2015-12-13 DIAGNOSIS — Y95 Nosocomial condition: Secondary | ICD-10-CM | POA: Diagnosis present

## 2015-12-13 DIAGNOSIS — I130 Hypertensive heart and chronic kidney disease with heart failure and stage 1 through stage 4 chronic kidney disease, or unspecified chronic kidney disease: Secondary | ICD-10-CM | POA: Diagnosis present

## 2015-12-13 DIAGNOSIS — K224 Dyskinesia of esophagus: Secondary | ICD-10-CM | POA: Diagnosis present

## 2015-12-13 DIAGNOSIS — R4182 Altered mental status, unspecified: Secondary | ICD-10-CM | POA: Diagnosis present

## 2015-12-13 DIAGNOSIS — N179 Acute kidney failure, unspecified: Secondary | ICD-10-CM | POA: Diagnosis present

## 2015-12-13 DIAGNOSIS — R627 Adult failure to thrive: Secondary | ICD-10-CM | POA: Diagnosis present

## 2015-12-13 DIAGNOSIS — K5900 Constipation, unspecified: Secondary | ICD-10-CM | POA: Diagnosis present

## 2015-12-13 DIAGNOSIS — N184 Chronic kidney disease, stage 4 (severe): Secondary | ICD-10-CM | POA: Diagnosis present

## 2015-12-13 DIAGNOSIS — I509 Heart failure, unspecified: Secondary | ICD-10-CM

## 2015-12-13 DIAGNOSIS — Z8673 Personal history of transient ischemic attack (TIA), and cerebral infarction without residual deficits: Secondary | ICD-10-CM

## 2015-12-13 DIAGNOSIS — Z7984 Long term (current) use of oral hypoglycemic drugs: Secondary | ICD-10-CM

## 2015-12-13 DIAGNOSIS — E1122 Type 2 diabetes mellitus with diabetic chronic kidney disease: Secondary | ICD-10-CM | POA: Diagnosis present

## 2015-12-13 DIAGNOSIS — Z79899 Other long term (current) drug therapy: Secondary | ICD-10-CM | POA: Diagnosis not present

## 2015-12-13 DIAGNOSIS — J189 Pneumonia, unspecified organism: Secondary | ICD-10-CM | POA: Diagnosis present

## 2015-12-13 DIAGNOSIS — T83518A Infection and inflammatory reaction due to other urinary catheter, initial encounter: Secondary | ICD-10-CM | POA: Diagnosis present

## 2015-12-13 DIAGNOSIS — R0902 Hypoxemia: Secondary | ICD-10-CM

## 2015-12-13 DIAGNOSIS — Z7901 Long term (current) use of anticoagulants: Secondary | ICD-10-CM

## 2015-12-13 DIAGNOSIS — I4891 Unspecified atrial fibrillation: Secondary | ICD-10-CM | POA: Diagnosis present

## 2015-12-13 DIAGNOSIS — M069 Rheumatoid arthritis, unspecified: Secondary | ICD-10-CM | POA: Diagnosis present

## 2015-12-13 DIAGNOSIS — R680 Hypothermia, not associated with low environmental temperature: Secondary | ICD-10-CM | POA: Diagnosis present

## 2015-12-13 DIAGNOSIS — Y738 Miscellaneous gastroenterology and urology devices associated with adverse incidents, not elsewhere classified: Secondary | ICD-10-CM | POA: Diagnosis present

## 2015-12-13 DIAGNOSIS — E785 Hyperlipidemia, unspecified: Secondary | ICD-10-CM | POA: Diagnosis present

## 2015-12-13 DIAGNOSIS — A419 Sepsis, unspecified organism: Secondary | ICD-10-CM

## 2015-12-13 DIAGNOSIS — D631 Anemia in chronic kidney disease: Secondary | ICD-10-CM | POA: Diagnosis present

## 2015-12-13 DIAGNOSIS — Z66 Do not resuscitate: Secondary | ICD-10-CM | POA: Diagnosis present

## 2015-12-13 DIAGNOSIS — M109 Gout, unspecified: Secondary | ICD-10-CM | POA: Diagnosis present

## 2015-12-13 DIAGNOSIS — M419 Scoliosis, unspecified: Secondary | ICD-10-CM | POA: Diagnosis present

## 2015-12-13 DIAGNOSIS — I5033 Acute on chronic diastolic (congestive) heart failure: Secondary | ICD-10-CM | POA: Diagnosis present

## 2015-12-13 DIAGNOSIS — E162 Hypoglycemia, unspecified: Secondary | ICD-10-CM | POA: Diagnosis present

## 2015-12-13 DIAGNOSIS — Z7982 Long term (current) use of aspirin: Secondary | ICD-10-CM | POA: Diagnosis not present

## 2015-12-13 DIAGNOSIS — T68XXXA Hypothermia, initial encounter: Secondary | ICD-10-CM

## 2015-12-13 DIAGNOSIS — R339 Retention of urine, unspecified: Secondary | ICD-10-CM

## 2015-12-13 LAB — GLUCOSE, CAPILLARY
GLUCOSE-CAPILLARY: 102 mg/dL — AB (ref 65–99)
GLUCOSE-CAPILLARY: 127 mg/dL — AB (ref 65–99)
GLUCOSE-CAPILLARY: 69 mg/dL (ref 65–99)
Glucose-Capillary: 81 mg/dL (ref 65–99)

## 2015-12-13 LAB — URINALYSIS COMPLETE WITH MICROSCOPIC (ARMC ONLY)
BILIRUBIN URINE: NEGATIVE
GLUCOSE, UA: NEGATIVE mg/dL
KETONES UR: NEGATIVE mg/dL
NITRITE: NEGATIVE
Protein, ur: 100 mg/dL — AB
SPECIFIC GRAVITY, URINE: 1.016 (ref 1.005–1.030)
Squamous Epithelial / LPF: NONE SEEN
pH: 5 (ref 5.0–8.0)

## 2015-12-13 LAB — COMPREHENSIVE METABOLIC PANEL
ALBUMIN: 2.8 g/dL — AB (ref 3.5–5.0)
ALK PHOS: 85 U/L (ref 38–126)
ALT: 15 U/L (ref 14–54)
ANION GAP: 7 (ref 5–15)
AST: 22 U/L (ref 15–41)
BILIRUBIN TOTAL: 0.2 mg/dL — AB (ref 0.3–1.2)
BUN: 52 mg/dL — ABNORMAL HIGH (ref 6–20)
CALCIUM: 9.5 mg/dL (ref 8.9–10.3)
CO2: 28 mmol/L (ref 22–32)
Chloride: 104 mmol/L (ref 101–111)
Creatinine, Ser: 2.8 mg/dL — ABNORMAL HIGH (ref 0.44–1.00)
GFR calc Af Amer: 16 mL/min — ABNORMAL LOW (ref >60)
GFR, EST NON AFRICAN AMERICAN: 14 mL/min — AB (ref >60)
GLUCOSE: 104 mg/dL — AB (ref 65–99)
POTASSIUM: 5.3 mmol/L — AB (ref 3.5–5.1)
Sodium: 139 mmol/L (ref 135–145)
TOTAL PROTEIN: 6.7 g/dL (ref 6.5–8.1)

## 2015-12-13 LAB — CBC WITH DIFFERENTIAL/PLATELET
BASOS ABS: 0.1 10*3/uL (ref 0–0.1)
BASOS PCT: 1 %
EOS PCT: 1 %
Eosinophils Absolute: 0 10*3/uL (ref 0–0.7)
HEMATOCRIT: 25 % — AB (ref 35.0–47.0)
Hemoglobin: 8.1 g/dL — ABNORMAL LOW (ref 12.0–16.0)
LYMPHS PCT: 7 %
Lymphs Abs: 0.6 10*3/uL — ABNORMAL LOW (ref 1.0–3.6)
MCH: 34.2 pg — ABNORMAL HIGH (ref 26.0–34.0)
MCHC: 32.3 g/dL (ref 32.0–36.0)
MCV: 105.9 fL — ABNORMAL HIGH (ref 80.0–100.0)
MONO ABS: 0.5 10*3/uL (ref 0.2–0.9)
Monocytes Relative: 6 %
NEUTROS ABS: 7.2 10*3/uL — AB (ref 1.4–6.5)
Neutrophils Relative %: 85 %
PLATELETS: 291 10*3/uL (ref 150–440)
RBC: 2.36 MIL/uL — AB (ref 3.80–5.20)
RDW: 21.9 % — AB (ref 11.5–14.5)
WBC: 8.4 10*3/uL (ref 3.6–11.0)

## 2015-12-13 LAB — LACTIC ACID, PLASMA
LACTIC ACID, VENOUS: 1.4 mmol/L (ref 0.5–1.9)
Lactic Acid, Venous: 1.2 mmol/L (ref 0.5–1.9)

## 2015-12-13 LAB — TSH: TSH: 2.191 u[IU]/mL (ref 0.350–4.500)

## 2015-12-13 MED ORDER — LACTULOSE 10 GM/15ML PO SOLN
20.0000 g | Freq: Two times a day (BID) | ORAL | Status: DC
  Administered 2015-12-13 – 2015-12-14 (×2): 20 g via ORAL
  Filled 2015-12-13 (×2): qty 30

## 2015-12-13 MED ORDER — METOPROLOL TARTRATE 25 MG PO TABS
12.5000 mg | ORAL_TABLET | Freq: Two times a day (BID) | ORAL | Status: DC
  Administered 2015-12-14 – 2015-12-15 (×4): 12.5 mg via ORAL
  Filled 2015-12-13 (×5): qty 1

## 2015-12-13 MED ORDER — PANTOPRAZOLE SODIUM 40 MG PO TBEC
40.0000 mg | DELAYED_RELEASE_TABLET | Freq: Every day | ORAL | Status: DC
  Administered 2015-12-14 – 2015-12-18 (×5): 40 mg via ORAL
  Filled 2015-12-13 (×5): qty 1

## 2015-12-13 MED ORDER — SODIUM CHLORIDE 0.9 % IV BOLUS (SEPSIS)
1000.0000 mL | Freq: Once | INTRAVENOUS | Status: AC
  Administered 2015-12-13: 1000 mL via INTRAVENOUS

## 2015-12-13 MED ORDER — ONDANSETRON HCL 4 MG PO TABS: 4.0000 mg | ORAL_TABLET | Freq: Four times a day (QID) | ORAL | Status: DC | PRN

## 2015-12-13 MED ORDER — OXYCODONE HCL 5 MG PO TABS: 5.0000 mg | ORAL_TABLET | ORAL | Status: DC | PRN

## 2015-12-13 MED ORDER — DEXTROSE 50 % IV SOLN
INTRAVENOUS | Status: AC
  Administered 2015-12-13: 12.5 g via INTRAVENOUS
  Filled 2015-12-13: qty 50

## 2015-12-13 MED ORDER — DEXTROSE-NACL 5-0.45 % IV SOLN
INTRAVENOUS | Status: DC
  Administered 2015-12-13: 22:00:00 via INTRAVENOUS
  Administered 2015-12-14: 75 mL/h via INTRAVENOUS

## 2015-12-13 MED ORDER — SODIUM CHLORIDE 0.9% FLUSH
3.0000 mL | Freq: Two times a day (BID) | INTRAVENOUS | Status: DC
  Administered 2015-12-14 – 2015-12-17 (×7): 3 mL via INTRAVENOUS

## 2015-12-13 MED ORDER — APIXABAN 2.5 MG PO TABS
2.5000 mg | ORAL_TABLET | Freq: Two times a day (BID) | ORAL | Status: DC
  Administered 2015-12-13 – 2015-12-15 (×4): 2.5 mg via ORAL
  Filled 2015-12-13 (×5): qty 1

## 2015-12-13 MED ORDER — VITAMIN B-12 1000 MCG PO TABS: 1000.0000 ug | ORAL_TABLET | Freq: Every day | ORAL | Status: DC

## 2015-12-13 MED ORDER — GABAPENTIN 600 MG PO TABS: 600.0000 mg | ORAL_TABLET | Freq: Three times a day (TID) | ORAL | Status: DC

## 2015-12-13 MED ORDER — GLUCOSAMINE-CHONDROITIN 500-400 MG PO TABS: 1.0000 | ORAL_TABLET | Freq: Every day | ORAL | Status: DC

## 2015-12-13 MED ORDER — TRAMADOL HCL 50 MG PO TABS
50.0000 mg | ORAL_TABLET | Freq: Two times a day (BID) | ORAL | Status: DC | PRN
  Administered 2015-12-17: 50 mg via ORAL
  Filled 2015-12-13: qty 1

## 2015-12-13 MED ORDER — TURMERIC 500 MG PO TABS: 500.0000 mg | ORAL_TABLET | Freq: Every day | ORAL | Status: DC

## 2015-12-13 MED ORDER — ACETAMINOPHEN 650 MG RE SUPP: 650.0000 mg | Freq: Four times a day (QID) | RECTAL | Status: DC | PRN

## 2015-12-13 MED ORDER — MORPHINE SULFATE (PF) 2 MG/ML IV SOLN: 2.0000 mg | INTRAVENOUS | Status: DC | PRN

## 2015-12-13 MED ORDER — ROPINIROLE HCL 0.25 MG PO TABS
0.5000 mg | ORAL_TABLET | Freq: Every day | ORAL | Status: DC
  Administered 2015-12-13 – 2015-12-17 (×5): 0.5 mg via ORAL
  Filled 2015-12-13 (×5): qty 2

## 2015-12-13 MED ORDER — FERROUS SULFATE 325 (65 FE) MG PO TABS
325.0000 mg | ORAL_TABLET | Freq: Every day | ORAL | Status: DC
  Administered 2015-12-14 – 2015-12-18 (×4): 325 mg via ORAL
  Filled 2015-12-13 (×5): qty 1

## 2015-12-13 MED ORDER — DEXTROSE 50 % IV SOLN
12.5000 g | Freq: Once | INTRAVENOUS | Status: AC
  Administered 2015-12-13: 12.5 g via INTRAVENOUS

## 2015-12-13 MED ORDER — ALLOPURINOL 100 MG PO TABS
100.0000 mg | ORAL_TABLET | Freq: Two times a day (BID) | ORAL | Status: DC
  Administered 2015-12-13 – 2015-12-19 (×11): 100 mg via ORAL
  Filled 2015-12-13 (×11): qty 1

## 2015-12-13 MED ORDER — SODIUM CHLORIDE 0.9 % IV SOLN
500.0000 mg | Freq: Two times a day (BID) | INTRAVENOUS | Status: DC
  Administered 2015-12-14 – 2015-12-15 (×3): 500 mg via INTRAVENOUS
  Filled 2015-12-13 (×4): qty 0.5

## 2015-12-13 MED ORDER — ONDANSETRON HCL 4 MG/2ML IJ SOLN: 4.0000 mg | Freq: Four times a day (QID) | INTRAMUSCULAR | Status: DC | PRN

## 2015-12-13 MED ORDER — DIATRIZOATE MEGLUMINE & SODIUM 66-10 % PO SOLN: 30.0000 mL | Freq: Once | ORAL | Status: DC

## 2015-12-13 MED ORDER — VITAMIN D 1000 UNITS PO TABS
1000.0000 [IU] | ORAL_TABLET | Freq: Every day | ORAL | Status: DC
  Administered 2015-12-14 – 2015-12-17 (×4): 1000 [IU] via ORAL
  Filled 2015-12-13 (×5): qty 1

## 2015-12-13 MED ORDER — DILTIAZEM HCL ER COATED BEADS 120 MG PO CP24
240.0000 mg | ORAL_CAPSULE | Freq: Every day | ORAL | Status: DC
  Administered 2015-12-14 – 2015-12-16 (×3): 240 mg via ORAL
  Filled 2015-12-13 (×4): qty 2

## 2015-12-13 MED ORDER — ASPIRIN EC 81 MG PO TBEC
81.0000 mg | DELAYED_RELEASE_TABLET | Freq: Every evening | ORAL | Status: DC
  Administered 2015-12-13: 81 mg via ORAL
  Filled 2015-12-13: qty 1

## 2015-12-13 MED ORDER — VITAMIN C 500 MG PO TABS: 500.0000 mg | ORAL_TABLET | Freq: Every day | ORAL | Status: DC

## 2015-12-13 MED ORDER — HYDROXYCHLOROQUINE SULFATE 200 MG PO TABS
200.0000 mg | ORAL_TABLET | Freq: Every day | ORAL | Status: DC
  Administered 2015-12-14 – 2015-12-17 (×4): 200 mg via ORAL
  Filled 2015-12-13 (×6): qty 1

## 2015-12-13 MED ORDER — DEXTROSE-NACL 5-0.45 % IV SOLN
INTRAVENOUS | Status: DC
  Administered 2015-12-13: 20:00:00 via INTRAVENOUS

## 2015-12-13 MED ORDER — SODIUM CHLORIDE 0.9 % IV SOLN
500.0000 mg | Freq: Once | INTRAVENOUS | Status: AC
  Administered 2015-12-13: 500 mg via INTRAVENOUS
  Filled 2015-12-13: qty 0.5

## 2015-12-13 MED ORDER — IPRATROPIUM-ALBUTEROL 0.5-2.5 (3) MG/3ML IN SOLN: 3.0000 mL | RESPIRATORY_TRACT | Status: DC | PRN

## 2015-12-13 MED ORDER — DEXTROSE 50 % IV SOLN
1.0000 | Freq: Once | INTRAVENOUS | Status: AC
  Administered 2015-12-13: 50 mL via INTRAVENOUS

## 2015-12-13 MED ORDER — OMEGA-3-ACID ETHYL ESTERS 1 G PO CAPS
1.0000 g | ORAL_CAPSULE | Freq: Two times a day (BID) | ORAL | Status: DC
  Administered 2015-12-13 – 2015-12-16 (×4): 1 g via ORAL
  Filled 2015-12-13 (×10): qty 1

## 2015-12-13 MED ORDER — DEXTROSE 50 % IV SOLN
INTRAVENOUS | Status: AC
  Administered 2015-12-13: 50 mL
  Filled 2015-12-13: qty 50

## 2015-12-13 MED ORDER — ACETAMINOPHEN 325 MG PO TABS: 650.0000 mg | ORAL_TABLET | Freq: Four times a day (QID) | ORAL | Status: DC | PRN

## 2015-12-13 NOTE — Progress Notes (Signed)
Pt came in today for a voiding trial. Daughter voiced concern of removing foley due to pt having extreme weakness and not able to transfer. Daughter requested foley stay in place until PT can work with pt for at least 37mo. Per Carollee Herter foley can stay in place and pt f/u in 72mo. Daughter voiced understanding and f/u appt

## 2015-12-13 NOTE — Progress Notes (Signed)
Aggie Cosier RN given report. She will take over care of pt.

## 2015-12-13 NOTE — Progress Notes (Signed)
ANTICOAGULATION CONSULT NOTE - Initial Consult  Pharmacy Consult for apixaban Indication: atrial fibrillation  Allergies  Allergen Reactions  . Augmentin [Amoxicillin-Pot Clavulanate] Anaphylaxis and Other (See Comments)    Has patient had a PCN reaction causing immediate rash, facial/tongue/throat swelling, SOB or lightheadedness with hypotension: Yes Has patient had a PCN reaction causing severe rash involving mucus membranes or skin necrosis: No Has patient had a PCN reaction that required hospitalization No Has patient had a PCN reaction occurring within the last 10 years: Yes If all of the above answers are "NO", then may proceed with Cephalosporin use.  . Beta Adrenergic Blockers Other (See Comments)    Reaction:  Unknown  . Clindamycin Other (See Comments)    Reaction:  Redness to face/swollen eyes   . Excedrin Extra Strength [Aspirin-Acetaminophen-Caffeine] Diarrhea  . Keflex [Cephalexin] Swelling and Other (See Comments)    Reaction:  Lip swelling   . Meloxicam Other (See Comments)    Reaction:  Decreased renal function  . Methotrexate Sodium Other (See Comments)    Reaction:  Decreased renal function  . Nsaids Other (See Comments)    Reaction:  Unknown   . Quinine Rash  . Quinolones Rash    Patient Measurements: Height: 4\' 11"  (149.9 cm) Weight: 182 lb 4.8 oz (82.691 kg) IBW/kg (Calculated) : 43.2  Vital Signs: Temp: 99 F (37.2 C) (07/05 2105) Temp Source: Rectal (07/05 1739) BP: 104/49 mmHg (07/05 2105) Pulse Rate: 59 (07/05 2105)  Labs:  Recent Labs  12/13/15 1754  HGB 8.1*  HCT 25.0*  PLT 291  CREATININE 2.80*    Estimated Creatinine Clearance: 12.7 mL/min (by C-G formula based on Cr of 2.8).   Medical History: Past Medical History  Diagnosis Date  . Diabetes (HCC)   . Hypertension   . Rheumatoid arthritis(714.0)   . Hyperlipidemia   . Gout   . Osteoarthritis (arthritis due to wear and tear of joints)   . Scoliosis   . Constipation,  chronic   . CKD (chronic kidney disease) stage 4, GFR 15-29 ml/min (HCC)   . New onset atrial fibrillation (HCC)     a. diagnosed in 11/2015 - admitted for sepsis  . TIA (transient ischemic attack)     a. multiple TIA's 20+ years ago according to patient's daughter   Assessment: Patient was taking apixaban 2.5 mg PO BID PTA for atrial fibrillation.  Goal of Therapy:  Monitor platelets by anticoagulation protocol: Yes   Plan:  Continue current apixaban dose for now. Closely monitor renal function.   12/2015, PharmD 12/13/2015,10:08 PM

## 2015-12-13 NOTE — Progress Notes (Signed)
PHARMACIST - PHYSICIAN ORDER COMMUNICATION  CONCERNING: P&T Medication Policy on Herbal Medications  DESCRIPTION:  This patient's order for:  Glucosamine-chondroitin  has been noted.  This product(s) is classified as an "herbal" or natural product. Due to a lack of definitive safety studies or FDA approval, nonstandard manufacturing practices, plus the potential risk of unknown drug-drug interactions while on inpatient medications, the Pharmacy and Therapeutics Committee does not permit the use of "herbal" or natural products of this type within [facility]   ACTION TAKEN: The pharmacy department is unable to verify this order at this time and your patient has been informed of this safety policy. Please reevaluate patient's clinical condition at discharge and address if the herbal or natural product(s) should be resumed at that time.  Ariani Seier [facility] PharmD 12/13/2015 9:34 PM

## 2015-12-13 NOTE — H&P (Signed)
[employer] [facility] [place] at [facility]   PATIENT NAME: Kelsey Chung    MR#:  [id number]  DATE OF BIRTH:  11/06/1923   DATE OF ADMISSION:  12/13/2015  PRIMARY CARE PHYSICIAN: Barbette Reichmann, MD   REQUESTING/REFERRING PHYSICIAN: Malinda  CHIEF COMPLAINT:   Chief Complaint  Patient presents with  . Altered Mental Status    HISTORY OF PRESENT ILLNESS:  Kelsey Chung  is a 82 y.o. female with a known history of Type 2 diabetes non-insulin-requiring, recent urinary retention requiring catheter presenting with altered mental status described as somnolent/lethargy. Generative history obtained from family member present at bedside. States that the patient has weak and fatigued progressively worsening but most prominent today. When physical therapy came to the house to assess her they were unable to arouse the patient thus prompting emergency department visit. She was noted to be hypoglycemic at home with blood glucometer reading of "low" She is currently on Bactrim to be finished tomorrow for recent urinary tract infection.  PAST MEDICAL HISTORY:   Past Medical History  Diagnosis Date  . Diabetes (HCC)   . Hypertension   . Rheumatoid arthritis(714.0)   . Hyperlipidemia   . Gout   . Osteoarthritis (arthritis due to wear and tear of joints)   . Scoliosis   . Constipation, chronic   . CKD (chronic kidney disease) stage 4, GFR 15-29 ml/min (HCC)   . New onset atrial fibrillation (HCC)     a. diagnosed in 11/2015 - admitted for sepsis  . TIA (transient ischemic attack)     a. multiple TIA's 20+ years ago according to patient's daughter    PAST SURGICAL HISTORY:   Past Surgical History  Procedure Laterality Date  . Hernia repair    . Gallbladder surgery    . Appendectomy    . Ovarian cyst surgery    . Foot surgery Right   . Eye surgery Bilateral   . Breast cyst excision Left   . Cardiac catheterization    . Colonoscopy    . Esophagogastroduodenoscopy  endoscopy    . Dilation and curettage of uterus    . Esophageal manometry N/A 05/24/2015    Procedure: ESOPHAGEAL MANOMETRY (EM);  Surgeon: Elnita Maxwell, MD;  Location: [facility] ENDOSCOPY;  Service: Endoscopy;  Laterality: N/A;    SOCIAL HISTORY:   Social History  Substance Use Topics  . Smoking status: Never Smoker   . Smokeless tobacco: Never Used  . Alcohol Use: No    FAMILY HISTORY:   Family History  Problem Relation Age of Onset  . Breast cancer Mother   . CAD Father     DRUG ALLERGIES:   Allergies  Allergen Reactions  . Augmentin [Amoxicillin-Pot Clavulanate] Anaphylaxis and Other (See Comments)    Has patient had a PCN reaction causing immediate rash, facial/tongue/throat swelling, SOB or lightheadedness with hypotension: Yes Has patient had a PCN reaction causing severe rash involving mucus membranes or skin necrosis: No Has patient had a PCN reaction that required hospitalization No Has patient had a PCN reaction occurring within the last 10 years: Yes If all of the above answers are "NO", then may proceed with Cephalosporin use.  . Beta Adrenergic Blockers Other (See Comments)    Reaction:  Unknown  . Clindamycin Other (See Comments)    Reaction:  Redness to face/swollen eyes   . Excedrin Extra Strength [Aspirin-Acetaminophen-Caffeine] Diarrhea  . Keflex [Cephalexin] Swelling and Other (See Comments)    Reaction:  Lip swelling   .  Meloxicam Other (See Comments)    Reaction:  Decreased renal function  . Methotrexate Sodium Other (See Comments)    Reaction:  Decreased renal function  . Nsaids Other (See Comments)    Reaction:  Unknown   . Quinine Rash  . Quinolones Rash    REVIEW OF SYSTEMS:  Unable to fully obtain given patient's mental status  MEDICATIONS AT HOME:   Prior to Admission medications   Medication Sig Start Date End Date Taking? Authorizing Provider  allopurinol (ZYLOPRIM) 100 MG tablet Take 100 mg by mouth 2 (two) times daily.      Historical Provider, MD  apixaban (ELIQUIS) 2.5 MG TABS tablet Take 1 tablet (2.5 mg total) by mouth 2 (two) times daily. 11/27/15   Antonieta Iba, MD  aspirin EC 81 MG tablet Take 81 mg by mouth every evening.     Historical Provider, MD  cholecalciferol (VITAMIN D) 1000 units tablet Take 1,000 Units by mouth daily.    Historical Provider, MD  diltiazem (CARDIZEM CD) 240 MG 24 hr capsule Take 1 capsule (240 mg total) by mouth daily. 11/26/15   Ramonita Lab, MD  ferrous sulfate 325 (65 FE) MG tablet Take 325 mg by mouth daily with breakfast.    Historical Provider, MD  gabapentin (NEURONTIN) 600 MG tablet Take 600 mg by mouth 3 (three) times daily.    Historical Provider, MD  glipiZIDE (GLUCOTROL) 5 MG tablet Take 5 mg by mouth 2 (two) times daily before a meal.     Historical Provider, MD  glucosamine-chondroitin 500-400 MG tablet Take 1 tablet by mouth daily.    Historical Provider, MD  hydrochlorothiazide (HYDRODIURIL) 25 MG tablet Take 25 mg by mouth daily.     Historical Provider, MD  hydroxychloroquine (PLAQUENIL) 200 MG tablet Take 200 mg by mouth daily.    Historical Provider, MD  lactulose (CHRONULAC) 10 GM/15ML solution Take 20 g by mouth 2 (two) times daily.     Historical Provider, MD  metoprolol tartrate (LOPRESSOR) 25 MG tablet Take 0.5 tablets (12.5 mg total) by mouth 2 (two) times daily. 11/26/15   Ramonita Lab, MD  mupirocin cream (BACTROBAN) 2 % Apply 1 application topically at bedtime as needed (for ulcers on feet).     Historical Provider, MD  omega-3 acid ethyl esters (LOVAZA) 1 g capsule Take 1 g by mouth 2 (two) times daily.    Historical Provider, MD  pantoprazole (PROTONIX) 40 MG tablet Take 40 mg by mouth daily.     Historical Provider, MD  rOPINIRole (REQUIP) 0.5 MG tablet Take 0.5 mg by mouth at bedtime.    Historical Provider, MD  sulfamethoxazole-trimethoprim (BACTRIM DS,SEPTRA DS) 800-160 MG tablet Take 1 tablet by mouth every 12 (twelve) hours. 12/02/15   Alford Highland, MD  traMADol (ULTRAM) 50 MG tablet Take 50 mg by mouth every 8 (eight) hours as needed for moderate pain.     Historical Provider, MD  Turmeric 500 MG TABS Take 500 mg by mouth daily.    Historical Provider, MD  vitamin B-12 (CYANOCOBALAMIN) 1000 MCG tablet Take 1,000 mcg by mouth daily.    Historical Provider, MD  vitamin C (ASCORBIC ACID) 500 MG tablet Take 500 mg by mouth daily.    Historical Provider, MD      VITAL SIGNS:  Blood pressure 141/64, pulse 56, temperature 96.5 F (35.8 C), temperature source Rectal, resp. rate 15, weight 180 lb (81.647 kg), SpO2 98 %.  PHYSICAL EXAMINATION:  VITAL SIGNS: Filed Vitals:  12/13/15 1800 12/13/15 1930  BP: 137/69 141/64  Pulse: 54 56  Temp: 95.6 F (35.3 C) 96.5 F (35.8 C)  Resp: 14 15   GENERAL:82 y.o.female currently in Mild acute distress. Given mental status HEAD: Normocephalic, atraumatic.  EYES: Pupils equal, round, reactive to light. Extraocular muscles intact. No scleral icterus.  MOUTH: Dry mucosal membrane. Dentition intact. No abscess noted.  EAR, NOSE, THROAT: Clear without exudates. No external lesions.  NECK: Supple. No thyromegaly. No nodules. No JVD.  PULMONARY: Clear to ascultation, without wheeze rails or rhonci. No use of accessory muscles, Good respiratory effort. good air entry bilaterally CHEST: Nontender to palpation.  CARDIOVASCULAR: S1 and S2. Irregular rate and rhythm. No murmurs, rubs, or gallops. 1+ edema. Pedal pulses 2+ bilaterally.  GASTROINTESTINAL: Soft, nontender, nondistended. No masses. Positive bowel sounds. No hepatosplenomegaly.  MUSCULOSKELETAL: No swelling, clubbing, or edema. Range of motion full in all extremities.  NEUROLOGIC: Difficult to fully assess given patient's mental status Cranial nerves II through XII are intact. No gross focal neurological deficits. Sensation intact. Reflexes intact.  SKIN: No ulceration, lesions, rashes, or cyanosis. Skin cool and dry. Turgor intact.    PSYCHIATRIC: Mood, affect flat The patient is somnolent but arousable when awake oriented 3 LABORATORY PANEL:   CBC  Recent Labs Lab 12/13/15 1754  WBC 8.4  HGB 8.1*  HCT 25.0*  PLT 291   ------------------------------------------------------------------------------------------------------------------  Chemistries   Recent Labs Lab 12/13/15 1754  NA 139  K 5.3*  CL 104  CO2 28  GLUCOSE 104*  BUN 52*  CREATININE 2.80*  CALCIUM 9.5  AST 22  ALT 15  ALKPHOS 85  BILITOT 0.2*   ------------------------------------------------------------------------------------------------------------------  Cardiac Enzymes No results for input(s): TROPONINI in the last 168 hours. ------------------------------------------------------------------------------------------------------------------  RADIOLOGY:  Ct Abdomen Pelvis Wo Contrast  12/13/2015  CLINICAL DATA:  Altered mental status, distended abdomen, hypoglycemia, diabetes mellitus, hypertension EXAM: CT ABDOMEN AND PELVIS WITHOUT CONTRAST TECHNIQUE: Multidetector CT imaging of the abdomen and pelvis was performed following the standard protocol without IV contrast. Sagittal and coronal MPR images reconstructed from axial data set. COMPARISON:  11/28/2015 FINDINGS: Lower chest:  Bibasilar pleural effusions and atelectasis. Hepatobiliary: Gallbladder surgically absent. No focal hepatic abnormalities. Pancreas: Atrophic, otherwise unremarkable Spleen: Normal appearance Adrenals/Urinary Tract: Adrenal glands normal appearance vascular calcifications a kidneys. BILATERAL cortical renal atrophy. No mass or hydronephrosis. Foley catheter within urinary bladder with intravesicular air as well. Questionable wall thickening versus dependent density at posterior LEFT bladder base Stomach/Bowel: Appendix surgically absent by history. Stomach decompressed, unable to accurately assess wall thickness. Large and small bowel loops unremarkable. No bowel  dilatation or bowel wall thickening. Vascular/Lymphatic: Scattered atherosclerotic calcifications aorta, iliac arteries, abdominal branch vessels and aortic root. Mitral annular calcification. Reproductive: Uterine calcification likely a degenerated leiomyoma. Otherwise unremarkable uterus and adnexa Other: Nonspecific presacral density. No free intraperitoneal air or fluid. No hernia. Musculoskeletal: Osseous demineralization with degenerative disc and facet disease changes lumbar spine associated with scoliosis. IMPRESSION: No definite acute intra-abdominal or intrapelvic abnormalities. Bibasilar pleural effusions and atelectasis. Aortic atherosclerosis. Electronically Signed   By: Ulyses Southward M.D.   On: 12/13/2015 19:16   Ct Head Wo Contrast  12/13/2015  CLINICAL DATA:  Altered mental status and abdominal distention. EXAM: CT HEAD WITHOUT CONTRAST TECHNIQUE: Contiguous axial images were obtained from the base of the skull through the vertex without intravenous contrast. COMPARISON:  Head CT 11/30/2015. FINDINGS: There is some atrophy and chronic microvascular change. No acute abnormality including hemorrhage, infarct, mass lesion,  mass effect, midline shift or abnormal extra axial fluid collection is identified. Imaged paranasal sinuses and mastoid air cells are clear. The calvarium is intact. IMPRESSION: No acute abnormality. Atrophy and chronic microvascular ischemic change. Electronically Signed   By: Drusilla Kanner M.D.   On: 12/13/2015 19:16   Dg Chest Portable 1 View  12/13/2015  CLINICAL DATA:  Altered mental status EXAM: PORTABLE CHEST 1 VIEW COMPARISON:  11/28/2015 FINDINGS: 1905 hours. Low volume film. Patchy opacities are noted in the lungs bilaterally, right greater than left. The cardio pericardial silhouette is enlarged. The visualized bony structures of the thorax are intact. Telemetry leads overlie the chest. IMPRESSION: Interval development of bilateral patchy airspace opacities. Given the  rapid interval appearance, infectious/inflammatory process is suspected. Dedicated upright two view chest x-ray may prove helpful to further evaluate. Cardiomegaly. Electronically Signed   By: Kennith Center M.D.   On: 12/13/2015 19:10    EKG:   Orders placed or performed during the hospital encounter of 12/13/15  . EKG 12-Lead  . EKG 12-Lead  . EKG 12-Lead  . EKG 12-Lead  . ED EKG 12-Lead  . ED EKG 12-Lead    IMPRESSION AND PLAN:   82 year old Caucasian female history of atrial fibrillation on eliquis, type 2 diabetes non-insulin-requiring presenting with lethargy  1. Acute kidney injury: In the setting of Foley catheter for urinary retention, Foley repositioned in emergency department with approximately 200 mL urine (cloudy) returned, as well as poor oral intake. IV fluid hydration follow urine output renal function if no improvement will involve nephrology, avoid further nephrotoxic agents 2. Hypoglycemia in relation to type 2 diabetes: Hold oral agents add dextrose to fluids, Accu-Cheks every 2 hours until stabilized (glucose continues to greater than 100) then can be spaced out to every 4 hours 3. Possible urinary tract infection: Urinalysis pending at this time received meropenem and emergency department follow urinalysis/urine culture 4. Essential hypertension: Hold hydrochlorothiazide 5. Atrial fibrillation rate controlled: We'll consult pharmacy for [facility] was management in poor renal state   All the records are reviewed and case discussed with ED provider. Management plans discussed with the patient, family and they are in agreement.  CODE STATUS: Full  TOTAL TIME TAKING CARE OF THIS PATIENT: 40 minutes.    Hower,  Mardi Mainland.D on 12/13/2015 at 7:55 PM  Between 7am to 6pm - Pager - [phone]  After 6pm: House Pager: - [phone]  [facility] [place] [facility]  Office  [phone]  CC: Primary care physician; Barbette Reichmann, MD

## 2015-12-13 NOTE — ED Notes (Signed)
Patient transported to CT 

## 2015-12-13 NOTE — ED Notes (Signed)
Bear hugger placed on pt due to temp.MD made aware.

## 2015-12-13 NOTE — ED Provider Notes (Addendum)
[facility] Emergency Department Provider Note   ____________________________________________  Time seen: Approximately 6:36 PM  I have reviewed the triage vital signs and the nursing notes.   HISTORY  Chief Complaint Altered Mental Status History Limited by altered mental status  HPI Kelsey Chung is a 82 y.o. female patient comes from home. History provided by daughter and EMS. Patient has been gaining by mouth Bactrim for a UTI. She went to see the urologist today when she came back she was very tired so she went to bed. Physical therapy came in the afternoon and couldn't get the patient up so EMS was called. EMS found a low very low blood sugar. Patient got D50 and came to the hospital. Patient's daughter reports patient's been eating very little and has not drank anything all day long today. Foley has not been draining all day long. Emergency room bladder scan was done and showed only about 50 cc of urine. She was hypothermic. Temperature was 95 rectal this was confirmed twice. Patient is difficult to arouse when she wakes up she is awake alert oriented to person and place. But falls asleep almost immediately.Recheck a fingerstick shows fingerstick of 81 at 5:30.   Past Medical History  Diagnosis Date  . Diabetes (HCC)   . Hypertension   . Rheumatoid arthritis(714.0)   . Hyperlipidemia   . Gout   . Osteoarthritis (arthritis due to wear and tear of joints)   . Scoliosis   . Constipation, chronic   . CKD (chronic kidney disease) stage 4, GFR 15-29 ml/min (HCC)   . New onset atrial fibrillation (HCC)     a. diagnosed in 11/2015 - admitted for sepsis  . TIA (transient ischemic attack)     a. multiple TIA's 20+ years ago according to patient's daughter    Patient Active Problem List   Diagnosis Date Noted  . AKI (acute kidney injury) (HCC) 12/13/2015  . Hypoglycemia 12/13/2015  . New onset atrial fibrillation (HCC) 11/22/2015  . Absolute anemia  08/30/2015  . Arthritis, degenerative 08/30/2015  . CKD (chronic kidney disease) stage 4, GFR 15-29 ml/min (HCC) 08/30/2015  . Rheumatoid arthritis (HCC) 08/30/2015  . Essential (primary) hypertension 03/06/2015    Past Surgical History  Procedure Laterality Date  . Hernia repair    . Gallbladder surgery    . Appendectomy    . Ovarian cyst surgery    . Foot surgery Right   . Eye surgery Bilateral   . Breast cyst excision Left   . Cardiac catheterization    . Colonoscopy    . Esophagogastroduodenoscopy endoscopy    . Dilation and curettage of uterus    . Esophageal manometry N/A 05/24/2015    Procedure: ESOPHAGEAL MANOMETRY (EM);  Surgeon: Elnita Maxwell, MD;  Location: [facility] ENDOSCOPY;  Service: Endoscopy;  Laterality: N/A;    No current outpatient prescriptions on file.  Allergies Augmentin; Beta adrenergic blockers; Clindamycin; Excedrin extra strength; Keflex; Meloxicam; Methotrexate sodium; Nsaids; Quinine; and Quinolones  Family History  Problem Relation Age of Onset  . Breast cancer Mother   . CAD Father     Social History Social History  Substance Use Topics  . Smoking status: Never Smoker   . Smokeless tobacco: Never Used  . Alcohol Use: No    Review of Systems Unobtainable  ____________________________________________   PHYSICAL EXAM:  VITAL SIGNS: ED Triage Vitals  Enc Vitals Group     BP 12/13/15 1739 138/86 mmHg     Pulse  Rate 12/13/15 1739 62     Resp --      Temp 12/13/15 1739 95.8 F (35.4 C)     Temp Source 12/13/15 1739 Rectal     SpO2 12/13/15 1739 98 %     Weight 12/13/15 1739 180 lb (81.647 kg)     Height --      Head Cir --      Peak Flow --      Pain Score --      Pain Loc --      Pain Edu? --      Excl. in GC? --     Constitutional:Sleepy but arousable Alert and oriented at least to person and place Well appearing and in no acute distress. Eyes: Conjunctivae are normal. PERRL. EOMI. Head: Atraumatic. Nose: No  congestion/rhinnorhea. Mouth/Throat: Mucous membranes are moist.  Oropharynx non-erythematous. Neck: No stridor.  Cardiovascular: Normal rate, irregular rhythm. Grossly normal heart sounds.  Good peripheral circulation. Respiratory: Normal respiratory effort.  No retractions. Lungs CTAB. Gastrointestinal: Soft and nontender. Rh+ we distended No abdominal bruits. No CVA tenderness. Musculoskeletal: No lower extremity tenderness nor edema.  No joint effusions. Neurologic:  Normal speech and language. No gross focal neurologic deficits are appreciated. No gait instability. Skin:  Skin is warm, dry and intact. No rash noted. Psychiatric: Mood and affect are normal. Speech and behavior are normal.  ____________________________________________   LABS (all labs ordered are listed, but only abnormal results are displayed)  Labs Reviewed  GLUCOSE, CAPILLARY - Abnormal; Notable for the following:    Glucose-Capillary 102 (*)    All other components within normal limits  COMPREHENSIVE METABOLIC PANEL - Abnormal; Notable for the following:    Potassium 5.3 (*)    Glucose, Bld 104 (*)    BUN 52 (*)    Creatinine, Ser 2.80 (*)    Albumin 2.8 (*)    Total Bilirubin 0.2 (*)    GFR calc non Af Amer 14 (*)    GFR calc Af Amer 16 (*)    All other components within normal limits  CBC WITH DIFFERENTIAL/PLATELET - Abnormal; Notable for the following:    RBC 2.36 (*)    Hemoglobin 8.1 (*)    HCT 25.0 (*)    MCV 105.9 (*)    MCH 34.2 (*)    RDW 21.9 (*)    Neutro Abs 7.2 (*)    Lymphs Abs 0.6 (*)    All other components within normal limits  BLOOD GAS, VENOUS - Abnormal; Notable for the following:    pH, Ven 7.30 (*)    Bicarbonate 29.5 (*)    All other components within normal limits  URINALYSIS COMPLETEWITH MICROSCOPIC ([facility] ONLY) - Abnormal; Notable for the following:    Color, Urine YELLOW (*)    APPearance TURBID (*)    Hgb urine dipstick 1+ (*)    Protein, ur 100 (*)    Leukocytes, UA  3+ (*)    Bacteria, UA MANY (*)    All other components within normal limits  CULTURE, BLOOD (ROUTINE X 2)  CULTURE, BLOOD (ROUTINE X 2)  URINE CULTURE  URINE CULTURE  LACTIC ACID, PLASMA  GLUCOSE, CAPILLARY  GLUCOSE, CAPILLARY  LACTIC ACID, PLASMA  TSH  BASIC METABOLIC PANEL  CBC   ____________________________________________  EKG  EKG sinus bradycardia rate of 55 right axis no acute ST-T changes ____________________________________________  RADIOLOGY  CT report had not returned at the time that I gave the chart to the hospitalist.  Chest x-ray showed multifocal infiltrate appears to be a pneumonia. It would be a hospital-acquired pneumonia. ____________________________________________   PROCEDURES    Procedures    ____________________________________________   INITIAL IMPRESSION / ASSESSMENT AND PLAN / ED COURSE  Pertinent labs & imaging results that were available during my care of the patient were reviewed by me and considered in my medical decision making (see chart for details).  Patient catheter because her present Foley would not irrigate or drain. Thick pus comes out. Patient is also somewhat acidotic. Patient is septic. Meropenem was ordered in the hopes that patient would not be allergic to that. Unfortunately this requires a pharmacy consult her that the only way comes up in my computer. Dr. Riki Sheer is aware. ____________________________________________   FINAL CLINICAL IMPRESSION(S) / ED DIAGNOSES  Final diagnoses:  Hypothermia, initial encounter  Healthcare-associated pneumonia  Sepsis, due to unspecified organism [facility])      NEW MEDICATIONS STARTED DURING THIS VISIT:  Current Discharge Medication List       Note:  This document was prepared using Dragon voice recognition software and may include unintentional dictation errors.    Arnaldo Natal, MD 12/13/15 2213  Arnaldo Natal, MD 12/13/15 2214

## 2015-12-13 NOTE — Progress Notes (Signed)
Spoke with DR. crosely pt with glucose of 69.  May increase d5 1/2 normal saline to 75 and give her another amp of d50.

## 2015-12-13 NOTE — Progress Notes (Signed)
Pharmacy Antibiotic Note  Kelsey Chung is a 82 y.o. female admitted on 12/13/2015 with UTI.  Pharmacy has been consulted for meropenem dosing. Patient has numerous antibiotic allergies.   Plan: Meropenem 500 mg IV q12h  Weight: 180 lb (81.647 kg)  Temp (24hrs), Avg:96.2 F (35.7 C), Min:95.6 F (35.3 C), Max:96.7 F (35.9 C)   Recent Labs Lab 12/13/15 1754  WBC 8.4  CREATININE 2.80*  LATICACIDVEN 1.2    Estimated Creatinine Clearance: 12.6 mL/min (by C-G formula based on Cr of 2.8).    Allergies  Allergen Reactions  . Augmentin [Amoxicillin-Pot Clavulanate] Anaphylaxis and Other (See Comments)    Has patient had a PCN reaction causing immediate rash, facial/tongue/throat swelling, SOB or lightheadedness with hypotension: Yes Has patient had a PCN reaction causing severe rash involving mucus membranes or skin necrosis: No Has patient had a PCN reaction that required hospitalization No Has patient had a PCN reaction occurring within the last 10 years: Yes If all of the above answers are "NO", then may proceed with Cephalosporin use.  . Beta Adrenergic Blockers Other (See Comments)    Reaction:  Unknown  . Clindamycin Other (See Comments)    Reaction:  Redness to face/swollen eyes   . Excedrin Extra Strength [Aspirin-Acetaminophen-Caffeine] Diarrhea  . Keflex [Cephalexin] Swelling and Other (See Comments)    Reaction:  Lip swelling   . Meloxicam Other (See Comments)    Reaction:  Decreased renal function  . Methotrexate Sodium Other (See Comments)    Reaction:  Decreased renal function  . Nsaids Other (See Comments)    Reaction:  Unknown   . Quinine Rash  . Quinolones Rash   Antimicrobials this admission: meropenem 7/5 >>   Dose adjustments this admission:  Microbiology results: 7/5 BCx: Sent 7/5 UCx: Sent   Thank you for allowing pharmacy to be a part of this patient's care.  Cindi Carbon, PharmD Clinical Pharmacist 12/13/2015 8:56 PM

## 2015-12-13 NOTE — ED Notes (Signed)
Transporting to room 153-1A

## 2015-12-13 NOTE — ED Notes (Signed)
Ems from home for altered mental status. Per EMS, pts family was unable to wake pt for 6 hours and then called EMS. Blood sugar read "low" for EMS and pt was given 1 amp D50. fsbs 150 post.

## 2015-12-14 ENCOUNTER — Encounter: Payer: Medicare Other | Admitting: Nurse Practitioner

## 2015-12-14 LAB — CBC
HEMATOCRIT: 24.5 % — AB (ref 35.0–47.0)
Hemoglobin: 8 g/dL — ABNORMAL LOW (ref 12.0–16.0)
MCH: 34.9 pg — AB (ref 26.0–34.0)
MCHC: 32.5 g/dL (ref 32.0–36.0)
MCV: 107.2 fL — AB (ref 80.0–100.0)
Platelets: 312 10*3/uL (ref 150–440)
RBC: 2.28 MIL/uL — ABNORMAL LOW (ref 3.80–5.20)
RDW: 22.7 % — ABNORMAL HIGH (ref 11.5–14.5)
WBC: 10.1 10*3/uL (ref 3.6–11.0)

## 2015-12-14 LAB — GLUCOSE, CAPILLARY
GLUCOSE-CAPILLARY: 111 mg/dL — AB (ref 65–99)
GLUCOSE-CAPILLARY: 118 mg/dL — AB (ref 65–99)
GLUCOSE-CAPILLARY: 162 mg/dL — AB (ref 65–99)
GLUCOSE-CAPILLARY: 167 mg/dL — AB (ref 65–99)
GLUCOSE-CAPILLARY: 224 mg/dL — AB (ref 65–99)
GLUCOSE-CAPILLARY: 263 mg/dL — AB (ref 65–99)
GLUCOSE-CAPILLARY: 72 mg/dL (ref 65–99)
Glucose-Capillary: 81 mg/dL (ref 65–99)
Glucose-Capillary: 86 mg/dL (ref 65–99)
Glucose-Capillary: 94 mg/dL (ref 65–99)
Glucose-Capillary: 97 mg/dL (ref 65–99)
Glucose-Capillary: 225 mg/dL — ABNORMAL HIGH (ref 65–99)

## 2015-12-14 LAB — BASIC METABOLIC PANEL
Anion gap: 7 (ref 5–15)
BUN: 51 mg/dL — AB (ref 6–20)
CHLORIDE: 106 mmol/L (ref 101–111)
CO2: 28 mmol/L (ref 22–32)
CREATININE: 2.65 mg/dL — AB (ref 0.44–1.00)
Calcium: 9.4 mg/dL (ref 8.9–10.3)
GFR calc Af Amer: 17 mL/min — ABNORMAL LOW (ref >60)
GFR calc non Af Amer: 15 mL/min — ABNORMAL LOW (ref >60)
GLUCOSE: 71 mg/dL (ref 65–99)
POTASSIUM: 5.2 mmol/L — AB (ref 3.5–5.1)
SODIUM: 141 mmol/L (ref 135–145)

## 2015-12-14 LAB — BLOOD GAS, VENOUS
Acid-Base Excess: 2.5 mmol/L (ref 0.0–3.0)
Bicarbonate: 29.5 mEq/L — ABNORMAL HIGH (ref 21.0–28.0)
PCO2 VEN: 60 mmHg (ref 44.0–60.0)
PH VEN: 7.3 — AB (ref 7.320–7.430)
Patient temperature: 37

## 2015-12-14 MED ORDER — LACTULOSE 10 GM/15ML PO SOLN
20.0000 g | Freq: Every day | ORAL | Status: DC
  Administered 2015-12-15: 20 g via ORAL
  Filled 2015-12-14: qty 30

## 2015-12-14 MED ORDER — OXYCODONE HCL 5 MG PO TABS: 5.0000 mg | ORAL_TABLET | ORAL | Status: DC | PRN

## 2015-12-14 MED ORDER — GABAPENTIN 600 MG PO TABS
300.0000 mg | ORAL_TABLET | Freq: Three times a day (TID) | ORAL | Status: DC
  Administered 2015-12-14 – 2015-12-19 (×13): 300 mg via ORAL
  Filled 2015-12-14 (×7): qty 1
  Filled 2015-12-14: qty 2
  Filled 2015-12-14 (×5): qty 1

## 2015-12-14 NOTE — Progress Notes (Signed)
   Discussed with daughter regarding patient's recent decline in health status.  She confirms patient has been more confused with each admission. Slowly decreasing appetite and weakness. Daughter is hopeful patient will improve with physical therapy at rehabilitation and as the infection clears.  We discussed regarding CODE STATUS. She confirms the patient is DO NOT RESUSCITATE with no CPR or intubation.  >15 minutes spent

## 2015-12-14 NOTE — Clinical Social Work Placement (Signed)
   CLINICAL SOCIAL WORK PLACEMENT  NOTE  Date:  12/14/2015  Patient Details  Name: Kelsey Chung MRN: [id number] Date of Birth: 05-Aug-1923  Clinical Social Work is seeking post-discharge placement for this patient at the Skilled  Nursing Facility level of care (*CSW will initial, date and re-position this form in  chart as items are completed):  Yes   Patient/family provided with [facility] Clinical Social Work Department's list of facilities offering this level of care within the geographic area requested by the patient (or if unable, by the patient's family).  Yes   Patient/family informed of their freedom to choose among providers that offer the needed level of care, that participate in Medicare, Medicaid or managed care program needed by the patient, have an available bed and are willing to accept the patient.  Yes   Patient/family informed of [facility] ownership interest in [facility] and [facility], as well as of the fact that they are under no obligation to receive care at these facilities.  PASRR submitted to EDS on       PASRR number received on       Existing PASRR number confirmed on 12/14/15     FL2 transmitted to all facilities in geographic area requested by pt/family on 12/14/15     FL2 transmitted to all facilities within larger geographic area on       Patient informed that his/her managed care company has contracts with or will negotiate with certain facilities, including the following:            Patient/family informed of bed offers received.  Patient chooses bed at       Physician recommends and patient chooses bed at      Patient to be transferred to   on  .  Patient to be transferred to facility by       Patient family notified on   of transfer.  Name of family member notified:        PHYSICIAN       Additional Comment:    _______________________________________________ Haig Prophet, LCSW 12/14/2015, 1:39 PM

## 2015-12-14 NOTE — [place] FL2 (Signed)
[place] MEDICAID FL2 LEVEL OF CARE SCREENING TOOL     IDENTIFICATION  Patient Name: Kelsey Chung Birthdate: 1923-11-04 Sex: female Admission Date (Current Location): 12/13/2015  [place] and [place] Number:  Chiropodist and Address:  [facility], [address], [place], [place] [address]      Provider Number: [id number]  Attending Physician Name and Address:  Milagros Loll, MD  Relative Name and Phone Number:       Current Level of Care: Hospital Recommended Level of Care: Skilled Nursing Facility Prior Approval Number:    Date Approved/Denied:   PASRR Number:  [id number])  Discharge Plan: SNF    Current Diagnoses: Patient Active Problem List   Diagnosis Date Noted  . AKI (acute kidney injury) (HCC) 12/13/2015  . Hypoglycemia 12/13/2015  . New onset atrial fibrillation (HCC) 11/22/2015  . Absolute anemia 08/30/2015  . Arthritis, degenerative 08/30/2015  . CKD (chronic kidney disease) stage 4, GFR 15-29 ml/min (HCC) 08/30/2015  . Rheumatoid arthritis (HCC) 08/30/2015  . Essential (primary) hypertension 03/06/2015    Orientation RESPIRATION BLADDER Height & Weight     Self  Normal Incontinent, Indwelling catheter Weight: 182 lb 4.8 oz (82.691 kg) Height:  4\' 11"  (149.9 cm)  BEHAVIORAL SYMPTOMS/MOOD NEUROLOGICAL BOWEL NUTRITION STATUS   (none )  (none ) Continent Diet (Diet: Heart Healthy )  AMBULATORY STATUS COMMUNICATION OF NEEDS Skin   Extensive Assist Verbally Normal                       Personal Care Assistance Level of Assistance  Bathing, Feeding, Dressing Bathing Assistance: Limited assistance Feeding assistance: Independent Dressing Assistance: Limited assistance     Functional Limitations Info  Sight, Hearing, Speech Sight Info: Adequate Hearing Info: Impaired Speech Info: Adequate    SPECIAL CARE FACTORS FREQUENCY  PT (By licensed PT), OT (By licensed OT)     PT Frequency:  (5) OT Frequency:   (5)            Contractures      Additional Factors Info  Code Status, Allergies Code Status Info:  (DNR ) Allergies Info:  (Augmentin, Beta Adrenergic Blockers, Clindamycin, Excedrin, Extra Strength, Keflex, Meloxicam, Methotrexate Sodium, Nsaids, Quinine, Quinolones)           Current Medications (12/14/2015):  This is the current hospital active medication list Current Facility-Administered Medications  Medication Dose Route Frequency Provider Last Rate Last Dose  . acetaminophen (TYLENOL) tablet 650 mg  650 mg Oral Q6H PRN 02/14/2016, MD       Or  . acetaminophen (TYLENOL) suppository 650 mg  650 mg Rectal Q6H PRN Wyatt Haste, MD      . allopurinol (ZYLOPRIM) tablet 100 mg  100 mg Oral BID Wyatt Haste, MD   100 mg at 12/14/15 0819  . apixaban (ELIQUIS) tablet 2.5 mg  2.5 mg Oral BID JAZZALYNN RHUDY, RPH   2.5 mg at 12/14/15 02/14/16  . cholecalciferol (VITAMIN D) tablet 1,000 Units  1,000 Units Oral Daily 7124, MD   1,000 Units at 12/14/15 [phone]  . dextrose 5 %-0.45 % sodium chloride infusion   Intravenous Continuous 5809, MD 125 mL/hr at 12/14/15 0944    . diatrizoate meglumine-sodium (GASTROGRAFIN) 66-10 % solution 30 mL  30 mL Oral Once 02/14/16, MD      . diltiazem (CARDIZEM CD) 24 hr capsule 240 mg  240 mg Oral Daily Arnaldo Natal  Hower, MD   240 mg at 12/14/15 0816  . ferrous sulfate tablet 325 mg  325 mg Oral Q breakfast Wyatt Haste, MD   325 mg at 12/14/15 0819  . gabapentin (NEURONTIN) tablet 300 mg  300 mg Oral TID Milagros Loll, MD      . hydroxychloroquine (PLAQUENIL) tablet 200 mg  200 mg Oral Daily Wyatt Haste, MD   200 mg at 12/14/15 0818  . ipratropium-albuterol (DUONEB) 0.5-2.5 (3) MG/3ML nebulizer solution 3 mL  3 mL Nebulization Q4H PRN Wyatt Haste, MD      . Melene Muller ON 12/15/2015] lactulose (CHRONULAC) 10 GM/15ML solution 20 g  20 g Oral Daily Srikar Sudini, MD      . meropenem (MERREM) 500 mg in sodium chloride 0.9 % 50 mL IVPB  500 mg  Intravenous Q12H ETHYL VILA, RPH   500 mg at 12/14/15 9678  . metoprolol tartrate (LOPRESSOR) tablet 12.5 mg  12.5 mg Oral BID Wyatt Haste, MD   12.5 mg at 12/14/15 0818  . omega-3 acid ethyl esters (LOVAZA) capsule 1 g  1 g Oral BID Wyatt Haste, MD   1 g at 12/14/15 0818  . ondansetron (ZOFRAN) tablet 4 mg  4 mg Oral Q6H PRN Wyatt Haste, MD       Or  . ondansetron [facility]) injection 4 mg  4 mg Intravenous Q6H PRN Wyatt Haste, MD      . oxyCODONE (Oxy IR/ROXICODONE) immediate release tablet 5 mg  5 mg Oral Q4H PRN Srikar Sudini, MD      . pantoprazole (PROTONIX) EC tablet 40 mg  40 mg Oral Daily Wyatt Haste, MD   40 mg at 12/14/15 0818  . rOPINIRole (REQUIP) tablet 0.5 mg  0.5 mg Oral QHS Wyatt Haste, MD   0.5 mg at 12/13/15 2221  . sodium chloride flush (NS) 0.9 % injection 3 mL  3 mL Intravenous Q12H Wyatt Haste, MD   3 mL at 12/13/15 2122  . traMADol (ULTRAM) tablet 50 mg  50 mg Oral Q12H PRN Wyatt Haste, MD         Discharge Medications: Please see discharge summary for a list of discharge medications.  Relevant Imaging Results:  Relevant Lab Results:   Additional Information  (SSN: [id number])  Haig Prophet, LCSW

## 2015-12-14 NOTE — Progress Notes (Signed)
Physical Therapy Evaluation Patient Details Name: Kelsey Chung MRN: [id number] DOB: 1923/09/16 Today's Date: 12/14/2015   History of Present Illness  Pt is an 82 yr old female presenting with altered mental status and decreased arousability. Admitted with hypoglycemia, AKI, and recurrent UTI. PMH significant for DM, HTN, RA, gout, OA, AFib, CKD, and h/o TIAs.     Clinical Impression  Pt has been admitted to the hospital 3x in the last month and pt's daughter [name]) reports decreasing strength and mobility with each admission. Prior to this particular admission, pt was unable to ambulate and requiring physical assist x2 for sit <> stand transfers from lift chair <> BSC.  Pt lives in a one-story home with ramped entrance with her daughter and son-in-law, who are her primary caretakers.  Currently, pt is min-mod A x2 for supine > sit transfers and CGA-max A fluctuating to maintain balance sitting EOB.  Pt with audible SOB in sitting and thus further OOB activity was deferred (O2 recovered to 93% by end of session).  Pt presents with decreased functional mobility, decreased strength, and decreased balance which lead to increased caregiver burden and increased risk for falls. Pt would benefit from skilled PT to address noted impairments and functional limitations.  Recommend pt discharge to SNF when medically appropriate.     Follow Up Recommendations SNF    Equipment Recommendations   (pt has equipment at home)    Recommendations for Other Services       Precautions / Restrictions Precautions Precautions: Fall Restrictions Weight Bearing Restrictions: No      Mobility  Bed Mobility Overal bed mobility: Needs Assistance Bed Mobility: Supine to Sit;Sit to Supine     Supine to sit: Min assist;Mod assist;+2 for physical assistance Sit to supine: Max assist;+2 for physical assistance   General bed mobility comments: Pt able to slide bilat LE's off EOB, but requires min-mod A x2 for  hip and trunk management. Pt requiring fluctuating assist to maintain sitting EOB CGA up to max A. In sitting, pt with audible SOB. Telemetry reading O2 at 74%, though RN and PT question the accuracy of this sensor. Tried measuring with a different monitor, but unable to get a good reading. Pt returned to supine with max A x2. With increased time and warming of pt's fingers, O2 recovered to 93%. RN aware.   Transfers General transfer comment: Did not assess  Ambulation/Gait General Gait Details: Did not assess  Stairs    Wheelchair Mobility    Modified Rankin (Stroke Patients Only)       Balance Overall balance assessment: Needs assistance Sitting-balance support: Bilateral upper extremity supported;Feet supported Sitting balance-Leahy Scale: Poor Sitting balance - Comments: Fluctuating assist required to maintain sitting EOB (CGA up to max A) Postural control: Posterior lean     Pertinent Vitals/Pain Pain Assessment: No/denies pain  HR maintained WFL throughout session. O2 was 94% upon room entry. Sitting EOB, pt with audible SOB. Telemetry reading 74% though unsure of the accuracy of this reading. Pt returned to supine and SaO2 noted to be 93%. RN aware.    Home Living Family/patient expects to be discharged to:: Private residence Living Arrangements: Children Available Help at Discharge: Family Type of Home: House Home Access: Ramped entrance Home Layout: One level Home Equipment: Gilmer Mor - quad;Walker - 4 wheels;Bedside commode;Shower seat - built in;Grab bars - tub/shower;Transport chair     Prior Function Level of Independence: Needs assistance  Comments: Per pt's daughter, pt has not ambulated since last  admission. Pt sleeps in her lift chair, and has been requiring physical assist x2 for transfers lift chair <> BSC.        Extremity/Trunk Assessment   Upper Extremity Assessment: Generalized weakness  Lower Extremity Assessment: Generalized weakness (B LE at  least 3/5) Light touch sensation in tact bilat   Cervical / Trunk Assessment: Kyphotic    Communication   Communication: HOH  Cognition Arousal/Alertness: Awake/alert Behavior During Therapy: WFL for tasks assessed/performed Overall Cognitive Status: Within Functional Limits for tasks assessed    General Comments General comments (skin integrity, edema, etc.): Bloody drainage from dressing at R elbow. RN notified.    Exercises General Exercises - Lower Extremity Ankle Circles/Pumps: AROM Quad Sets: AROM Short Arc Quad: AROM Heel Slides: AROM Hip ABduction/ADduction: AROM Straight Leg Raises: AROM  All exercises performed bilaterally x 10 reps in supine.      Assessment/Plan    PT Assessment Patient needs continued PT services  PT Diagnosis Difficulty walking;Generalized weakness   PT Problem List Decreased strength;Decreased activity tolerance;Decreased balance;Decreased mobility;Decreased coordination;Decreased knowledge of use of DME  PT Treatment Interventions DME instruction;Gait training;Functional mobility training;Therapeutic activities;Therapeutic exercise;Balance training;Patient/family education   PT Goals (Current goals can be found in the Care Plan section) Acute Rehab PT Goals Patient Stated Goal: to get stronger PT Goal Formulation: With patient/family Time For Goal Achievement: 12/28/15 Potential to Achieve Goals: Fair    Frequency Min 2X/week   Barriers to discharge Assist levels         End of Session   Activity Tolerance: Patient limited by fatigue (, SOB) Patient left: in bed;with call bell/phone within reach;with bed alarm set;with family/visitor present Nurse Communication: Mobility status         Time: 7829-5621 PT Time Calculation (min) (ACUTE ONLY): 30 min   Charges:         PT G Codes:        Mikale Silversmith, SPT 12/14/2015, 3:47 PM

## 2015-12-14 NOTE — Consult Note (Signed)
[facility] Infectious Disease     Reason for Consult:UTI    Referring Physician:Sudini, S Date of Admission:  12/13/2015   Active Problems:   AKI (acute kidney injury) ([place])   Hypoglycemia   HPI: Kelsey Chung is a 82 y.o. female readmitted 7/5 after her daughter could not awaken her.  Per her daughter the pt had not slept well the night prior then went to urology for fu after recent admission. The patient has been continually weak since dc on 6/24 but has been getting PT. Since she was not yet up and walking decision was made to leave her foley in for another 3 weeks. When they returned home from the urology visit patient was tired and went to sleep. However when PT arrived she was unarousable. EMT came and found sugars in 20s. She was given what sounds like D 50 and woke up.  Brought to ED where found to be dehydrated with cr 2.8 (had been 1.25 at DC 12 days prior) and BUN 52. WBC was 8 and she was afebrile.   She had recent admission for urinary frequency, poor po intake, abd pain, vaginal iritaion. At that time ED cath placed and had 500 cc cloudy urine. UCX was neg, treated with several days aztreonam and then dced on bactrim.  Also priir admit and dcd 6/18 on bactrim following several days of aztreonam.   All urine cultures unimpressive over these admits Currenly back to baseline- alert, feeding herself   Past Medical History  Diagnosis Date  . Diabetes ([place])   . Hypertension   . Rheumatoid arthritis(714.0)   . Hyperlipidemia   . Gout   . Osteoarthritis (arthritis due to wear and tear of joints)   . Scoliosis   . Constipation, chronic   . CKD (chronic kidney disease) stage 4, GFR 15-29 ml/min (HCC)   . New onset atrial fibrillation ([place])     a. diagnosed in 11/2015 - admitted for sepsis  . TIA (transient ischemic attack)     a. multiple TIA's 20+ years ago according to patient's daughter   Past Surgical History  Procedure Laterality Date  . Hernia repair    .  Gallbladder surgery    . Appendectomy    . Ovarian cyst surgery    . Foot surgery Right   . Eye surgery Bilateral   . Breast cyst excision Left   . Cardiac catheterization    . Colonoscopy    . Esophagogastroduodenoscopy endoscopy    . Dilation and curettage of uterus    . Esophageal manometry N/A 05/24/2015    Procedure: ESOPHAGEAL MANOMETRY (EM);  Surgeon: Josefine Class, MD;  Location: [facility] ENDOSCOPY;  Service: Endoscopy;  Laterality: N/A;   Social History  Substance Use Topics  . Smoking status: Never Smoker   . Smokeless tobacco: Never Used  . Alcohol Use: No   Family History  Problem Relation Age of Onset  . Breast cancer Mother   . CAD Father     Allergies:  Allergies  Allergen Reactions  . Augmentin [Amoxicillin-Pot Clavulanate] Anaphylaxis and Other (See Comments)    Has patient had a PCN reaction causing immediate rash, facial/tongue/throat swelling, SOB or lightheadedness with hypotension: Yes Has patient had a PCN reaction causing severe rash involving mucus membranes or skin necrosis: No Has patient had a PCN reaction that required hospitalization No Has patient had a PCN reaction occurring within the last 10 years: Yes If all of the above answers are "NO", then may  proceed with Cephalosporin use.  . Beta Adrenergic Blockers Other (See Comments)    Reaction:  Unknown  . Clindamycin Other (See Comments)    Reaction:  Redness to face/swollen eyes   . Excedrin Extra Strength [Aspirin-Acetaminophen-Caffeine] Diarrhea  . Keflex [Cephalexin] Swelling and Other (See Comments)    Reaction:  Lip swelling   . Meloxicam Other (See Comments)    Reaction:  Decreased renal function  . Methotrexate Sodium Other (See Comments)    Reaction:  Decreased renal function  . Nsaids Other (See Comments)    Reaction:  Unknown   . Quinine Rash  . Quinolones Rash    Current antibiotics: Antibiotics Given (last 72 hours)    Date/Time Action Medication Dose Rate   12/14/15  0818 Given   hydroxychloroquine (PLAQUENIL) tablet 200 mg 200 mg    12/14/15 0821 Given   meropenem (MERREM) 500 mg in sodium chloride 0.9 % 50 mL IVPB 500 mg 100 mL/hr      MEDICATIONS: . allopurinol  100 mg Oral BID  . apixaban  2.5 mg Oral BID  . cholecalciferol  1,000 Units Oral Daily  . diatrizoate meglumine-sodium  30 mL Oral Once  . diltiazem  240 mg Oral Daily  . ferrous sulfate  325 mg Oral Q breakfast  . gabapentin  300 mg Oral TID  . hydroxychloroquine  200 mg Oral Daily  . [START ON 12/15/2015] lactulose  20 g Oral Daily  . meropenem (MERREM) IV  500 mg Intravenous Q12H  . metoprolol tartrate  12.5 mg Oral BID  . omega-3 acid ethyl esters  1 g Oral BID  . pantoprazole  40 mg Oral Daily  . rOPINIRole  0.5 mg Oral QHS  . sodium chloride flush  3 mL Intravenous Q12H    Review of Systems - 11 systems reviewed and negative per HPI   OBJECTIVE: Temp:  [95.6 F (35.3 C)-99 F (37.2 C)] 98.2 F (36.8 C) (07/06 1449) Pulse Rate:  [52-78] 78 (07/06 1449) Resp:  [14-25] 18 (07/06 1449) BP: (104-144)/(40-109) 141/58 mmHg (07/06 1449) SpO2:  [92 %-100 %] 92 % (07/06 1449) Weight:  [81.647 kg (180 lb)-82.691 kg (182 lb 4.8 oz)] 82.691 kg (182 lb 4.8 oz) (07/05 2105) Physical Exam  Constitutional: frail, lying in bed but is awake, feeding her self HENT: [place]/AT, PERRLA, no scleral icterus Mouth/Throat: Oropharynx is clear and dry . No oropharyngeal exudate.  Cardiovascular: Normal rate, regular rhythm and normal heart sounds.  Pulmonary/Chest: Effort normal and breath sounds normal. Abdominal: Soft. Bowel sounds are normal.  exhibits no distension. There is no tenderness.  Lymphadenopathy: no cervical adenopathy. No axillary adenopathy Neurological: alert and oriented to person, place, and time.  Skin: Skin is warm and dry. No rash noted. No erythema. \ Psychiatric: a normal mood and affect.  behavior is normal.    LABS: Results for orders placed or performed during the  hospital encounter of 12/13/15 (from the past 48 hour(s))  Urinalysis complete, with microscopic ([facility] only)     Status: Abnormal   Collection Time: 12/13/15  5:27 PM  Result Value Ref Range   Color, Urine YELLOW (A) YELLOW   APPearance TURBID (A) CLEAR   Glucose, UA NEGATIVE NEGATIVE mg/dL   Bilirubin Urine NEGATIVE NEGATIVE   Ketones, ur NEGATIVE NEGATIVE mg/dL   Specific Gravity, Urine 1.016 1.005 - 1.030   Hgb urine dipstick 1+ (A) NEGATIVE   pH 5.0 5.0 - 8.0   Protein, ur 100 (A) NEGATIVE mg/dL  Nitrite NEGATIVE NEGATIVE   Leukocytes, UA 3+ (A) NEGATIVE   RBC / HPF TOO NUMEROUS TO COUNT 0 - 5 RBC/hpf   WBC, UA TOO NUMEROUS TO COUNT 0 - 5 WBC/hpf   Bacteria, UA MANY (A) NONE SEEN   Squamous Epithelial / LPF NONE SEEN NONE SEEN   WBC Clumps PRESENT    Budding Yeast PRESENT    Hyaline Casts, UA PRESENT   Glucose, capillary     Status: Abnormal   Collection Time: 12/13/15  5:46 PM  Result Value Ref Range   Glucose-Capillary 102 (H) 65 - 99 mg/dL  Comprehensive metabolic panel     Status: Abnormal   Collection Time: 12/13/15  5:54 PM  Result Value Ref Range   Sodium 139 135 - 145 mmol/L   Potassium 5.3 (H) 3.5 - 5.1 mmol/L   Chloride 104 101 - 111 mmol/L   CO2 28 22 - 32 mmol/L   Glucose, Bld 104 (H) 65 - 99 mg/dL   BUN 52 (H) 6 - 20 mg/dL   Creatinine, Ser 2.80 (H) 0.44 - 1.00 mg/dL   Calcium 9.5 8.9 - 10.3 mg/dL   Total Protein 6.7 6.5 - 8.1 g/dL   Albumin 2.8 (L) 3.5 - 5.0 g/dL   AST 22 15 - 41 U/L   ALT 15 14 - 54 U/L   Alkaline Phosphatase 85 38 - 126 U/L   Total Bilirubin 0.2 (L) 0.3 - 1.2 mg/dL   GFR calc non Af Amer 14 (L) >60 mL/min   GFR calc Af Amer 16 (L) >60 mL/min    Comment: (NOTE) The eGFR has been calculated using the CKD EPI equation. This calculation has not been validated in all clinical situations. eGFR's persistently <60 mL/min signify possible Chronic Kidney Disease.    Anion gap 7 5 - 15  CBC WITH DIFFERENTIAL     Status: Abnormal    Collection Time: 12/13/15  5:54 PM  Result Value Ref Range   WBC 8.4 3.6 - 11.0 K/uL   RBC 2.36 (L) 3.80 - 5.20 MIL/uL   Hemoglobin 8.1 (L) 12.0 - 16.0 g/dL   HCT 25.0 (L) 35.0 - 47.0 %   MCV 105.9 (H) 80.0 - 100.0 fL   MCH 34.2 (H) 26.0 - 34.0 pg   MCHC 32.3 32.0 - 36.0 g/dL   RDW 21.9 (H) 11.5 - 14.5 %   Platelets 291 150 - 440 K/uL   Neutrophils Relative % 85 %   Neutro Abs 7.2 (H) 1.4 - 6.5 K/uL   Lymphocytes Relative 7 %   Lymphs Abs 0.6 (L) 1.0 - 3.6 K/uL   Monocytes Relative 6 %   Monocytes Absolute 0.5 0.2 - 0.9 K/uL   Eosinophils Relative 1 %   Eosinophils Absolute 0.0 0 - 0.7 K/uL   Basophils Relative 1 %   Basophils Absolute 0.1 0 - 0.1 K/uL  Blood Culture (routine x 2)     Status: None (Preliminary result)   Collection Time: 12/13/15  5:54 PM  Result Value Ref Range   Specimen Description BLOOD LEFT HAND    Special Requests      BOTTLES DRAWN AEROBIC AND ANAEROBIC  AERO [place] ANA 4CC   Culture NO GROWTH < 12 HOURS    Report Status PENDING   Blood Culture (routine x 2)     Status: None (Preliminary result)   Collection Time: 12/13/15  5:54 PM  Result Value Ref Range   Specimen Description BLOOD RIGHT ASSIST CONTROL  Special Requests      BOTTLES DRAWN AEROBIC AND ANAEROBIC  AERO [place] ANA 8CC   Culture NO GROWTH < 12 HOURS    Report Status PENDING   Lactic acid, plasma     Status: None   Collection Time: 12/13/15  5:54 PM  Result Value Ref Range   Lactic Acid, Venous 1.2 0.5 - 1.9 mmol/L  TSH     Status: None   Collection Time: 12/13/15  5:54 PM  Result Value Ref Range   TSH 2.191 0.350 - 4.500 uIU/mL  Blood gas, venous ([facility], [facility], [facility])     Status: Abnormal   Collection Time: 12/13/15  6:01 PM  Result Value Ref Range   pH, Ven 7.30 (L) 7.320 - 7.430   pCO2, Ven 60 44.0 - 60.0 mmHg   Bicarbonate 29.5 (H) 21.0 - 28.0 mEq/L   Acid-Base Excess 2.5 0.0 - 3.0 mmol/L   Patient temperature 37.0    Collection site VEIN   Glucose, capillary     Status: None    Collection Time: 12/13/15  6:32 PM  Result Value Ref Range   Glucose-Capillary 81 65 - 99 mg/dL  Glucose, capillary     Status: None   Collection Time: 12/13/15  9:29 PM  Result Value Ref Range   Glucose-Capillary 69 65 - 99 mg/dL   Comment 1 Notify RN   Lactic acid, plasma     Status: None   Collection Time: 12/13/15 10:35 PM  Result Value Ref Range   Lactic Acid, Venous 1.4 0.5 - 1.9 mmol/L  Glucose, capillary     Status: Abnormal   Collection Time: 12/13/15 11:39 PM  Result Value Ref Range   Glucose-Capillary 127 (H) 65 - 99 mg/dL   Comment 1 Notify RN   Glucose, capillary     Status: Abnormal   Collection Time: 12/14/15  1:47 AM  Result Value Ref Range   Glucose-Capillary 118 (H) 65 - 99 mg/dL   Comment 1 Notify RN   Basic metabolic panel     Status: Abnormal   Collection Time: 12/14/15  3:45 AM  Result Value Ref Range   Sodium 141 135 - 145 mmol/L   Potassium 5.2 (H) 3.5 - 5.1 mmol/L   Chloride 106 101 - 111 mmol/L   CO2 28 22 - 32 mmol/L   Glucose, Bld 71 65 - 99 mg/dL   BUN 51 (H) 6 - 20 mg/dL   Creatinine, Ser 2.65 (H) 0.44 - 1.00 mg/dL   Calcium 9.4 8.9 - 10.3 mg/dL   GFR calc non Af Amer 15 (L) >60 mL/min   GFR calc Af Amer 17 (L) >60 mL/min    Comment: (NOTE) The eGFR has been calculated using the CKD EPI equation. This calculation has not been validated in all clinical situations. eGFR's persistently <60 mL/min signify possible Chronic Kidney Disease.    Anion gap 7 5 - 15  CBC     Status: Abnormal   Collection Time: 12/14/15  3:45 AM  Result Value Ref Range   WBC 10.1 3.6 - 11.0 K/uL   RBC 2.28 (L) 3.80 - 5.20 MIL/uL   Hemoglobin 8.0 (L) 12.0 - 16.0 g/dL   HCT 24.5 (L) 35.0 - 47.0 %   MCV 107.2 (H) 80.0 - 100.0 fL   MCH 34.9 (H) 26.0 - 34.0 pg   MCHC 32.5 32.0 - 36.0 g/dL   RDW 22.7 (H) 11.5 - 14.5 %   Platelets 312 150 - 440 K/uL  Glucose,  capillary     Status: None   Collection Time: 12/14/15  3:54 AM  Result Value Ref Range   Glucose-Capillary  81 65 - 99 mg/dL   Comment 1 Notify RN   Glucose, capillary     Status: None   Collection Time: 12/14/15  6:12 AM  Result Value Ref Range   Glucose-Capillary 72 65 - 99 mg/dL   Comment 1 Notify RN   Glucose, capillary     Status: None   Collection Time: 12/14/15  6:58 AM  Result Value Ref Range   Glucose-Capillary 86 65 - 99 mg/dL  Glucose, capillary     Status: None   Collection Time: 12/14/15  7:46 AM  Result Value Ref Range   Glucose-Capillary 94 65 - 99 mg/dL   Comment 1 Notify RN   Glucose, capillary     Status: None   Collection Time: 12/14/15 10:06 AM  Result Value Ref Range   Glucose-Capillary 97 65 - 99 mg/dL   Comment 1 Notify RN   Glucose, capillary     Status: Abnormal   Collection Time: 12/14/15 11:57 AM  Result Value Ref Range   Glucose-Capillary 111 (H) 65 - 99 mg/dL   Comment 1 Notify RN   Glucose, capillary     Status: Abnormal   Collection Time: 12/14/15  1:50 PM  Result Value Ref Range   Glucose-Capillary 162 (H) 65 - 99 mg/dL   Comment 1 Notify RN    No components found for: ESR, C REACTIVE PROTEIN MICRO: Recent Results (from the past 720 hour(s))  Urine culture     Status: None   Collection Time: 11/21/15  7:45 PM  Result Value Ref Range Status   Specimen Description URINE, CATHETERIZED  Final   Special Requests NONE  Final   Culture NO GROWTH Performed at [facility]   Final   Report Status 11/23/2015 FINAL  Final  Blood Culture (routine x 2)     Status: Abnormal   Collection Time: 11/21/15  7:50 PM  Result Value Ref Range Status   Specimen Description BLOOD RIGHT FOREARM  Final   Special Requests BOTTLES DRAWN AEROBIC AND ANAEROBIC  0.5CC  Final   Culture  Setup Time   Final    GRAM POSITIVE RODS AEROBIC BOTTLE ONLY CRITICAL RESULT CALLED TO, READ BACK BY AND VERIFIED WITH: LEAH LEE 11/24/15 1000 BY SJL    Culture (A)  Final    DIPHTHEROIDS(CORYNEBACTERIUM SPECIES) Standardized susceptibility testing for this organism is not  available. Performed at [facility]    Report Status 11/26/2015 FINAL  Final  Blood Culture (routine x 2)     Status: Abnormal   Collection Time: 11/21/15  7:50 PM  Result Value Ref Range Status   Specimen Description BLOOD LEFT WRIST  Final   Special Requests BOTTLES DRAWN AEROBIC AND ANAEROBIC  0.5CC  Final   Culture  Setup Time   Final    GRAM POSITIVE COCCI IN BOTH AEROBIC AND ANAEROBIC BOTTLES CRITICAL RESULT CALLED TO, READ BACK BY AND VERIFIED WITH: MATT MCBANE @ 6629 ON 11/22/2015 BY [facility]F CONFIRMED BY TLB    Culture (A)  Final    STAPHYLOCOCCUS SPECIES (COAGULASE NEGATIVE) THE SIGNIFICANCE OF ISOLATING THIS ORGANISM FROM A SINGLE SET OF BLOOD CULTURES WHEN MULTIPLE SETS ARE DRAWN IS UNCERTAIN. PLEASE NOTIFY THE MICROBIOLOGY DEPARTMENT WITHIN ONE WEEK IF SPECIATION AND SENSITIVITIES ARE REQUIRED. Performed at [facility]    Report Status 11/27/2015 FINAL  Final  Blood  Culture ID Panel (Reflexed)     Status: Abnormal   Collection Time: 11/21/15  7:50 PM  Result Value Ref Range Status   Enterococcus species NOT DETECTED NOT DETECTED Final   Vancomycin resistance NOT DETECTED NOT DETECTED Final   Listeria monocytogenes NOT DETECTED NOT DETECTED Final   Staphylococcus species DETECTED (A) NOT DETECTED Final    Comment: CRITICAL RESULT CALLED TO, READ BACK BY AND VERIFIED WITH: MATT MCBANE @ 5498 ON 11/22/2015 BY CAF    Staphylococcus aureus NOT DETECTED NOT DETECTED Final   Methicillin resistance DETECTED (A) NOT DETECTED Final    Comment: CRITICAL RESULT CALLED TO, READ BACK BY AND VERIFIED WITH: MATT MCBANE @ 2641 ON 11/22/2015 BY CAF    Streptococcus species NOT DETECTED NOT DETECTED Final   Streptococcus agalactiae NOT DETECTED NOT DETECTED Final   Streptococcus pneumoniae NOT DETECTED NOT DETECTED Final   Streptococcus pyogenes NOT DETECTED NOT DETECTED Final   Acinetobacter baumannii NOT DETECTED NOT DETECTED Final   Enterobacteriaceae species NOT  DETECTED NOT DETECTED Final   Enterobacter cloacae complex NOT DETECTED NOT DETECTED Final   Escherichia coli NOT DETECTED NOT DETECTED Final   Klebsiella oxytoca NOT DETECTED NOT DETECTED Final   Klebsiella pneumoniae NOT DETECTED NOT DETECTED Final   Proteus species NOT DETECTED NOT DETECTED Final   Serratia marcescens NOT DETECTED NOT DETECTED Final   Carbapenem resistance NOT DETECTED NOT DETECTED Final   Haemophilus influenzae NOT DETECTED NOT DETECTED Final   Neisseria meningitidis NOT DETECTED NOT DETECTED Final   Pseudomonas aeruginosa NOT DETECTED NOT DETECTED Final   Candida albicans NOT DETECTED NOT DETECTED Final   Candida glabrata NOT DETECTED NOT DETECTED Final   Candida krusei NOT DETECTED NOT DETECTED Final   Candida parapsilosis NOT DETECTED NOT DETECTED Final   Candida tropicalis NOT DETECTED NOT DETECTED Final  Urine culture     Status: Abnormal   Collection Time: 11/24/15  4:32 PM  Result Value Ref Range Status   Specimen Description URINE, RANDOM  Final   Special Requests NONE  Final   Culture (A)  Final    <10,000 COLONIES/mL INSIGNIFICANT GROWTH Performed at [facility]    Report Status 11/26/2015 FINAL  Final  Urine culture     Status: None   Collection Time: 11/28/15  2:23 PM  Result Value Ref Range Status   Specimen Description URINE, RANDOM  Final   Special Requests NONE  Final   Culture NO GROWTH Performed at [facility]   Final   Report Status 11/29/2015 FINAL  Final  Blood culture (routine x 2)     Status: None   Collection Time: 11/28/15  2:45 PM  Result Value Ref Range Status   Specimen Description BLOOD LEFT FA  Final   Special Requests   Final    BOTTLES DRAWN AEROBIC AND ANAEROBIC AER 4ML ANA 3ML   Culture NO GROWTH 5 DAYS  Final   Report Status 12/03/2015 FINAL  Final  Blood culture (routine x 2)     Status: None   Collection Time: 11/28/15  2:50 PM  Result Value Ref Range Status   Specimen Description BLOOD RIGHT AC   Final   Special Requests   Final    BOTTLES DRAWN AEROBIC AND ANAEROBIC AER 7ML ANA 6ML   Culture NO GROWTH 5 DAYS  Final   Report Status 12/03/2015 FINAL  Final  Wet prep, genital     Status: Abnormal   Collection Time: 11/30/15  5:29 PM  Result Value Ref Range Status   Yeast Wet Prep HPF POC NONE SEEN NONE SEEN Final   Trich, Wet Prep NONE SEEN NONE SEEN Final   Clue Cells Wet Prep HPF POC NONE SEEN NONE SEEN Final   WBC, Wet Prep HPF POC MANY (A) NONE SEEN Final   Sperm NONE SEEN  Final  Blood Culture (routine x 2)     Status: None (Preliminary result)   Collection Time: 12/13/15  5:54 PM  Result Value Ref Range Status   Specimen Description BLOOD LEFT HAND  Final   Special Requests   Final    BOTTLES DRAWN AEROBIC AND ANAEROBIC  AERO [place] ANA 4CC   Culture NO GROWTH < 12 HOURS  Final   Report Status PENDING  Incomplete  Blood Culture (routine x 2)     Status: None (Preliminary result)   Collection Time: 12/13/15  5:54 PM  Result Value Ref Range Status   Specimen Description BLOOD RIGHT ASSIST CONTROL  Final   Special Requests   Final    BOTTLES DRAWN AEROBIC AND ANAEROBIC  AERO [place] ANA [place]   Culture NO GROWTH < 12 HOURS  Final   Report Status PENDING  Incomplete    IMAGING: Ct Abdomen Pelvis Wo Contrast  12/13/2015  CLINICAL DATA:  Altered mental status, distended abdomen, hypoglycemia, diabetes mellitus, hypertension EXAM: CT ABDOMEN AND PELVIS WITHOUT CONTRAST TECHNIQUE: Multidetector CT imaging of the abdomen and pelvis was performed following the standard protocol without IV contrast. Sagittal and coronal MPR images reconstructed from axial data set. COMPARISON:  11/28/2015 FINDINGS: Lower chest:  Bibasilar pleural effusions and atelectasis. Hepatobiliary: Gallbladder surgically absent. No focal hepatic abnormalities. Pancreas: Atrophic, otherwise unremarkable Spleen: Normal appearance Adrenals/Urinary Tract: Adrenal glands normal appearance vascular calcifications a kidneys.  BILATERAL cortical renal atrophy. No mass or hydronephrosis. Foley catheter within urinary bladder with intravesicular air as well. Questionable wall thickening versus dependent density at posterior LEFT bladder base Stomach/Bowel: Appendix surgically absent by history. Stomach decompressed, unable to accurately assess wall thickness. Large and small bowel loops unremarkable. No bowel dilatation or bowel wall thickening. Vascular/Lymphatic: Scattered atherosclerotic calcifications aorta, iliac arteries, abdominal branch vessels and aortic root. Mitral annular calcification. Reproductive: Uterine calcification likely a degenerated leiomyoma. Otherwise unremarkable uterus and adnexa Other: Nonspecific presacral density. No free intraperitoneal air or fluid. No hernia. Musculoskeletal: Osseous demineralization with degenerative disc and facet disease changes lumbar spine associated with scoliosis. IMPRESSION: No definite acute intra-abdominal or intrapelvic abnormalities. Bibasilar pleural effusions and atelectasis. Aortic atherosclerosis. Electronically Signed   By: Lavonia Dana M.D.   On: 12/13/2015 19:16   Dg Chest 1 View  11/28/2015  CLINICAL DATA:  Chest pain today. EXAM: CHEST 1 VIEW COMPARISON:  11/22/2015 FINDINGS: The heart is enlarged but stable. There is tortuosity and calcification of the thoracic aorta. The lungs demonstrate mild chronic bronchitic changes and streaky atelectasis but no definite infiltrates or effusions. The bony thorax is intact. IMPRESSION: 1. Chronic bronchitic changes and streaky areas of subsegmental atelectasis but no definite infiltrates or effusions. 2. Stable mild cardiac enlargement. 3. Stable aortic atherosclerosis. Electronically Signed   By: Marijo Sanes M.D.   On: 11/28/2015 15:02   Dg Chest 1 View  11/22/2015  CLINICAL DATA:  Acute onset of generalized weakness. Code sepsis. Initial encounter. EXAM: CHEST 1 VIEW COMPARISON:  Chest radiograph performed 11/21/2015  FINDINGS: The lungs are well-aerated. Mild bibasilar airspace opacities may reflect mild pneumonia. There is no evidence of pleural effusion or pneumothorax. The cardiomediastinal  silhouette is enlarged. No acute osseous abnormalities are seen. IMPRESSION: 1. Mild bibasilar opacities may reflect mild pneumonia. 2. Cardiomegaly. Electronically Signed   By: Garald Balding M.D.   On: 11/22/2015 18:32   Ct Head Wo Contrast  12/13/2015  CLINICAL DATA:  Altered mental status and abdominal distention. EXAM: CT HEAD WITHOUT CONTRAST TECHNIQUE: Contiguous axial images were obtained from the base of the skull through the vertex without intravenous contrast. COMPARISON:  Head CT 11/30/2015. FINDINGS: There is some atrophy and chronic microvascular change. No acute abnormality including hemorrhage, infarct, mass lesion, mass effect, midline shift or abnormal extra axial fluid collection is identified. Imaged paranasal sinuses and mastoid air cells are clear. The calvarium is intact. IMPRESSION: No acute abnormality. Atrophy and chronic microvascular ischemic change. Electronically Signed   By: Inge Rise M.D.   On: 12/13/2015 19:16   Ct Head Wo Contrast  11/30/2015  CLINICAL DATA:  Bilateral upper extremity weakness right worse than left. EXAM: CT HEAD WITHOUT CONTRAST TECHNIQUE: Contiguous axial images were obtained from the base of the skull through the vertex without intravenous contrast. COMPARISON:  07/25/2006 FINDINGS: The ventricles, cisterns and other CSF spaces are within normal. There is mild-to-moderate chronic ischemic microvascular disease. No evidence of mass, mass effect, shift of midline structures or acute hemorrhage. No evidence of acute infarction. Remaining bones and soft tissues are within normal. IMPRESSION: No acute intracranial findings. Chronic ischemic microvascular disease. Electronically Signed   By: Marin Olp M.D.   On: 11/30/2015 16:52   Dg Chest Portable 1 View  12/13/2015   CLINICAL DATA:  Altered mental status EXAM: PORTABLE CHEST 1 VIEW COMPARISON:  11/28/2015 FINDINGS: 1905 hours. Low volume film. Patchy opacities are noted in the lungs bilaterally, right greater than left. The cardio pericardial silhouette is enlarged. The visualized bony structures of the thorax are intact. Telemetry leads overlie the chest. IMPRESSION: Interval development of bilateral patchy airspace opacities. Given the rapid interval appearance, infectious/inflammatory process is suspected. Dedicated upright two view chest x-ray may prove helpful to further evaluate. Cardiomegaly. Electronically Signed   By: Misty Stanley M.D.   On: 12/13/2015 19:10   Dg Chest Port 1 View  11/21/2015  CLINICAL DATA:  Family states pt ate some ice cream tonight then went to the bathroom and became weak with uncontrolled shaking. New onset of Afib. History of HTN, stroke, diabetic EXAM: PORTABLE CHEST 1 VIEW COMPARISON:  01/11/2014 FINDINGS: Cardiac silhouette is mildly enlarged. No mediastinal or hilar masses or evidence of adenopathy. Clear lungs. No pleural effusion or pneumothorax. The bony thorax is demineralized but grossly intact. IMPRESSION: No acute cardiopulmonary disease. Electronically Signed   By: Lajean Manes M.D.   On: 11/21/2015 20:05   Ct Renal Stone Study  11/28/2015  CLINICAL DATA:  Worsening weakness and lethargy status post treated urinary sepsis. Abdominal pain. Difficulty urinating. EXAM: CT ABDOMEN AND PELVIS WITHOUT CONTRAST TECHNIQUE: Multidetector CT imaging of the abdomen and pelvis was performed following the standard protocol without IV contrast. COMPARISON:  Renal ultrasound 01/12/2014 FINDINGS: Lower chest: Evaluation of lung parenchyma is precluded by breathing motion artifact. There are bilateral small pleural effusions with associated compressive bibasilar atelectasis. Mitral and aortic valve annular calcifications. Hepatobiliary: No mass visualized on this un-enhanced exam.  Postcholecystectomy. Pancreas: No mass or inflammatory process identified on this un-enhanced exam. Spleen: Within normal limits in size. Adrenals/Urinary Tract: No evidence of urolithiasis or hydronephrosis. No definite mass visualized on this un-enhanced exam. Mildly atrophic kidneys. Stomach/Bowel: No evidence of obstruction,  inflammatory process, or abnormal fluid collections. Vascular/Lymphatic: No pathologically enlarged lymph nodes. No evidence of abdominal aortic aneurysm. Mild diffuse mesenteric stranding. Advanced calcific atherosclerotic disease of the aorta and its main attributes. Reproductive: No mass or other significant abnormality. The urinary bladder is not well evaluated as it is decompressed around urinary Foley. Other: Chest wall edema. Musculoskeletal: Reversed S shaped thoraco lumbar spine scoliosis with advanced osteoarthritic changes. No suspicious osseous lesions. IMPRESSION: Bilateral pleural effusions with bibasilar hypoventilatory changes. No evidence of acute abnormality within the solid abdominal organs. Somewhat atrophic kidneys. Diffuse mesenteric stranding and chest and abdominal wall edema, likely due to third spacing. Advanced calcific atherosclerotic disease of the aorta. Mitral and aortic valve annular calcifications. Electronically Signed   By: Fidela Salisbury M.D.   On: 11/28/2015 16:04    Assessment:   Kelsey Chung is a 82 y.o. female with recurrent admisison with dehydration and sterile pyuria.   UA on admission (as well as prior admissions) with TNTC wbc but several prior cxs negative. CT with no stones or hydronephrosis however has bladder wall thickening UCx 6/13 neg, 6/16 < 10 k, 6/20 neg. UA 6/13 TNTC wbc/rbc, 6/16 TNTC WBC, 6/20 WBC TNTC, 6/23 TNTC wbc, 7/5 TNTC  Recommendations With repeatedly negative urine cultures and failure to clear UA despite abx (even the 6/23 UA with TNTC WBC despite being on aztreaonam) she has STERILE PYURIA and needs  cystoscopy. She has bladder wall thickening vs dependent mass on CT imaging I think this may be a bladder malignancy or interstitial cystitis.   She is being readmitted more due to FTT as she does not seem to be able to maintain hydration and adequate po intake at home  IF UCX NEG TOMORROW WOULD DC ABX - > MORE ABX WILL NOT FIX THIS. WOULD RECONSULT UROLOGY AND DO CYSTO WHILE INPATIENT OR ELSE SHE WILL BE READMITTED AGAIN AND AGAIN. DO NOT NEED TO DELAY CYSTO UNTIL CLEARS INFECTION AS SHE DOES NOT HAVE INFECTION   Thank you very much for allowing me to participate in the care of this patient. Please call with questions.   Cheral Marker. Ola Spurr, MD

## 2015-12-14 NOTE — Progress Notes (Signed)
Patient with soiled pink foam allenvyn. Changed and skin tear assessed, which measures 3.5cm x 1.5cm. Patient has been alert this date. PT evaluated. Tentative discharge pending at this time.

## 2015-12-14 NOTE — Progress Notes (Signed)
Clinical Child psychotherapist (CSW) presented bed offers to patient's daughter [name]. Daughter chose [facility]. [facility] liaison is aware of accepted bed offer. CSW will continue to follow and assist as needed.   Jetta Lout, LCSW [phone]

## 2015-12-14 NOTE — Clinical Social Work Note (Signed)
Clinical Social Work Assessment  Patient Details  Name: Kelsey Chung MRN: [id number] Date of Birth: 06-May-1924  Date of referral:  12/14/15               Reason for consult:  Facility Placement                Permission sought to share information with:  Chartered certified accountant granted to share information::  Yes, Verbal Permission Granted  Name::      [place]::   [place]   Relationship::     Contact Information:     Housing/Transportation Living arrangements for the past 2 months:  [place] of Information:  Adult Children Patient Interpreter Needed:  None Criminal Activity/Legal Involvement Pertinent to Current Situation/Hospitalization:  No - Comment as needed Significant Relationships:  Adult Children Lives with:  Adult Children Do you feel safe going back to the place where you live?  Yes Need for family participation in patient care:  Yes (Comment)  Care giving concerns:  Patient lives with her daughter Kelsey Chung [phone] and son in law in [place].    Social Worker assessment / plan:  Holiday representative (CSW) reviewed chart and noted that patient has had falls at home and multiple hospital admissions. CSW met with patient and her daughter Kelsey Chung was at bedside. Patient was using the bed pan and CSW spoke with patient's daughter outside of the room. CSW introduced self and explained role of CSW department. Per daughter patient has been declining for the past 4 weeks and has had 3 hospital admissions in the past month. Per daughter patient has a bad UTI that will not clear up. Daughter reported that she is patient's primary care giver and patient has someone with her 24/7. Per daughter patient's son lives near by and helps when he can however he works full time and his support is limited. Per daughter patient uses 2 quad canes to walk with at baseline. Per daughter patient has been living with her since  her husband past away in Sept. 2015. CSW discussed SNF options. Daughter reported that the [facility] social worker was working on SNF placement from home. Daughter is agreeable to SNF search and prefers [facility] or [facility].   FL2 complete and faxed out. Patient has a a recent admission to inpatient at [facility] from 11/28/15 to 12/02/15 and meets the 3 night inpatient stay criteria to go to SNF under Medicare. CSW will continue to follow and assist as needed.   Employment status:  Disabled (Comment on whether or not currently receiving Disability), Retired Forensic scientist:  Medicare PT Recommendations:  Not assessed at this time Information / Referral to community resources:  [place]  Patient/Family's Response to care:  Patient's daughter Kelsey Chung is agreeable to SNF search in [place].   Patient/Family's Understanding of and Emotional Response to Diagnosis, Current Treatment, and Prognosis:  Patient's daughter Kelsey Chung was pleasant and thanked CSW for visit.   Emotional Assessment Appearance:  Appears stated age Attitude/Demeanor/Rapport:  Unable to Assess Affect (typically observed):  Unable to Assess Orientation:  Oriented to Self, Oriented to Place, Fluctuating Orientation (Suspected and/or reported Sundowners) Alcohol / Substance use:  Not Applicable Psych involvement (Current and /or in the community):  No (Comment)  Discharge Needs  Concerns to be addressed:  Discharge Planning Concerns Readmission within the last 30 days:  YES Current discharge risk:  Dependent with Mobility Barriers to Discharge:  Continued Medical Work up   Loralyn Freshwater, LCSW 12/14/2015, 1:40 PM

## 2015-12-14 NOTE — Progress Notes (Signed)
ANTICOAGULATION CONSULT NOTE - follow up Consult  Pharmacy Consult for apixaban Indication: atrial fibrillation  Allergies  Allergen Reactions  . Augmentin [Amoxicillin-Pot Clavulanate] Anaphylaxis and Other (See Comments)    Has patient had a PCN reaction causing immediate rash, facial/tongue/throat swelling, SOB or lightheadedness with hypotension: Yes Has patient had a PCN reaction causing severe rash involving mucus membranes or skin necrosis: No Has patient had a PCN reaction that required hospitalization No Has patient had a PCN reaction occurring within the last 10 years: Yes If all of the above answers are "NO", then may proceed with Cephalosporin use.  . Beta Adrenergic Blockers Other (See Comments)    Reaction:  Unknown  . Clindamycin Other (See Comments)    Reaction:  Redness to face/swollen eyes   . Excedrin Extra Strength [Aspirin-Acetaminophen-Caffeine] Diarrhea  . Keflex [Cephalexin] Swelling and Other (See Comments)    Reaction:  Lip swelling   . Meloxicam Other (See Comments)    Reaction:  Decreased renal function  . Methotrexate Sodium Other (See Comments)    Reaction:  Decreased renal function  . Nsaids Other (See Comments)    Reaction:  Unknown   . Quinine Rash  . Quinolones Rash    Patient Measurements: Height: 4\' 11"  (149.9 cm) Weight: 182 lb 4.8 oz (82.691 kg) IBW/kg (Calculated) : 43.2  Vital Signs: Temp: 98.3 F (36.8 C) (07/06 0750) Temp Source: Oral (07/06 0750) BP: 130/98 mmHg (07/06 0751) Pulse Rate: 78 (07/06 0750)  Labs:  Recent Labs  12/13/15 1754 12/14/15 0345  HGB 8.1* 8.0*  HCT 25.0* 24.5*  PLT 291 312  CREATININE 2.80* 2.65*    Estimated Creatinine Clearance: 13.4 mL/min (by C-G formula based on Cr of 2.65).   Medical History: Past Medical History  Diagnosis Date  . Diabetes (HCC)   . Hypertension   . Rheumatoid arthritis(714.0)   . Hyperlipidemia   . Gout   . Osteoarthritis (arthritis due to wear and tear of joints)    . Scoliosis   . Constipation, chronic   . CKD (chronic kidney disease) stage 4, GFR 15-29 ml/min (HCC)   . New onset atrial fibrillation (HCC)     a. diagnosed in 11/2015 - admitted for sepsis  . TIA (transient ischemic attack)     a. multiple TIA's 20+ years ago according to patient's daughter   Assessment: Patient was taking apixaban 2.5 mg PO BID PTA for atrial fibrillation. 82 yo with Scr 2.65.  Goal of Therapy:  Monitor platelets by anticoagulation protocol: Yes   Plan:  Continue current apixaban dose for now. Closely monitor renal function.   Naven Giambalvo A, PharmD 12/14/2015,10:41 AM

## 2015-12-14 NOTE — Progress Notes (Addendum)
[facility] [place] at [facility]   PATIENT NAME: Kelsey Chung    MR#:  [id number]  DATE OF BIRTH:  [birth year]  SUBJECTIVE:  CHIEF COMPLAINT  More awake and pleasantly confused  Daughter at bedside.  REVIEW OF SYSTEMS:  CONSTITUTIONAL: Has fatigue. Afebrile EYES: No blurred or double vision.  EARS, NOSE, AND THROAT: No tinnitus or ear pain.  RESPIRATORY: No cough, shortness of breath, wheezing or hemoptysis.  CARDIOVASCULAR: No chest pain, orthopnea, edema.  GASTROINTESTINAL: No nausea, vomiting, diarrhea or abdominal pain.  GENITOURINARY: No dysuria, hematuria.  ENDOCRINE: No polyuria, nocturia,  HEMATOLOGY: No anemia, easy bruising or bleeding SKIN: No rash or lesion. MUSCULOSKELETAL: No joint pain or arthritis.   NEUROLOGIC: Generalized weakness PSYCHIATRY: No anxiety or depression.   DRUG ALLERGIES:   Allergies  Allergen Reactions  . Augmentin [Amoxicillin-Pot Clavulanate] Anaphylaxis and Other (See Comments)    Has patient had a PCN reaction causing immediate rash, facial/tongue/throat swelling, SOB or lightheadedness with hypotension: Yes Has patient had a PCN reaction causing severe rash involving mucus membranes or skin necrosis: No Has patient had a PCN reaction that required hospitalization No Has patient had a PCN reaction occurring within the last 10 years: Yes If all of the above answers are "NO", then may proceed with Cephalosporin use.  . Beta Adrenergic Blockers Other (See Comments)    Reaction:  Unknown  . Clindamycin Other (See Comments)    Reaction:  Redness to face/swollen eyes   . Excedrin Extra Strength [Aspirin-Acetaminophen-Caffeine] Diarrhea  . Keflex [Cephalexin] Swelling and Other (See Comments)    Reaction:  Lip swelling   . Meloxicam Other (See Comments)    Reaction:  Decreased renal function  . Methotrexate Sodium Other (See Comments)    Reaction:  Decreased renal function  . Nsaids Other (See Comments)     Reaction:  Unknown   . Quinine Rash  . Quinolones Rash    VITALS:  Blood pressure 129/43, pulse 75, temperature 98.2 F (36.8 C), temperature source Oral, resp. rate 18, height 4\' 11"  (1.499 m), weight 82.691 kg (182 lb 4.8 oz), SpO2 93 %.  PHYSICAL EXAMINATION:  GENERAL:  82 y.o.-year-old patient lying in the bed with no acute distress.  EYES: Pupils equal, round, reactive to light and accommodation. No scleral icterus. Extraocular muscles intact.  HEENT: Head atraumatic, normocephalic. Oropharynx and nasopharynx clear.  NECK:  Supple, no jugular venous distention. No thyroid enlargement, no tenderness.  LUNGS: Normal breath sounds bilaterally, no wheezing, rales,rhonchi or crepitation. No use of accessory muscles of respiration.  CARDIOVASCULAR: S1, S2 normal. No murmurs, rubs, or gallops.  ABDOMEN: Soft, nontender, nondistended. Bowel sounds present. No organomegaly or mass.  EXTREMITIES: No pedal edema, cyanosis, or clubbing.  NEUROLOGIC: Cranial nerves II through XII are intact. Muscle strength 5- /5 bilateral lower extremities. Bilateral upper extremities 5/5. Sensation intact. Gait not checked.  PSYCHIATRIC: The patient is alert and awake. Foley catheter in place  LABORATORY PANEL:   CBC  Recent Labs Lab 12/14/15 0345  WBC 10.1  HGB 8.0*  HCT 24.5*  PLT 312   ------------------------------------------------------------------------------------------------------------------  Chemistries   Recent Labs Lab 12/13/15 1754 12/14/15 0345  NA 139 141  K 5.3* 5.2*  CL 104 106  CO2 28 28  GLUCOSE 104* 71  BUN 52* 51*  CREATININE 2.80* 2.65*  CALCIUM 9.5 9.4  AST 22  --   ALT 15  --   ALKPHOS 85  --   BILITOT 0.2*  --    ------------------------------------------------------------------------------------------------------------------  Cardiac Enzymes No results for input(s): TROPONINI in the last 168  hours. ------------------------------------------------------------------------------------------------------------------  RADIOLOGY:  Ct Abdomen Pelvis Wo Contrast  12/13/2015  CLINICAL DATA:  Altered mental status, distended abdomen, hypoglycemia, diabetes mellitus, hypertension EXAM: CT ABDOMEN AND PELVIS WITHOUT CONTRAST TECHNIQUE: Multidetector CT imaging of the abdomen and pelvis was performed following the standard protocol without IV contrast. Sagittal and coronal MPR images reconstructed from axial data set. COMPARISON:  11/28/2015 FINDINGS: Lower chest:  Bibasilar pleural effusions and atelectasis. Hepatobiliary: Gallbladder surgically absent. No focal hepatic abnormalities. Pancreas: Atrophic, otherwise unremarkable Spleen: Normal appearance Adrenals/Urinary Tract: Adrenal glands normal appearance vascular calcifications a kidneys. BILATERAL cortical renal atrophy. No mass or hydronephrosis. Foley catheter within urinary bladder with intravesicular air as well. Questionable wall thickening versus dependent density at posterior LEFT bladder base Stomach/Bowel: Appendix surgically absent by history. Stomach decompressed, unable to accurately assess wall thickness. Large and small bowel loops unremarkable. No bowel dilatation or bowel wall thickening. Vascular/Lymphatic: Scattered atherosclerotic calcifications aorta, iliac arteries, abdominal branch vessels and aortic root. Mitral annular calcification. Reproductive: Uterine calcification likely a degenerated leiomyoma. Otherwise unremarkable uterus and adnexa Other: Nonspecific presacral density. No free intraperitoneal air or fluid. No hernia. Musculoskeletal: Osseous demineralization with degenerative disc and facet disease changes lumbar spine associated with scoliosis. IMPRESSION: No definite acute intra-abdominal or intrapelvic abnormalities. Bibasilar pleural effusions and atelectasis. Aortic atherosclerosis. Electronically Signed   By: Ulyses Southward M.D.   On: 12/13/2015 19:16   Ct Head Wo Contrast  12/13/2015  CLINICAL DATA:  Altered mental status and abdominal distention. EXAM: CT HEAD WITHOUT CONTRAST TECHNIQUE: Contiguous axial images were obtained from the base of the skull through the vertex without intravenous contrast. COMPARISON:  Head CT 11/30/2015. FINDINGS: There is some atrophy and chronic microvascular change. No acute abnormality including hemorrhage, infarct, mass lesion, mass effect, midline shift or abnormal extra axial fluid collection is identified. Imaged paranasal sinuses and mastoid air cells are clear. The calvarium is intact. IMPRESSION: No acute abnormality. Atrophy and chronic microvascular ischemic change. Electronically Signed   By: Drusilla Kanner M.D.   On: 12/13/2015 19:16   Dg Chest Portable 1 View  12/13/2015  CLINICAL DATA:  Altered mental status EXAM: PORTABLE CHEST 1 VIEW COMPARISON:  11/28/2015 FINDINGS: 1905 hours. Low volume film. Patchy opacities are noted in the lungs bilaterally, right greater than left. The cardio pericardial silhouette is enlarged. The visualized bony structures of the thorax are intact. Telemetry leads overlie the chest. IMPRESSION: Interval development of bilateral patchy airspace opacities. Given the rapid interval appearance, infectious/inflammatory process is suspected. Dedicated upright two view chest x-ray may prove helpful to further evaluate. Cardiomegaly. Electronically Signed   By: Kennith Center M.D.   On: 12/13/2015 19:10    EKG:   Orders placed or performed during the hospital encounter of 12/13/15  . EKG 12-Lead  . EKG 12-Lead  . EKG 12-Lead  . EKG 12-Lead  . ED EKG 12-Lead  . ED EKG 12-Lead    ASSESSMENT AND PLAN:    * Acute cystitis recurrent - Now cathter related Continue meropenem Prior cultures have been negative She is allergic to betalactams and quinolones. During previous admission patient was treated with aztreonam and discharged home with by  mouth Bactrim for 14 days. Also foley placed due to urinary retention. Neurology has seen the patient during last admission and advised outpatient follow-up Consult infectious disease.  * Generalized weakness due to UTI   * Atrial fibrillation  On rate controlling medications and  Eliquis.  *Chronic history  of esophageal spasms Outpatient follow-up with [facility] gastroenterology as recommended Continue current diet as tolerated  * Acute kidney injury over CKD stage III Continue IV fluids  * Hypoglycemia with diabetes mellitus Due to poor oral intake. Also on oral hypoglycemics with acute kidney injury decreases clearance. Oral hypoglycemics held. On D5-1/2NS  * Hyperlipidemia  Continue statin.  * Arthritis  Continue home medications.   All the records are reviewed and case discussed with Care Management/Social Workerr. Management plans discussed with the patient, daughter and they are in agreement.  CODE STATUS: FULL CODE  TOTAL TIME TAKING CARE OF THIS PATIENT: 35 minutes.   POSSIBLE D/C IN 2 DAYS, DEPENDING ON CLINICAL CONDITION.  Note: This dictation was prepared with Dragon dictation along with smaller phrase technology. Any transcriptional errors that result from this process are unintentional.   Milagros Loll R M.D on 12/14/2015 at 1:16 PM  Between 7am to 6pm - Pager - [phone]  After 6pm go to [email or web address] - password [email or web address] [facility]  [facility]  Office  [phone]  CC: Primary care physician; Barbette Reichmann, MD

## 2015-12-14 NOTE — Progress Notes (Signed)
[facility]  Patient Status: Active  [facility] is providing the following services: SN/PT/HHA  If patient discharges after hours, please call [phone].   Dimple Casey 12/14/2015, 9:47 AM

## 2015-12-15 LAB — CBC
HCT: 22.2 % — ABNORMAL LOW (ref 35.0–47.0)
Hemoglobin: 7.2 g/dL — ABNORMAL LOW (ref 12.0–16.0)
MCH: 34.5 pg — AB (ref 26.0–34.0)
MCHC: 32.4 g/dL (ref 32.0–36.0)
MCV: 106.5 fL — ABNORMAL HIGH (ref 80.0–100.0)
Platelets: 264 10*3/uL (ref 150–440)
RBC: 2.08 MIL/uL — ABNORMAL LOW (ref 3.80–5.20)
RDW: 22.9 % — AB (ref 11.5–14.5)
WBC: 9.8 10*3/uL (ref 3.6–11.0)

## 2015-12-15 LAB — URINE CULTURE
Culture: >=100000 — AB
Culture: >=100000 — AB

## 2015-12-15 LAB — GLUCOSE, CAPILLARY
GLUCOSE-CAPILLARY: 245 mg/dL — AB (ref 65–99)
GLUCOSE-CAPILLARY: 256 mg/dL — AB (ref 65–99)
GLUCOSE-CAPILLARY: 308 mg/dL — AB (ref 65–99)
Glucose-Capillary: 239 mg/dL — ABNORMAL HIGH (ref 65–99)
Glucose-Capillary: 226 mg/dL — ABNORMAL HIGH (ref 65–99)
Glucose-Capillary: 301 mg/dL — ABNORMAL HIGH (ref 65–99)
Glucose-Capillary: 150 mg/dL — ABNORMAL HIGH (ref 65–99)

## 2015-12-15 LAB — BASIC METABOLIC PANEL
Anion gap: 5 (ref 5–15)
BUN: 44 mg/dL — AB (ref 6–20)
CALCIUM: 8.8 mg/dL — AB (ref 8.9–10.3)
CO2: 26 mmol/L (ref 22–32)
CREATININE: 2.43 mg/dL — AB (ref 0.44–1.00)
Chloride: 105 mmol/L (ref 101–111)
GFR calc non Af Amer: 17 mL/min — ABNORMAL LOW (ref >60)
GFR, EST AFRICAN AMERICAN: 19 mL/min — AB (ref >60)
Glucose, Bld: 235 mg/dL — ABNORMAL HIGH (ref 65–99)
Potassium: 5.2 mmol/L — ABNORMAL HIGH (ref 3.5–5.1)
SODIUM: 136 mmol/L (ref 135–145)

## 2015-12-15 MED ORDER — SODIUM CHLORIDE 0.9 % IV SOLN
INTRAVENOUS | Status: DC
  Administered 2015-12-15: 12:00:00 via INTRAVENOUS

## 2015-12-15 MED ORDER — SODIUM CHLORIDE 0.9 % IV SOLN
INTRAVENOUS | Status: DC
  Administered 2015-12-15: 09:00:00 via INTRAVENOUS

## 2015-12-15 MED ORDER — APIXABAN 2.5 MG PO TABS
2.5000 mg | ORAL_TABLET | Freq: Two times a day (BID) | ORAL | Status: DC
  Administered 2015-12-15 – 2015-12-18 (×7): 2.5 mg via ORAL
  Filled 2015-12-15 (×7): qty 1

## 2015-12-15 MED ORDER — SODIUM POLYSTYRENE SULFONATE 15 GM/60ML PO SUSP
15.0000 g | Freq: Once | ORAL | Status: AC
  Administered 2015-12-15: 15 g via ORAL
  Filled 2015-12-15: qty 60

## 2015-12-15 MED ORDER — INSULIN ASPART 100 UNIT/ML ~~LOC~~ SOLN
0.0000 [IU] | Freq: Three times a day (TID) | SUBCUTANEOUS | Status: DC
  Administered 2015-12-15: 7 [IU] via SUBCUTANEOUS
  Administered 2015-12-16 (×2): 2 [IU] via SUBCUTANEOUS
  Administered 2015-12-16: 5 [IU] via SUBCUTANEOUS
  Administered 2015-12-17: 1 [IU] via SUBCUTANEOUS
  Filled 2015-12-15: qty 2
  Filled 2015-12-15: qty 1
  Filled 2015-12-15: qty 2
  Filled 2015-12-15: qty 7
  Filled 2015-12-15: qty 5

## 2015-12-15 NOTE — Clinical Social Work Note (Signed)
Per MD, pt will be ready for discharge over the weekend. CSW updated [facility], and facility will be able to accept pt over the weekend. CSW will continue to follow.   Dede Query, MSW, LCSW  Clinical Social Worker [phone]

## 2015-12-15 NOTE — Progress Notes (Signed)
Urology  I was called by Dr. Elpidio Anis to discuss this patient.  She currently has an indwelling catheter and was readmitted with sepsis/ AMS.  She will likely be discharged over the weekend.  She has already been seen and evaluated during her recent admission and the end of June 2017 by Dr. Hadley Pen and return to our office on 12/13/2015 for a voiding trial but was too weak and the Foley remained in place. She was subsequently admitted to the hospital. UCx negative.  She does have a CT which shows no stones or hydronephrosis but a thickened bladder.  At this point, there is no additional urologic intervention warranted at this time and have recommended outpatient follow-up. I contacted our office to try to move this to sooner following discharge.  She may ultimately need a cystoscopy, however, I would not recommend this at this time due to likelihood of catheter cystitis/infectious cystitis with current indwelling catheter.  Prefer to defer this as an outpatient.  Kelsey Scotland, MD

## 2015-12-15 NOTE — Progress Notes (Signed)
[facility] - [place] at [facility]   PATIENT NAME: Kelsey Chung    MR#:  [id number]  DATE OF BIRTH:  May 24, 1924  SUBJECTIVE:  CHIEF COMPLAINT  More awake and alert. Daughter at bedside.  Some pink urine earlier . Now clear. Foley in place.  REVIEW OF SYSTEMS:  CONSTITUTIONAL: Has fatigue. Afebrile EYES: No blurred or double vision.  EARS, NOSE, AND THROAT: No tinnitus or ear pain.  RESPIRATORY: No cough, shortness of breath, wheezing or hemoptysis.  CARDIOVASCULAR: No chest pain, orthopnea, edema.  GASTROINTESTINAL: No nausea, vomiting, diarrhea or abdominal pain.  GENITOURINARY: No dysuria, hematuria.  ENDOCRINE: No polyuria, nocturia,  HEMATOLOGY: No anemia, easy bruising or bleeding SKIN: No rash or lesion. MUSCULOSKELETAL: No joint pain or arthritis.   NEUROLOGIC: Generalized weakness PSYCHIATRY: No anxiety or depression.   DRUG ALLERGIES:   Allergies  Allergen Reactions  . Augmentin [Amoxicillin-Pot Clavulanate] Anaphylaxis and Other (See Comments)    Has patient had a PCN reaction causing immediate rash, facial/tongue/throat swelling, SOB or lightheadedness with hypotension: Yes Has patient had a PCN reaction causing severe rash involving mucus membranes or skin necrosis: No Has patient had a PCN reaction that required hospitalization No Has patient had a PCN reaction occurring within the last 10 years: Yes If all of the above answers are "NO", then may proceed with Cephalosporin use.  . Beta Adrenergic Blockers Other (See Comments)    Reaction:  Unknown  . Clindamycin Other (See Comments)    Reaction:  Redness to face/swollen eyes   . Excedrin Extra Strength [Aspirin-Acetaminophen-Caffeine] Diarrhea  . Keflex [Cephalexin] Swelling and Other (See Comments)    Reaction:  Lip swelling   . Meloxicam Other (See Comments)    Reaction:  Decreased renal function  . Methotrexate Sodium Other (See Comments)    Reaction:  Decreased renal  function  . Nsaids Other (See Comments)    Reaction:  Unknown   . Quinine Rash  . Quinolones Rash    VITALS:  Blood pressure 142/44, pulse 73, temperature 98 F (36.7 C), temperature source Oral, resp. rate 20, height 4\' 11"  (1.499 m), weight 82.691 kg (182 lb 4.8 oz), SpO2 83 %.  PHYSICAL EXAMINATION:  GENERAL:  82 y.o.-year-old patient lying in the bed with no acute distress.  EYES: Pupils equal, round, reactive to light and accommodation. No scleral icterus. Extraocular muscles intact.  HEENT: Head atraumatic, normocephalic. Oropharynx and nasopharynx clear.  NECK:  Supple, no jugular venous distention. No thyroid enlargement, no tenderness.  LUNGS: Normal breath sounds bilaterally, no wheezing, rales,rhonchi or crepitation. No use of accessory muscles of respiration.  CARDIOVASCULAR: S1, S2 normal. No murmurs, rubs, or gallops.  ABDOMEN: Soft, nontender, nondistended. Bowel sounds present. No organomegaly or mass.  EXTREMITIES: No pedal edema, cyanosis, or clubbing.  NEUROLOGIC: Cranial nerves II through XII are intact. Muscle strength 5- /5 bilateral lower extremities. Bilateral upper extremities 5/5. Sensation intact. Gait not checked.  PSYCHIATRIC: The patient is alert and awake. Foley catheter in place  LABORATORY PANEL:   CBC  Recent Labs Lab 12/15/15 0342  WBC 9.8  HGB 7.2*  HCT 22.2*  PLT 264   ------------------------------------------------------------------------------------------------------------------  Chemistries   Recent Labs Lab 12/13/15 1754  12/15/15 0342  NA 139  < > 136  K 5.3*  < > 5.2*  CL 104  < > 105  CO2 28  < > 26  GLUCOSE 104*  < > 235*  BUN 52*  < > 44*  CREATININE 2.80*  < >  2.43*  CALCIUM 9.5  < > 8.8*  AST 22  --   --   ALT 15  --   --   ALKPHOS 85  --   --   BILITOT 0.2*  --   --   < > = values in this interval not  displayed. ------------------------------------------------------------------------------------------------------------------  Cardiac Enzymes No results for input(s): TROPONINI in the last 168 hours. ------------------------------------------------------------------------------------------------------------------  RADIOLOGY:  Ct Abdomen Pelvis Wo Contrast  12/13/2015  CLINICAL DATA:  Altered mental status, distended abdomen, hypoglycemia, diabetes mellitus, hypertension EXAM: CT ABDOMEN AND PELVIS WITHOUT CONTRAST TECHNIQUE: Multidetector CT imaging of the abdomen and pelvis was performed following the standard protocol without IV contrast. Sagittal and coronal MPR images reconstructed from axial data set. COMPARISON:  11/28/2015 FINDINGS: Lower chest:  Bibasilar pleural effusions and atelectasis. Hepatobiliary: Gallbladder surgically absent. No focal hepatic abnormalities. Pancreas: Atrophic, otherwise unremarkable Spleen: Normal appearance Adrenals/Urinary Tract: Adrenal glands normal appearance vascular calcifications a kidneys. BILATERAL cortical renal atrophy. No mass or hydronephrosis. Foley catheter within urinary bladder with intravesicular air as well. Questionable wall thickening versus dependent density at posterior LEFT bladder base Stomach/Bowel: Appendix surgically absent by history. Stomach decompressed, unable to accurately assess wall thickness. Large and small bowel loops unremarkable. No bowel dilatation or bowel wall thickening. Vascular/Lymphatic: Scattered atherosclerotic calcifications aorta, iliac arteries, abdominal branch vessels and aortic root. Mitral annular calcification. Reproductive: Uterine calcification likely a degenerated leiomyoma. Otherwise unremarkable uterus and adnexa Other: Nonspecific presacral density. No free intraperitoneal air or fluid. No hernia. Musculoskeletal: Osseous demineralization with degenerative disc and facet disease changes lumbar spine associated  with scoliosis. IMPRESSION: No definite acute intra-abdominal or intrapelvic abnormalities. Bibasilar pleural effusions and atelectasis. Aortic atherosclerosis. Electronically Signed   By: Ulyses Southward M.D.   On: 12/13/2015 19:16   Ct Head Wo Contrast  12/13/2015  CLINICAL DATA:  Altered mental status and abdominal distention. EXAM: CT HEAD WITHOUT CONTRAST TECHNIQUE: Contiguous axial images were obtained from the base of the skull through the vertex without intravenous contrast. COMPARISON:  Head CT 11/30/2015. FINDINGS: There is some atrophy and chronic microvascular change. No acute abnormality including hemorrhage, infarct, mass lesion, mass effect, midline shift or abnormal extra axial fluid collection is identified. Imaged paranasal sinuses and mastoid air cells are clear. The calvarium is intact. IMPRESSION: No acute abnormality. Atrophy and chronic microvascular ischemic change. Electronically Signed   By: Drusilla Kanner M.D.   On: 12/13/2015 19:16   Dg Chest Portable 1 View  12/13/2015  CLINICAL DATA:  Altered mental status EXAM: PORTABLE CHEST 1 VIEW COMPARISON:  11/28/2015 FINDINGS: 1905 hours. Low volume film. Patchy opacities are noted in the lungs bilaterally, right greater than left. The cardio pericardial silhouette is enlarged. The visualized bony structures of the thorax are intact. Telemetry leads overlie the chest. IMPRESSION: Interval development of bilateral patchy airspace opacities. Given the rapid interval appearance, infectious/inflammatory process is suspected. Dedicated upright two view chest x-ray may prove helpful to further evaluate. Cardiomegaly. Electronically Signed   By: Kennith Center M.D.   On: 12/13/2015 19:10    EKG:   Orders placed or performed during the hospital encounter of 12/13/15  . EKG 12-Lead  . EKG 12-Lead  . EKG 12-Lead  . EKG 12-Lead  . ED EKG 12-Lead  . ED EKG 12-Lead    ASSESSMENT AND PLAN:    * Acute cystitis recurrent - Infectious? Continue  meropenem Prior cultures have been negative. Urine culture growing yeast which is likely colonized. She is allergic  to betalactams and quinolones. During previous admission patient was treated with aztreonam and discharged home with by mouth Bactrim for 14 days. Also foley placed due to urinary retention. Appreciate infectious disease input. Discussed with Dr. Apolinar Junes of urology. Advised outpatient follow-up for cystoscopy.  * Generalized weakness due to UTI   * Atrial fibrillation  On rate controlling medications and Eliquis.  *Chronic history  of esophageal spasms Outpatient follow-up with [facility] gastroenterology as recommended Continue current diet as tolerated  * Acute kidney injury over CKD stage III Continue IV fluids  * Hypoglycemia with diabetes mellitus Patient on D5 and blood sugars are in the 200s. We'll start D5 and add sliding scale insulin. If blood sugars continue to be elevated we'll add glipizide 2.5 daily.  * Hyperlipidemia  Continue statin.  * Arthritis  Continue home medications.  All the records are reviewed and case discussed with Care Management/Social Workerr. Management plans discussed with the patient, daughter and they are in agreement.  CODE STATUS: FULL CODE  TOTAL TIME TAKING CARE OF THIS PATIENT: 35 minutes.   POSSIBLE D/C IN 1-2 DAYS, DEPENDING ON CLINICAL CONDITION.  Note: This dictation was prepared with Dragon dictation along with smaller phrase technology. Any transcriptional errors that result from this process are unintentional.   Milagros Loll R M.D on 12/15/2015 at 11:57 AM  Between 7am to 6pm - Pager - [phone]  After 6pm go to [email or web address] - password [email or web address] [facility]  [place] [facility]  Office  [phone]  CC: Primary care physician; Barbette Reichmann, MD

## 2015-12-15 NOTE — Care Management Important Message (Signed)
Important Message  Patient Details  Name: Kelsey Chung MRN: [id number] Date of Birth: April 01, 1924   Medicare Important Message Given:  Yes    [name] A Carlicia Leavens 12/15/2015, 1:46 PM

## 2015-12-15 NOTE — Clinical Documentation Improvement (Addendum)
Internal Medicine  Can the diagnosis of systemic infection be further specified?   Sepsis - specify causative organism if known  Determine if there is Severe Sepsis (Sepsis with organ dysfunction - specify), Septic Shock if present  Identify etiology of Sepsis - Device, Implant, Graft, Infusion, Abortion}  Other  Clinically Undetermined  Document any associated diagnoses/conditions. Supporting Information: ED: "Patient is septic, Sepsis, due to unspecified organism [facility]) H&P: currently on Bactrim to be finished tomorrow for recent urinary tract infection. Foley cath changed   Sepsis ruled out.  Please exercise your independent, professional judgment when responding. A specific answer is not anticipated or expected. Please update your documentation within the medical record to reflect your response to this query. Thank you  Thank Barrie Dunker Health Information Management [place] [phone]

## 2015-12-15 NOTE — Progress Notes (Signed)
Physical Therapy Treatment Patient Details Name: Kelsey Chung MRN: [id number] DOB: 1923/12/03 Today's Date: 12/15/2015    History of Present Illness Pt is an 82 yr old female presenting with altered mental status and decreased arousability. Admitted with hypoglycemia, AKI, and recurrent UTI. PMH significant for DM, HTN, RA, gout, OA, AFib, CKD, and h/o TIAs.     PT Comments    Pt requiring mod A x2 for supine > sit, but experienced an episode of dizziness sitting EOB, thus limiting further progression towards goals. Pt was returned supine with max A x2 and vitals were noted to be the following: BP 156/74 mmHg, O2 94% on room air, and HR rapidly fluctuating in the 100's (as low as 102 and as high as 125 bpm). RN notified. Prior to this episode, pt participated in supine LE strengthening exercises as detailed below without difficulty. Will continue to progress OOB activity as tolerated by the pt.    Follow Up Recommendations  SNF     Equipment Recommendations   (pt has equipment at home)    Recommendations for Other Services       Precautions / Restrictions Precautions Precautions: Fall Restrictions Weight Bearing Restrictions: No    Mobility  Bed Mobility Overal bed mobility: Needs Assistance Bed Mobility: Supine to Sit;Sit to Supine Supine to sit: Mod assist;+2 for physical assistance Sit to supine: Max assist;+2 for physical assistance   General bed mobility comments: Pt able to slide bilat LE's off EOB, but requires mod A x2 at trunk and hips to achieve sitting EOB. Immediately after achieving EOB, pt reports feeling as if the room was spinning. Pt unable to focus her eyes and displaying labored breathing. Pt returned to supine with max A x2 and vitals were assessed. BP 156/74, HR fluctuating 102-125, O2 94%. Dizziness resolved in supine.  Transfers General transfer comment: Did not assess  Ambulation/Gait General Gait Details: Did not assess   Stairs     Wheelchair Mobility    Modified Rankin (Stroke Patients Only)       Balance Overall balance assessment: Needs assistance Sitting-balance support: Bilateral upper extremity supported Sitting balance-Leahy Scale: Poor Sitting balance - Comments: Pt requiring mod A to maintain upright sitting EOB due to posterior push. Postural control: Posterior lean    Cognition Arousal/Alertness: Awake/alert Behavior During Therapy: WFL for tasks assessed/performed Overall Cognitive Status: Within Functional Limits for tasks assessed    Exercises General Exercises - Lower Extremity Ankle Circles/Pumps: AROM;PROM (limited AROM; PROM for increased range) Quad Sets: AROM Short Arc Quad: AROM Heel Slides: AROM Hip ABduction/ADduction: AROM Straight Leg Raises: AROM All exercises performed bilaterally x 10 reps in supine.  Other Exercises: tight gastrocs/heel cords limit PROM ankle dorsiflexion to neutral only. PT performed dorsiflexion stretch bilaterally held x60 seconds each.    General Comments General comments (skin integrity, edema, etc.): Dressing at R elbow intact, noted to have bloody drainage. Ecchymosis LUE.   Nursing cleared pt for participation in physical therapy.  Pt resting supine with daughter present bedside, agreeable to PT session.       Pertinent Vitals/Pain Pain Assessment: No/denies pain  SaO2 on room air: 95% upon room entry, 94% following supine TherEx, 94% in supine after sitting EOB           PT Goals (current goals can now be found in the care plan section) Acute Rehab PT Goals Patient Stated Goal: to get stronger PT Goal Formulation: With patient/family Time For Goal Achievement: 12/28/15 Potential to Achieve Goals:  Fair Progress towards PT goals: PT to reassess next treatment    Frequency  Min 2X/week    PT Plan Current plan remains appropriate    End of Session Activity Tolerance: Patient limited by fatigue, dizziness Patient left: in  bed;with call bell/phone within reach;with bed alarm set;with family/visitor present    Time: 1430-1459 PT Time Calculation (min) (ACUTE ONLY): 29 min  Charges:                       G Codes:      Teancum Brule, SPT 12/15/2015, 3:13 PM

## 2015-12-16 ENCOUNTER — Inpatient Hospital Stay: Payer: Medicare Other

## 2015-12-16 LAB — BASIC METABOLIC PANEL
ANION GAP: 5 (ref 5–15)
BUN: 34 mg/dL — ABNORMAL HIGH (ref 6–20)
CHLORIDE: 107 mmol/L (ref 101–111)
CO2: 27 mmol/L (ref 22–32)
Calcium: 9 mg/dL (ref 8.9–10.3)
Creatinine, Ser: 1.91 mg/dL — ABNORMAL HIGH (ref 0.44–1.00)
GFR calc non Af Amer: 22 mL/min — ABNORMAL LOW (ref >60)
GFR, EST AFRICAN AMERICAN: 26 mL/min — AB (ref >60)
GLUCOSE: 194 mg/dL — AB (ref 65–99)
Potassium: 4.7 mmol/L (ref 3.5–5.1)
Sodium: 139 mmol/L (ref 135–145)

## 2015-12-16 LAB — GLUCOSE, CAPILLARY
GLUCOSE-CAPILLARY: 194 mg/dL — AB (ref 65–99)
GLUCOSE-CAPILLARY: 165 mg/dL — AB (ref 65–99)
Glucose-Capillary: 181 mg/dL — ABNORMAL HIGH (ref 65–99)
Glucose-Capillary: 262 mg/dL — ABNORMAL HIGH (ref 65–99)

## 2015-12-16 LAB — CBC
HEMATOCRIT: 26.9 % — AB (ref 35.0–47.0)
HEMOGLOBIN: 8.6 g/dL — AB (ref 12.0–16.0)
MCH: 34.6 pg — ABNORMAL HIGH (ref 26.0–34.0)
MCHC: 31.8 g/dL — ABNORMAL LOW (ref 32.0–36.0)
MCV: 108.8 fL — ABNORMAL HIGH (ref 80.0–100.0)
Platelets: 338 10*3/uL (ref 150–440)
RBC: 2.47 MIL/uL — ABNORMAL LOW (ref 3.80–5.20)
RDW: 23.3 % — ABNORMAL HIGH (ref 11.5–14.5)
WBC: 13.4 10*3/uL — AB (ref 3.6–11.0)

## 2015-12-16 MED ORDER — FUROSEMIDE 10 MG/ML IJ SOLN
40.0000 mg | Freq: Once | INTRAMUSCULAR | Status: AC
  Administered 2015-12-16: 40 mg via INTRAVENOUS
  Filled 2015-12-16: qty 4

## 2015-12-16 MED ORDER — GLIPIZIDE ER 2.5 MG PO TB24
2.5000 mg | ORAL_TABLET | Freq: Every day | ORAL | Status: DC
  Administered 2015-12-16 – 2015-12-18 (×3): 2.5 mg via ORAL
  Filled 2015-12-16 (×3): qty 1

## 2015-12-16 MED ORDER — TEMAZEPAM 7.5 MG PO CAPS
7.5000 mg | ORAL_CAPSULE | Freq: Every evening | ORAL | Status: DC | PRN
Start: 2015-12-16 — End: 2015-12-18
  Administered 2015-12-16 – 2015-12-17 (×2): 7.5 mg via ORAL
  Filled 2015-12-16 (×2): qty 1

## 2015-12-16 MED ORDER — METOPROLOL TARTRATE 25 MG PO TABS
25.0000 mg | ORAL_TABLET | Freq: Two times a day (BID) | ORAL | Status: DC
  Administered 2015-12-16 (×2): 25 mg via ORAL
  Filled 2015-12-16 (×3): qty 1

## 2015-12-16 MED ORDER — IPRATROPIUM-ALBUTEROL 0.5-2.5 (3) MG/3ML IN SOLN: 3.0000 mL | RESPIRATORY_TRACT | Status: DC | PRN

## 2015-12-16 MED ORDER — QUETIAPINE FUMARATE ER 50 MG PO TB24
50.0000 mg | ORAL_TABLET | Freq: Every day | ORAL | Status: DC
  Administered 2015-12-16 – 2015-12-17 (×2): 50 mg via ORAL
  Filled 2015-12-16 (×2): qty 1

## 2015-12-16 MED ORDER — FUROSEMIDE 10 MG/ML IJ SOLN
40.0000 mg | Freq: Two times a day (BID) | INTRAMUSCULAR | Status: AC
  Administered 2015-12-16: 40 mg via INTRAVENOUS
  Filled 2015-12-16: qty 4

## 2015-12-16 MED ORDER — LACTULOSE 10 GM/15ML PO SOLN: 20.0000 g | Freq: Every day | ORAL | Status: DC | PRN

## 2015-12-16 NOTE — Progress Notes (Signed)
[facility] Physicians - [place] at [facility]   PATIENT NAME: Kelsey Chung    MR#:  [id number]  DATE OF BIRTH:  [birth year]  SUBJECTIVE:  CHIEF COMPLAINT:  Shortness of breath with cough. Afebrile.  REVIEW OF SYSTEMS:  CONSTITUTIONAL: Has fatigue. Afebrile EYES: No blurred or double vision.  EARS, NOSE, AND THROAT: No tinnitus or ear pain.  RESPIRATORY: No cough, shortness of breath, wheezing or hemoptysis.  CARDIOVASCULAR: No chest pain, orthopnea, edema.  GASTROINTESTINAL: No nausea, vomiting, diarrhea or abdominal pain.  GENITOURINARY: No dysuria, hematuria.  ENDOCRINE: No polyuria, nocturia,  HEMATOLOGY: No anemia, easy bruising or bleeding SKIN: No rash or lesion. MUSCULOSKELETAL: No joint pain or arthritis.   NEUROLOGIC: Generalized weakness PSYCHIATRY: No anxiety or depression.   DRUG ALLERGIES:   Allergies  Allergen Reactions  . Augmentin [Amoxicillin-Pot Clavulanate] Anaphylaxis and Other (See Comments)    Has patient had a PCN reaction causing immediate rash, facial/tongue/throat swelling, SOB or lightheadedness with hypotension: Yes Has patient had a PCN reaction causing severe rash involving mucus membranes or skin necrosis: No Has patient had a PCN reaction that required hospitalization No Has patient had a PCN reaction occurring within the last 10 years: Yes If all of the above answers are "NO", then may proceed with Cephalosporin use.  . Beta Adrenergic Blockers Other (See Comments)    Reaction:  Unknown  . Clindamycin Other (See Comments)    Reaction:  Redness to face/swollen eyes   . Excedrin Extra Strength [Aspirin-Acetaminophen-Caffeine] Diarrhea  . Keflex [Cephalexin] Swelling and Other (See Comments)    Reaction:  Lip swelling   . Meloxicam Other (See Comments)    Reaction:  Decreased renal function  . Methotrexate Sodium Other (See Comments)    Reaction:  Decreased renal function  . Nsaids Other (See Comments)    Reaction:   Unknown   . Quinine Rash  . Quinolones Rash    VITALS:  Blood pressure 155/71, pulse 112, temperature 97.6 F (36.4 C), temperature source Oral, resp. rate 20, height 4\' 11"  (1.499 m), weight 82.691 kg (182 lb 4.8 oz), SpO2 90 %.  PHYSICAL EXAMINATION:  GENERAL:  82 y.o.-year-old patient lying in the bed with no acute distress.  EYES: Pupils equal, round, reactive to light and accommodation. No scleral icterus. Extraocular muscles intact.  HEENT: Head atraumatic, normocephalic. Oropharynx and nasopharynx clear.  NECK:  Supple, no jugular venous distention. No thyroid enlargement, no tenderness.  LUNGS: Increased work of breathing with bilateral crackles and expiratory wheezing. CARDIOVASCULAR: S1, S2 normal. No murmurs, rubs, or gallops.  ABDOMEN: Soft, nontender, nondistended. Bowel sounds present. No organomegaly or mass.  EXTREMITIES: No pedal edema, cyanosis, or clubbing.  NEUROLOGIC: Cranial nerves II through XII are intact. Muscle strength 5- /5 bilateral lower extremities. Bilateral upper extremities 5/5. Sensation intact. Gait not checked.  PSYCHIATRIC: The patient is alert and awake. Foley catheter in place  LABORATORY PANEL:   CBC  Recent Labs Lab 12/16/15 0442  WBC 13.4*  HGB 8.6*  HCT 26.9*  PLT 338   ------------------------------------------------------------------------------------------------------------------  Chemistries   Recent Labs Lab 12/13/15 1754  12/16/15 0442  NA 139  < > 139  K 5.3*  < > 4.7  CL 104  < > 107  CO2 28  < > 27  GLUCOSE 104*  < > 194*  BUN 52*  < > 34*  CREATININE 2.80*  < > 1.91*  CALCIUM 9.5  < > 9.0  AST 22  --   --  ALT 15  --   --   ALKPHOS 85  --   --   BILITOT 0.2*  --   --   < > = values in this interval not displayed. ------------------------------------------------------------------------------------------------------------------  Cardiac Enzymes No results for input(s): TROPONINI in the last 168  hours. ------------------------------------------------------------------------------------------------------------------  RADIOLOGY:  Dg Chest 2 View  12/16/2015  CLINICAL DATA:  Shortness of Breath EXAM: CHEST  2 VIEW COMPARISON:  December 13, 2015 FINDINGS: There is increased airspace consolidation in the right lower lobe. There is patchy airspace consolidation in the left lower lobe and left upper lobe regions. There has been clearing of patchy opacity from the right upper lobe since recent prior study. There is a layering pleural effusion on the left. Heart is mildly enlarged. The pulmonary vascularity is within normal limits. There is extensive atherosclerotic calcification throughout the aorta. No bone lesions are evident. IMPRESSION: Multifocal areas of infiltrate, likely multifocal pneumonia. There is a left pleural effusion with mild cardiomegaly. There may be a degree of underlying congestive heart failure. There is aortic atherosclerosis. Electronically Signed   By: Bretta Bang III M.D.   On: 12/16/2015 10:29    EKG:   Orders placed or performed during the hospital encounter of 12/13/15  . EKG 12-Lead  . EKG 12-Lead  . EKG 12-Lead  . EKG 12-Lead  . ED EKG 12-Lead  . ED EKG 12-Lead    ASSESSMENT AND PLAN:    * Acute hypoxic respiratory failure due to acute on chronic diastolic CHF Will do a stat dose of Lasix. Oxygen 2 L. Nebulizers when necessary. Added 2 more doses of Lasix starting today evening. Chest x-ray shows bilateral infiltrates which are likely pulmonary edema and not pneumonia. No antibiotics at this time. Afebrile. Will repeat chest x-ray in the morning. Recent echocardiogram reviewed.  * Acute cystitis recurrent - Infectious? Stopped meropenem as per ID. Prior cultures have been negative. Urine culture growing yeast which is likely colonized. During previous admission patient was treated with aztreonam and discharged home with by mouth Bactrim for 14 days.  Also foley placed due to urinary retention. Appreciate infectious disease input. Discussed with Dr. Apolinar Junes of urology. Advised outpatient follow-up for cystoscopy. Will continue foley. Was changed in the emergency room  * Generalized weakness due to dehydration and acute renal failure Improving   * Atrial fibrillation  On rate controlling medications and Eliquis.  *Chronic history  of esophageal spasms Outpatient follow-up with [facility] gastroenterology as recommended Continue current diet as tolerated  * Acute kidney injury over CKD stage III Approved with IV fluids. Stop today.  * Hypoglycemia with diabetes mellitus This is resolved. Patient was on D5 which was stopped. We will restart glipizide at 2.5 mg daily.  * Hyperlipidemia  Continue statin.  * Arthritis  Continue home medications.  All the records are reviewed and case discussed with Care Management/Social Workerr. Management plans discussed with the patient, daughter and they are in agreement.  CODE STATUS: FULL CODE  TOTAL CC TIME TAKING CARE OF THIS PATIENT: 35 minutes.   POSSIBLE D/C IN 1-2 DAYS, DEPENDING ON CLINICAL CONDITION.  Note: This dictation was prepared with Dragon dictation along with smaller phrase technology. Any transcriptional errors that result from this process are unintentional.   Milagros Loll R M.D on 12/16/2015 at 10:45 AM  Between 7am to 6pm - Pager - [phone]  After 6pm go to [email or web address] - password [email or web address] [facility]  [place] [facility]  Office  [phone]  CC: Primary  care physician; Barbette Reichmann, MD

## 2015-12-16 NOTE — Progress Notes (Signed)
Patient is alert to self only. Blood sugars 181, 262, 194, gave SS insulin coverage. Drinking and eating with assistance. Foley draining yellow urine. Patient gets agitated at times, and desats to the mid 80's, was placed on 2L O2, sats go to high 90's. At rest, without O2, sats are in the mid 90's. MD aware. Skin tear to right elbow. Dressing intact. Family at bedside all day. NSL in RAC and LH, flushed without difficulty. Meds crushed in applesause, family suggested ice cream. Patient hasn't slept in days, will pass on in report to nights (patient has a sleeping aid PRN).

## 2015-12-16 NOTE — Progress Notes (Signed)
MD is aware of low oxygen.  Oxygen ordered.  MD also ordered chest x-ray and other orders

## 2015-12-16 NOTE — Clinical Social Work Note (Signed)
Physician stated patient not to discharge today to [facility] due to exacerbation of CHF. CSW notified Jomarie Longs at [facility] of delay in discharge. York Spaniel MSW,LCSW [phone]

## 2015-12-17 ENCOUNTER — Inpatient Hospital Stay: Payer: Medicare Other

## 2015-12-17 LAB — CBC WITH DIFFERENTIAL/PLATELET
BASOS ABS: 0.1 10*3/uL (ref 0–0.1)
BASOS PCT: 1 %
EOS ABS: 0.5 10*3/uL (ref 0–0.7)
Eosinophils Relative: 5 %
HEMATOCRIT: 25.9 % — AB (ref 35.0–47.0)
HEMOGLOBIN: 8.1 g/dL — AB (ref 12.0–16.0)
Lymphocytes Relative: 11 %
Lymphs Abs: 1 10*3/uL (ref 1.0–3.6)
MCH: 35 pg — ABNORMAL HIGH (ref 26.0–34.0)
MCHC: 31.5 g/dL — ABNORMAL LOW (ref 32.0–36.0)
MCV: 111.2 fL — ABNORMAL HIGH (ref 80.0–100.0)
MONO ABS: 0.9 10*3/uL (ref 0.2–0.9)
Monocytes Relative: 10 %
NEUTROS ABS: 6.8 10*3/uL — AB (ref 1.4–6.5)
NEUTROS PCT: 73 %
Platelets: 218 10*3/uL (ref 150–440)
RBC: 2.33 MIL/uL — ABNORMAL LOW (ref 3.80–5.20)
RDW: 22.9 % — AB (ref 11.5–14.5)
WBC: 9.2 10*3/uL (ref 3.6–11.0)

## 2015-12-17 LAB — BASIC METABOLIC PANEL
ANION GAP: 2 — AB (ref 5–15)
BUN: 32 mg/dL — ABNORMAL HIGH (ref 6–20)
CALCIUM: 8.9 mg/dL (ref 8.9–10.3)
CO2: 29 mmol/L (ref 22–32)
CREATININE: 1.73 mg/dL — AB (ref 0.44–1.00)
Chloride: 108 mmol/L (ref 101–111)
GFR, EST AFRICAN AMERICAN: 29 mL/min — AB (ref >60)
GFR, EST NON AFRICAN AMERICAN: 25 mL/min — AB (ref >60)
Glucose, Bld: 147 mg/dL — ABNORMAL HIGH (ref 65–99)
Potassium: 4.8 mmol/L (ref 3.5–5.1)
Sodium: 139 mmol/L (ref 135–145)

## 2015-12-17 LAB — GLUCOSE, CAPILLARY
GLUCOSE-CAPILLARY: 142 mg/dL — AB (ref 65–99)
GLUCOSE-CAPILLARY: 100 mg/dL — AB (ref 65–99)
GLUCOSE-CAPILLARY: 89 mg/dL (ref 65–99)
Glucose-Capillary: 165 mg/dL — ABNORMAL HIGH (ref 65–99)

## 2015-12-17 MED ORDER — DILTIAZEM HCL ER COATED BEADS 180 MG PO CP24
180.0000 mg | ORAL_CAPSULE | Freq: Every day | ORAL | Status: DC
  Filled 2015-12-17: qty 1

## 2015-12-17 MED ORDER — FUROSEMIDE 10 MG/ML IJ SOLN: 40.0000 mg | Freq: Two times a day (BID) | INTRAMUSCULAR | Status: DC

## 2015-12-17 MED ORDER — METOPROLOL TARTRATE 25 MG PO TABS
12.5000 mg | ORAL_TABLET | Freq: Two times a day (BID) | ORAL | Status: DC
  Administered 2015-12-17: 12.5 mg via ORAL
  Filled 2015-12-17 (×3): qty 1

## 2015-12-17 MED ORDER — GLUCERNA SHAKE PO LIQD
237.0000 mL | Freq: Three times a day (TID) | ORAL | Status: DC
  Administered 2015-12-17 – 2015-12-19 (×4): 237 mL via ORAL

## 2015-12-17 NOTE — Progress Notes (Signed)
Patient is alert and answers questions appropriately this pm.  Took meds crushed in ice cream, in small bits. On 2L, O2, desats at times, otherwise 97-100%. Tele in place running A-Fib. HOH, hearing aids in place. Foley in place, draining amber colored urine. Repositioned every two hours through out the day. Blood sugars 142, 100, and 89. Patient was able to eat dinner only d/t cognition earlier in the day. Skin tear to right elbow, dressing intact.

## 2015-12-17 NOTE — Progress Notes (Signed)
[facility] - [place] at [facility]   PATIENT NAME: Kelsey Chung    MR#:  [id number]  DATE OF BIRTH:  [birth year]  SUBJECTIVE:  CHIEF COMPLAINT:  Patient sleeping today after many days per family. Nothing acute overnight  REVIEW OF SYSTEMS:  Patient sleeping  DRUG ALLERGIES:   Allergies  Allergen Reactions  . Augmentin [Amoxicillin-Pot Clavulanate] Anaphylaxis and Other (See Comments)    Has patient had a PCN reaction causing immediate rash, facial/tongue/throat swelling, SOB or lightheadedness with hypotension: Yes Has patient had a PCN reaction causing severe rash involving mucus membranes or skin necrosis: No Has patient had a PCN reaction that required hospitalization No Has patient had a PCN reaction occurring within the last 10 years: Yes If all of the above answers are "NO", then may proceed with Cephalosporin use.  . Beta Adrenergic Blockers Other (See Comments)    Reaction:  Unknown  . Clindamycin Other (See Comments)    Reaction:  Redness to face/swollen eyes   . Excedrin Extra Strength [Aspirin-Acetaminophen-Caffeine] Diarrhea  . Keflex [Cephalexin] Swelling and Other (See Comments)    Reaction:  Lip swelling   . Meloxicam Other (See Comments)    Reaction:  Decreased renal function  . Methotrexate Sodium Other (See Comments)    Reaction:  Decreased renal function  . Nsaids Other (See Comments)    Reaction:  Unknown   . Quinine Rash  . Quinolones Rash    VITALS:  Blood pressure 101/39, pulse 68, temperature 97.7 F (36.5 C), temperature source Oral, resp. rate 18, height 4\' 11"  (1.499 m), weight 82.691 kg (182 lb 4.8 oz), SpO2 100 %.  PHYSICAL EXAMINATION:  GENERAL:  82 y.o.-year-old patient lying in the bed with no acute distress.  HEENT: Head atraumatic, normocephalic. Oropharynx and nasopharynx clear.  NECK:  Supple, no jugular venous distention. No thyroid enlargement, no tenderness.  LUNGS: Increased work of breathing with  bilateral crackles and expiratory wheezing. CARDIOVASCULAR: S1, S2 normal. No murmurs, rubs, or gallops.  ABDOMEN: Soft, nontender, nondistended. Bowel sounds present. No organomegaly or mass.  EXTREMITIES: No pedal edema, cyanosis, or clubbing.  NEUROLOGIC: Asleep  Foley catheter in place  LABORATORY PANEL:   CBC  Recent Labs Lab 12/17/15 0510  WBC 9.2  HGB 8.1*  HCT 25.9*  PLT 218   ------------------------------------------------------------------------------------------------------------------  Chemistries   Recent Labs Lab 12/13/15 1754  12/17/15 0510  NA 139  < > 139  K 5.3*  < > 4.8  CL 104  < > 108  CO2 28  < > 29  GLUCOSE 104*  < > 147*  BUN 52*  < > 32*  CREATININE 2.80*  < > 1.73*  CALCIUM 9.5  < > 8.9  AST 22  --   --   ALT 15  --   --   ALKPHOS 85  --   --   BILITOT 0.2*  --   --   < > = values in this interval not displayed. ------------------------------------------------------------------------------------------------------------------  Cardiac Enzymes No results for input(s): TROPONINI in the last 168 hours. ------------------------------------------------------------------------------------------------------------------  RADIOLOGY:  Dg Chest 2 View  12/16/2015  CLINICAL DATA:  Shortness of Breath EXAM: CHEST  2 VIEW COMPARISON:  December 13, 2015 FINDINGS: There is increased airspace consolidation in the right lower lobe. There is patchy airspace consolidation in the left lower lobe and left upper lobe regions. There has been clearing of patchy opacity from the right upper lobe since recent prior study. There is a layering  pleural effusion on the left. Heart is mildly enlarged. The pulmonary vascularity is within normal limits. There is extensive atherosclerotic calcification throughout the aorta. No bone lesions are evident. IMPRESSION: Multifocal areas of infiltrate, likely multifocal pneumonia. There is a left pleural effusion with mild cardiomegaly.  There may be a degree of underlying congestive heart failure. There is aortic atherosclerosis. Electronically Signed   By: Bretta Bang III M.D.   On: 12/16/2015 10:29    EKG:   Orders placed or performed during the hospital encounter of 12/13/15  . EKG 12-Lead  . EKG 12-Lead  . EKG 12-Lead  . EKG 12-Lead  . ED EKG 12-Lead  . ED EKG 12-Lead    ASSESSMENT AND PLAN:    * Acute hypoxic respiratory failure due to acute on chronic diastolic CHF On lasix. I/Os Monitor Cr. Recent echo reviewed. Repeat CXR today.  * Acute cystitis recurrent - Infectious? Stopped meropenem as per ID. Prior cultures have been negative. Urine culture growing yeast which is likely colonized. During previous admission patient was treated with aztreonam and discharged home with by mouth Bactrim for 14 days. Also foley placed due to urinary retention. Appreciate infectious disease input. Discussed with Dr. Apolinar Junes of urology. Advised outpatient follow-up for cystoscopy. Will continue foley. Was changed in the emergency room  * Generalized weakness due to dehydration and acute renal failure Improving   * Atrial fibrillation  On rate controlling medications and Eliquis.  *Chronic history  of esophageal spasms Outpatient follow-up with [facility] gastroenterology as recommended Continue current diet as tolerated  * Acute kidney injury over CKD stage III - Due to dehydration and bactrim Improving  * Hypoglycemia with diabetes mellitus This is resolved. Patient was on D5 which was stopped. Restarted glipizide at 2.5 mg daily.  * Hyperlipidemia  Continue statin.  * Arthritis  Continue home medications.  All the records are reviewed and case discussed with Care Management/Social Workerr. Management plans discussed with the daughter and is in agreement.  Discussed with daughter and son regarding consulting palliative care patient has been slowly worsening over last few weeks.  CODE STATUS:  DNR  TOTAL TIME TAKING CARE OF THIS PATIENT: 35 minutes.   POSSIBLE D/C IN 1-2 DAYS, DEPENDING ON CLINICAL CONDITION to SNF  Note: This dictation was prepared with Dragon dictation along with smaller phrase technology. Any transcriptional errors that result from this process are unintentional.   Milagros Loll R M.D on 12/17/2015 at 9:22 AM  Between 7am to 6pm - Pager - [phone]  After 6pm go to [email or web address] - password [email or web address] [facility]  [facility]  Office  [phone]  CC: Primary care physician; Barbette Reichmann, MD

## 2015-12-17 NOTE — Progress Notes (Signed)
Pt bp 138/55 HR 63, notified MD of due lopressor, ordered to go ahead and give

## 2015-12-17 NOTE — Progress Notes (Signed)
Patient is minimally responsive and open eyes only with movement and stimuli. HOH. Patient hadn't slept for days and was agitated yesterday PM shift and this AM during repeat x-ray.  Gave Temazepam 7.5mg  7/8 @ 2122. VSS. Blood sugars staple. On tele, running A.Fib HR90's. MD made aware. New orders to hold meds until patient is awake enough to take them.

## 2015-12-18 ENCOUNTER — Telehealth: Payer: Self-pay | Admitting: Radiology

## 2015-12-18 LAB — CULTURE, BLOOD (ROUTINE X 2)
CULTURE: NO GROWTH
Culture: NO GROWTH

## 2015-12-18 LAB — CBC WITH DIFFERENTIAL/PLATELET
BASOS ABS: 0.1 10*3/uL (ref 0–0.1)
BASOS PCT: 2 %
Eosinophils Absolute: 0.6 10*3/uL (ref 0–0.7)
Eosinophils Relative: 10 %
HEMATOCRIT: 22.3 % — AB (ref 35.0–47.0)
HEMOGLOBIN: 7.2 g/dL — AB (ref 12.0–16.0)
LYMPHS PCT: 13 %
Lymphs Abs: 0.8 10*3/uL — ABNORMAL LOW (ref 1.0–3.6)
MCH: 35.1 pg — ABNORMAL HIGH (ref 26.0–34.0)
MCHC: 32.3 g/dL (ref 32.0–36.0)
MCV: 108.6 fL — AB (ref 80.0–100.0)
MONOS PCT: 9 %
Monocytes Absolute: 0.5 10*3/uL (ref 0.2–0.9)
NEUTROS ABS: 4.1 10*3/uL (ref 1.4–6.5)
NEUTROS PCT: 66 %
Platelets: 215 10*3/uL (ref 150–440)
RBC: 2.06 MIL/uL — ABNORMAL LOW (ref 3.80–5.20)
RDW: 22 % — ABNORMAL HIGH (ref 11.5–14.5)
WBC: 6.2 10*3/uL (ref 3.6–11.0)

## 2015-12-18 LAB — GLUCOSE, CAPILLARY
GLUCOSE-CAPILLARY: 96 mg/dL (ref 65–99)
GLUCOSE-CAPILLARY: 98 mg/dL (ref 65–99)
Glucose-Capillary: 102 mg/dL — ABNORMAL HIGH (ref 65–99)
Glucose-Capillary: 149 mg/dL — ABNORMAL HIGH (ref 65–99)

## 2015-12-18 LAB — BASIC METABOLIC PANEL
ANION GAP: 2 — AB (ref 5–15)
BUN: 34 mg/dL — ABNORMAL HIGH (ref 6–20)
CHLORIDE: 108 mmol/L (ref 101–111)
CO2: 32 mmol/L (ref 22–32)
Calcium: 8.7 mg/dL — ABNORMAL LOW (ref 8.9–10.3)
Creatinine, Ser: 1.6 mg/dL — ABNORMAL HIGH (ref 0.44–1.00)
GFR calc non Af Amer: 27 mL/min — ABNORMAL LOW (ref >60)
GFR, EST AFRICAN AMERICAN: 32 mL/min — AB (ref >60)
Glucose, Bld: 114 mg/dL — ABNORMAL HIGH (ref 65–99)
Potassium: 4.3 mmol/L (ref 3.5–5.1)
Sodium: 142 mmol/L (ref 135–145)

## 2015-12-18 MED ORDER — SODIUM CHLORIDE 0.9 % IV SOLN: Freq: Once | INTRAVENOUS | Status: DC

## 2015-12-18 MED ORDER — SCOPOLAMINE 1 MG/3DAYS TD PT72
1.0000 | MEDICATED_PATCH | TRANSDERMAL | Status: DC
  Administered 2015-12-18: 1.5 mg via TRANSDERMAL
  Filled 2015-12-18: qty 1

## 2015-12-18 MED ORDER — MORPHINE SULFATE (CONCENTRATE) 10 MG/0.5ML PO SOLN: 10.0000 mg | ORAL | Status: DC | PRN

## 2015-12-18 NOTE — Care Management Important Message (Signed)
Important Message  Patient Details  Name: RACHELLE EDWARDS MRN: [id number] Date of Birth: 09-29-1923   Medicare Important Message Given:  Yes    Adonis Huguenin, RN 12/18/2015, 10:29 AM

## 2015-12-18 NOTE — Progress Notes (Addendum)
[facility] - [place] at [facility]   PATIENT NAME: Kelsey Chung    MR#:  [id number]  DATE OF BIRTH:  12-19-1923  SUBJECTIVE:  CHIEF COMPLAINT:   Chief Complaint  Patient presents with  . Altered Mental Status  Patient is minimally responsive and open eyes only with movement and stimuli, clearing her throat very frequently. REVIEW OF SYSTEMS:  Review of Systems  Unable to perform ROS: mental acuity   DRUG ALLERGIES:   Allergies  Allergen Reactions  . Augmentin [Amoxicillin-Pot Clavulanate] Anaphylaxis and Other (See Comments)    Has patient had a PCN reaction causing immediate rash, facial/tongue/throat swelling, SOB or lightheadedness with hypotension: Yes Has patient had a PCN reaction causing severe rash involving mucus membranes or skin necrosis: No Has patient had a PCN reaction that required hospitalization No Has patient had a PCN reaction occurring within the last 10 years: Yes If all of the above answers are "NO", then may proceed with Cephalosporin use.  . Beta Adrenergic Blockers Other (See Comments)    Reaction:  Unknown  . Clindamycin Other (See Comments)    Reaction:  Redness to face/swollen eyes   . Excedrin Extra Strength [Aspirin-Acetaminophen-Caffeine] Diarrhea  . Keflex [Cephalexin] Swelling and Other (See Comments)    Reaction:  Lip swelling   . Meloxicam Other (See Comments)    Reaction:  Decreased renal function  . Methotrexate Sodium Other (See Comments)    Reaction:  Decreased renal function  . Nsaids Other (See Comments)    Reaction:  Unknown   . Quinine Rash  . Quinolones Rash   VITALS:  Blood pressure 131/43, pulse 103, temperature 97.8 F (36.6 C), temperature source Axillary, resp. rate 18, height 4\' 11"  (1.499 m), weight 82.691 kg (182 lb 4.8 oz), SpO2 99 %. PHYSICAL EXAMINATION:  Physical Exam  Constitutional: She appears lethargic and malnourished. She appears unhealthy.  HENT:  Head: Normocephalic and atraumatic.   Eyes: Conjunctivae and EOM are normal. Pupils are equal, round, and reactive to light.  Neck: Normal range of motion. Neck supple. No tracheal deviation present. No thyromegaly present.  Cardiovascular: Normal heart sounds.  An irregularly irregular rhythm present.  Pulmonary/Chest: Effort normal. No respiratory distress. She has decreased breath sounds in the right lower field and the left lower field. She has no wheezes. She exhibits no tenderness.  Abdominal: Soft. Bowel sounds are normal. She exhibits no distension. There is no tenderness.  Musculoskeletal: Normal range of motion.  Neurological: She appears lethargic. She is disoriented. No cranial nerve deficit.  Patient is minimally responsive and open eyes only with movement and stimuli  Skin: Skin is warm and dry. No rash noted.  Psychiatric: Mood and affect normal.  Unable to assess due to mental condition   LABORATORY PANEL:   CBC  Recent Labs Lab 12/18/15 0308  WBC 6.2  HGB 7.2*  HCT 22.3*  PLT 215   ------------------------------------------------------------------------------------------------------------------ Chemistries   Recent Labs Lab 12/13/15 1754  12/18/15 0308  NA 139  < > 142  K 5.3*  < > 4.3  CL 104  < > 108  CO2 28  < > 32  GLUCOSE 104*  < > 114*  BUN 52*  < > 34*  CREATININE 2.80*  < > 1.60*  CALCIUM 9.5  < > 8.7*  AST 22  --   --   ALT 15  --   --   ALKPHOS 85  --   --   BILITOT 0.2*  --   --   < > =  values in this interval not displayed. RADIOLOGY:  No results found. ASSESSMENT AND PLAN:  * Acute hypoxic respiratory failure due to acute on chronic diastolic CHF Now comfort care only - We will start concentrated morphine solution and scopolamine patch for comfort care  * Acute cystitis recurrent - Per infectious disease.  This is not recurrent infection - Could be colonization - Urologist recommended outpatient cystoscopy, but family not interested in any further intervention - Now  comfort care only - We will leave indwelling Foley for comfort care  * Generalized weakness due to dehydration and acute renal failure - Now comfort care only   * Atrial fibrillation - Stop rate controlling medications and Eliquis.,  Risks and benefits explained to family and they r in agreement - they understand, She could have stroke   *Chronic history  of esophageal spasms Outpatient follow-up with [facility] gastroenterology as recommended -  Evaluation for diet consistency  * Acute kidney injury over CKD stage III -   * Hypoglycemia with diabetes mellitus - Stop  Glipizide  * Hyperlipidemia - Now comfort care only  * Arthritis  * Anemia of chronic kidney disease - Hemoglobin 7.2, we will hold off transfusion as she is Now comfort care only    After long discussion with patient's family, decision was made to keep her comfortable only, stopping all of her medications except Seroquel, Restoril and Requip.  Family in agreement with the same.  Requested hospice evaluation and screening for home.  Family also requesting a hospital bed.  Discussed with care management, who is working on same   All the records are reviewed and case discussed with Care Management/Social Worker. Management plans discussed with the patient, family (stressed with daughter, Kelsey Chung at bedside) and she is  in agreement.  CODE STATUS: DO NOT RESUSCITATE, after discussion with family.  Now she is comfort care only  TOTAL TIME TAKING CARE OF THIS PATIENT: 45 minutes.   More than 50% of the time was spent in counseling/coordination of care: YES  POSSIBLE D/C IN a.m., DEPENDING ON CLINICAL CONDITION. and hospice evaluation    [facility], Jamorris Ndiaye M.D on 12/18/2015 at 11:09 AM  Between 7am to 6pm - Pager - [phone]  After 6pm go to [email or web address] - Social research officer, government  [facility]  Office  [phone]  CC: Primary care physician; Barbette Reichmann, MD  Note: This dictation was  prepared with Dragon dictation along with smaller phrase technology. Any transcriptional errors that result from this process are unintentional.

## 2015-12-18 NOTE — Progress Notes (Addendum)
Pt is comfort care only. Order received for telemetry unit to be discontinued.central monitoring notified

## 2015-12-18 NOTE — Care Management Note (Signed)
Case Management Note  Patient Details  Name: Kelsey Chung MRN: [id number] Date of Birth: [birth year]  Subjective/Objective:     Spoke with daughter at the bedside for discharge plan. Speech has evaluated patient this am and rounding physician has placed patient on comfort care after discussion with daughter [name].    Plan at this time is for Home with Hospice . Patient will return home with daughter at discharge.  Case discussed with attending Dr Sherryll Burger. Choice of Hospice providers offered chose [facility] [address].referral placed with Dayna Barker      Action/Plan:   Expected Discharge Date:                  Expected Discharge Plan:  Home w Hospice Care  In-House Referral:     Discharge planning Services  CM Consult  Post Acute Care Choice:    Choice offered to:     DME Arranged:    DME Agency:     HH Arranged:    HH Agency:     Status of Service:  In process, will continue to follow  If discussed at Long Length of Stay Meetings, dates discussed:    Additional Comments:  Adonis Huguenin, RN 12/18/2015, 11:09 AM

## 2015-12-18 NOTE — Progress Notes (Addendum)
Speech Therapy Note: received order, reviewed chart notes; consulted NSG. MD in pt's room currently; NSG indicated discussion re: pt's plan of care and status then suggested holding on the BSE at this time.  Pt does have a baseline h/o Esophageal dysmotility which the family is aware of. This may be impacting her oral intake (desire) overall.  During discussion w/ NSG, SLP recommended encouraging more liquids and puree foods (moistened/loose) for easier Esophageal clearing for pt - following strict aspiration precautions. Possibly more foods such as Puddings, Yogurt, Cream Soups, Smoothies, and any liquids but all po's in small amounts to allow for Esophageal clearing. Recommended medications crushed in puree or in liquid form. NSG agreed.  ST services will be available for BSE and further education as needed tomorrow.

## 2015-12-18 NOTE — Progress Notes (Signed)
Per MD, ok to hold BP meds. Cancelled several medications, pt status changed to be comfort care.

## 2015-12-18 NOTE — Telephone Encounter (Signed)
-----   Message from Vanna Scotland, MD sent at 12/18/2015  3:16 PM EDT ----- Regarding: RE: Reschedule f/u with Dr. Sherryl Barters No f/u needed.  Vanna Scotland, MD  ----- Message -----    From: Nicolasa Ducking, RN    Sent: 12/18/2015   2:34 PM      To: Vanna Scotland, MD Subject: RE: Reschedule f/u with Dr. Sherryl Barters             This pt is being made comfort care only as of today.  Do we still need to move this appt up or cancel it?   ----- Message -----    From: Vanna Scotland, MD    Sent: 12/15/2015  12:57 PM      To: Correne Lalani Georgie Chard, RN Subject: Reschedule f/u with Dr. Sherryl Barters                 Can you please try to move up her f/u for sooner than scheduled by 1-2 weeks with Dr. Sherryl Barters?  This was requested by the hospitalist.    Vanna Scotland, MD

## 2015-12-18 NOTE — Progress Notes (Signed)
New referral for [facility] at home following discharge received from [facility]. [name] is an 82 year old woman admitted to [facility] on 7/5 with weakness, AMS and acute kidney injury. She has received IV fluids, but has continued to decline, developing a fib and continues with pleural effusions.  Her daughter has chosen to focus on her comfort and take her home with hospice services. Patient seen lying in bed, alert, able to answer yes no questions, oriented to person, daughter Duncan Dull at bedside. Writer spoke to [employer] in the room to initiate education regarding hospice services, philosophy and team approach to care with good understanding voiced. [name]'s husband received [facility] services about 2 years ago and died at home. Sandi has requested a hospital bed, fully electric and oxygen to be delivered to her home tomorrow before 12 noon. Furniture will need to be moved prior to delivery. DME needs to be in place prior to discharge. All information faxed to Hospice referral. Patient will require EMS transport and a signed DNR. CMRN Raiford Noble made aware. Will continue you to follow through final disposition. Thank you. Dayna Barker RN, BSN, [facility] [place], [facility] [phone] c

## 2015-12-18 NOTE — Progress Notes (Signed)
   12/18/15 1400  Clinical Encounter Type  Visited With Patient  Visit Type Initial  Consult/Referral To Chaplain  Rounded in unit and offered pastoral care.   Chaplain Sheron Robin Ext:[phone] 

## 2015-12-18 NOTE — Progress Notes (Signed)
Pt had no urine output this am with foley catheter in place, bladder scanned pt with in bladder. Adjusted foley and pt and foley began to drain. Drained of urine. Will cont to monitor

## 2015-12-19 LAB — GLUCOSE, CAPILLARY
GLUCOSE-CAPILLARY: 241 mg/dL — AB (ref 65–99)
Glucose-Capillary: 113 mg/dL — ABNORMAL HIGH (ref 65–99)

## 2015-12-19 MED ORDER — LACTULOSE 10 GM/15ML PO SOLN
10.0000 g | Freq: Every day | ORAL | Status: DC | PRN
  Administered 2015-12-19: 10 g via ORAL
  Filled 2015-12-19: qty 30

## 2015-12-19 MED ORDER — MORPHINE SULFATE (CONCENTRATE) 10 MG/0.5ML PO SOLN: 10.0000 mg | ORAL | Status: AC | PRN

## 2015-12-19 MED ORDER — SCOPOLAMINE 1 MG/3DAYS TD PT72: 1.0000 | MEDICATED_PATCH | TRANSDERMAL | Status: AC

## 2015-12-19 MED ORDER — GABAPENTIN 600 MG PO TABS: 300.0000 mg | ORAL_TABLET | Freq: Three times a day (TID) | ORAL | Status: AC

## 2015-12-19 MED ORDER — BISACODYL 10 MG RE SUPP
10.0000 mg | Freq: Once | RECTAL | Status: DC
  Filled 2015-12-19: qty 1

## 2015-12-19 MED ORDER — MORPHINE SULFATE (CONCENTRATE) 10 MG/0.5ML PO SOLN: 10.0000 mg | ORAL | Status: DC | PRN

## 2015-12-19 NOTE — Progress Notes (Signed)
Dr. Sherryll Burger cancelled that were requested, please continue other medications as ordered.

## 2015-12-19 NOTE — Progress Notes (Signed)
Pt being discharged to home. EMS will transport. [facility] has set-up equipment at home.

## 2015-12-19 NOTE — Progress Notes (Signed)
PT Cancellation Note  Patient Details Name: Kelsey Chung MRN: [id number] DOB: [birth year]   Cancelled Treatment:    Reason Eval/Treat Not Completed: Other (comment).  Per chart, pt is now comfort care only.  Per PT protocol, will discontinue PT order.  Please re-consult PT if POC changes and pt needs are deemed appropriate for PT.   [name] 12/19/2015, 8:40 AM [name], PT [phone]

## 2015-12-19 NOTE — Progress Notes (Signed)
Received report from Teresa,RN. Nursing care resumed.Previous nurse received order for comfort care. Comfort care continued. Family at bedside. Notified in report that patient medication was canceled by MD. Upon arrival to room family requested not to awaken patient for medication administration.

## 2015-12-19 NOTE — Progress Notes (Signed)
Follow up visit made to new referral for [facility] following discharge. Patient seen sitting up in bed, alert and interactive, ready to return home. Daughter Sandi at bedside. All DMe has been delivered. CMRN Raiford Noble and staff RN Verlon Au aware. EMS to transfer patient home. Sandi has all contact information for hospice. Updated pagtient information faxed to referral. Thank you. Dayna Barker RN, BSN, [facility] [place], [facility] [phone] c

## 2015-12-19 NOTE — Discharge Instructions (Signed)
Hospice Hospice is a service that is designed to provide people who are terminally ill and their families with medical, spiritual, and psychological support. Its aim is to improve your quality of life by keeping you as alert and comfortable as possible. Hospice is performed by a team of health care professionals and volunteers who:  Help keep you comfortable. Hospice can be provided in your home or in a homelike setting. The hospice staff works with your family and friends to help meet your needs. You will enjoy the support of loved ones by receiving much of your basic care from family and friends.  Provide pain relief and manage your symptoms. The staff supply all necessary medicines and equipment.  Provide companionship when you are alone.  Allow you and your family to rest. They may do light housekeeping, prepare meals, and run errands.  Provide counseling. They will make sure your emotional, spiritual, and social needs and those of your family are being met.  Provide spiritual care. Spiritual care is individualized to meet your needs and your family's needs. It may involve helping you look at what death means to you, say goodbye, or perform a specific religious ceremony or ritual. Hospice teams often include:  A nurse.  A doctor.  Social workers.  Religious leaders (such as a Clinical biochemist).  Trained volunteers. WHEN SHOULD HOSPICE CARE BEGIN? Most people who use hospice are believed to have fewer than 6 months to live. Your family and health care providers can help you decide when hospice services should begin. If your condition improves, you may discontinue the program. WHAT [place]? Most hospice programs are run by nonprofit, independent organizations. Some are affiliated with hospitals, nursing homes, or home health care agencies. Hospice programs can take place in the home or at a hospice center, hospital, or skilled nursing facility. When choosing  a hospice program, ask the following questions:  What services are available to me?  What services are offered to my loved ones?  How involved are my loved ones?  How involved is my health care provider?  Who makes up the hospice care team? How are they trained or screened?  How will my pain and symptoms be managed?  If my circumstances change, can the services be provided in a different setting, such as my home or in the hospital?  Is the program reviewed and licensed by the state or certified in some other way? WHERE CAN I LEARN MORE ABOUT HOSPICE? You can learn about existing hospice programs in your area from your health care providers. You can also read more about hospice online. The websites of the following organizations contain helpful information:  The [facility] and Palliative Care Organization [facility]).  The Hospice Association of America ([place]).  The [place].  The American Cancer Society (ACS).  Hospice Net.   This information is not intended to replace advice given to you by your health care provider. Make sure you discuss any questions you have with your health care provider.   Document Released: 09/13/2003 Document Revised: 06/01/2013 Document Reviewed: 04/06/2013 Elsevier Interactive Patient Education [employer].

## 2016-01-04 ENCOUNTER — Ambulatory Visit: Payer: Medicare Other

## 2016-01-09 NOTE — Discharge Summary (Signed)
[facility] [place] [facility]   PATIENT NAME: Kelsey Chung    MR#:  [id number]  DATE OF BIRTH:  02/26/1924  DATE OF ADMISSION:  12/13/2015 ADMITTING PHYSICIAN: Wyatt Haste, MD  DATE OF DISCHARGE: 12/19/2015  2:54 PM  PRIMARY CARE PHYSICIAN: Barbette Reichmann, MD    ADMISSION DIAGNOSIS:  Healthcare-associated pneumonia [J18.9] AKI (acute kidney injury) (HCC) [N17.9] Hypothermia, initial encounter [T68.XXXA] Sepsis, due to unspecified organism (HCC) [A41.9]  DISCHARGE DIAGNOSIS:  Active Problems:   AKI (acute kidney injury) (HCC)   Hypoglycemia  SECONDARY DIAGNOSIS:   Past Medical History  Diagnosis Date  . Diabetes (HCC)   . Hypertension   . Rheumatoid arthritis(714.0)   . Hyperlipidemia   . Gout   . Osteoarthritis (arthritis due to wear and tear of joints)   . Scoliosis   . Constipation, chronic   . CKD (chronic kidney disease) stage 4, GFR 15-29 ml/min (HCC)   . New onset atrial fibrillation (HCC)     a. diagnosed in 11/2015 - admitted for sepsis  . TIA (transient ischemic attack)     a. multiple TIA's 20+ years ago according to patient's daughter    HOSPITAL COURSE:  82 y.o. female with a known history of Type 2 diabetes non-insulin-requiring, recent urinary retention requiring catheter presenting with altered mental status described as somnolent/lethargy and was found to have hypoglycemia.  * Acute hypoxic respiratory failure due to acute on chronic diastolic CHF  * Acute cystitis recurrent - Ruled out, likely colonization per ID  * Generalized weakness due to dehydration and acute renal failure   * Atrial fibrillation  *Chronic history of esophageal spasms  * Acute kidney injury over CKD stage III   * Hypoglycemia with diabetes mellitus - Stopped Glipizide  * Hyperlipidemia - Now comfort care only  * Arthritis  * Anemia of chronic kidney disease   After long discussion with patient's family, decision was  made to keep her comfortable only, stopping all of her medications except Seroquel, Restoril and Requip. Family in agreement with the same. Requested hospice at home. Family also requesting a hospital bed which is provided.  DISCHARGE CONDITIONS:   fair  CONSULTS OBTAINED:  Treatment Team:  Wyatt Haste, MD Vanna Scotland, MD  DRUG ALLERGIES:   Allergies  Allergen Reactions  . Augmentin [Amoxicillin-Pot Clavulanate] Anaphylaxis and Other (See Comments)    Has patient had a PCN reaction causing immediate rash, facial/tongue/throat swelling, SOB or lightheadedness with hypotension: Yes Has patient had a PCN reaction causing severe rash involving mucus membranes or skin necrosis: No Has patient had a PCN reaction that required hospitalization No Has patient had a PCN reaction occurring within the last 10 years: Yes If all of the above answers are "NO", then may proceed with Cephalosporin use.  . Beta Adrenergic Blockers Other (See Comments)    Reaction:  Unknown  . Clindamycin Other (See Comments)    Reaction:  Redness to face/swollen eyes   . Excedrin Extra Strength [Aspirin-Acetaminophen-Caffeine] Diarrhea  . Keflex [Cephalexin] Swelling and Other (See Comments)    Reaction:  Lip swelling   . Meloxicam Other (See Comments)    Reaction:  Decreased renal function  . Methotrexate Sodium Other (See Comments)    Reaction:  Decreased renal function  . Nsaids Other (See Comments)    Reaction:  Unknown   . Quinine Rash  . Quinolones Rash    DISCHARGE MEDICATIONS:   Discharge Medication List as of 12/19/2015  1:48  PM    START taking these medications   Details  scopolamine (TRANSDERM-SCOP) 1 MG/3DAYS Place 1 patch (1.5 mg total) onto the skin every 3 (three) days., Starting 12/19/2015, Until Discontinued, Normal    Morphine Sulfate (MORPHINE CONCENTRATE) 10 MG/0.5ML SOLN concentrated solution Take 0.5 mLs (10 mg total) by mouth every 2 (two) hours as needed for severe pain,  anxiety or shortness of breath., Starting 12/19/2015, Until Discontinued, Print      CONTINUE these medications which have CHANGED   Details  gabapentin (NEURONTIN) 600 MG tablet Take 0.5 tablets (300 mg total) by mouth 3 (three) times daily., Starting 12/19/2015, Until Discontinued, Normal      CONTINUE these medications which have NOT CHANGED   Details  allopurinol (ZYLOPRIM) 100 MG tablet Take 100 mg by mouth 2 (two) times daily. , Until Discontinued, Historical Med    lactulose (CHRONULAC) 10 GM/15ML solution Take 20 g by mouth 2 (two) times daily. , Until Discontinued, Historical Med    QUEtiapine (SEROQUEL) 25 MG tablet Take 25 mg by mouth at bedtime., Until Discontinued, Historical Med    rOPINIRole (REQUIP) 0.5 MG tablet Take 0.5 mg by mouth at bedtime., Until Discontinued, Historical Med      STOP taking these medications     apixaban (ELIQUIS) 2.5 MG TABS tablet      aspirin EC 81 MG tablet      diltiazem (CARDIZEM CD) 240 MG 24 hr capsule      ferrous sulfate 325 (65 FE) MG tablet      glipiZIDE (GLUCOTROL) 5 MG tablet      hydrochlorothiazide (HYDRODIURIL) 25 MG tablet      hydroxychloroquine (PLAQUENIL) 200 MG tablet      isosorbide mononitrate (IMDUR) 30 MG 24 hr tablet      metoprolol tartrate (LOPRESSOR) 25 MG tablet      mupirocin cream (BACTROBAN) 2 %      pantoprazole (PROTONIX) 40 MG tablet      sulfamethoxazole-trimethoprim (BACTRIM DS,SEPTRA DS) 800-160 MG tablet      traMADol (ULTRAM) 50 MG tablet        DISCHARGE INSTRUCTIONS:    DIET:  Encourage fluids  DISCHARGE CONDITION:  Fair  ACTIVITY:  Activity as tolerated  OXYGEN:  Home Oxygen: No.   Oxygen Delivery: room air  DISCHARGE LOCATION:  Home with Hospice   If you experience worsening of your admission symptoms, develop shortness of breath, life threatening emergency, suicidal or homicidal thoughts you must seek medical attention immediately by calling 911 or calling your MD  immediately  if symptoms less severe.  You Must read complete instructions/literature along with all the possible adverse reactions/side effects for all the Medicines you take and that have been prescribed to you. Take any new Medicines after you have completely understood and accpet all the possible adverse reactions/side effects.   Please note  You were cared for by a hospitalist during your hospital stay. If you have any questions about your discharge medications or the care you received while you were in the hospital after you are discharged, you can call the unit and asked to speak with the hospitalist on call if the hospitalist that took care of you is not available. Once you are discharged, your primary care physician will handle any further medical issues. Please note that NO REFILLS for any discharge medications will be authorized once you are discharged, as it is imperative that you return to your primary care physician (or establish a relationship with a  primary care physician if you do not have one) for your aftercare needs so that they can reassess your need for medications and monitor your lab values.    On the day of Discharge:  VITAL SIGNS:  Blood pressure 133/80, pulse 107, temperature 98.9 F (37.2 C), temperature source Axillary, resp. rate 16, height 4\' 11"  (1.499 m), weight 82.691 kg (182 lb 4.8 oz), SpO2 98 %. PHYSICAL EXAMINATION:  Constitutional: She appears lethargic and malnourished. She appears unhealthy.  HENT:  Head: Normocephalic and atraumatic.  Eyes: Conjunctivae and EOM are normal. Pupils are equal, round, and reactive to light.  Neck: Normal range of motion. Neck supple. No tracheal deviation present. No thyromegaly present.  Cardiovascular: Normal heart sounds. An irregularly irregular rhythm present.  Pulmonary/Chest: Effort normal. No respiratory distress. She has decreased breath sounds in the right lower field and the left lower field. She has no wheezes.  She exhibits no tenderness.  Abdominal: Soft. Bowel sounds are normal. She exhibits no distension. There is no tenderness.  Musculoskeletal: Normal range of motion.  Neurological: She appears lethargic. She is disoriented. No cranial nerve deficit.  Patient is minimally responsive and open eyes only with movement and stimuli  Skin: Skin is warm and dry. No rash noted.  Psychiatric: Mood and affect normal.  Unable to assess due to mental condition  DATA REVIEW:   CBC  Recent Labs Lab 12/18/15 0308  WBC 6.2  HGB 7.2*  HCT 22.3*  PLT 215    Chemistries   Recent Labs Lab 12/18/15 0308  NA 142  K 4.3  CL 108  CO2 32  GLUCOSE 114*  BUN 34*  CREATININE 1.60*  CALCIUM 8.7*     Management plans discussed with the patient, family and they are in agreement.  CODE STATUS: DNR, Comfort care (Home with Hospice)  TOTAL TIME TAKING CARE OF THIS PATIENT: 45 minutes.    [facility], Miko Sirico M.D on 01-04-16 at 10:54 AM  Between 7am to 6pm - Pager - [phone]  After 6pm go to [email or web address] - password [email or web address]  05-23-1978 [facility]  Office  [phone]  CC: Primary care physician; [phone], MD   Note: This dictation was prepared with Dragon dictation along with smaller phrase technology. Any transcriptional errors that result from this process are unintentional.

## 2016-01-09 DEATH — deceased

## 2016-01-17 ENCOUNTER — Ambulatory Visit: Payer: Medicare Other | Admitting: Podiatry

## 2018-06-16 IMAGING — CT CT HEAD W/O CM
3 of 4 series · 15 of 47 positions shown, 18 images · non-contrast
Comparison: Head CT 11/30/2015.

CLINICAL DATA: Altered mental status and abdominal distention.

EXAM:
CT HEAD WITHOUT CONTRAST
TECHNIQUE: Contiguous axial images were obtained from the base of the skull
through the vertex without intravenous contrast.

[Series 2: head wo · axial · 0.39mm/px · z∈[+381,+506]mm · 9 of 31 slices shown, 12 images]
[im 3/31  brain]
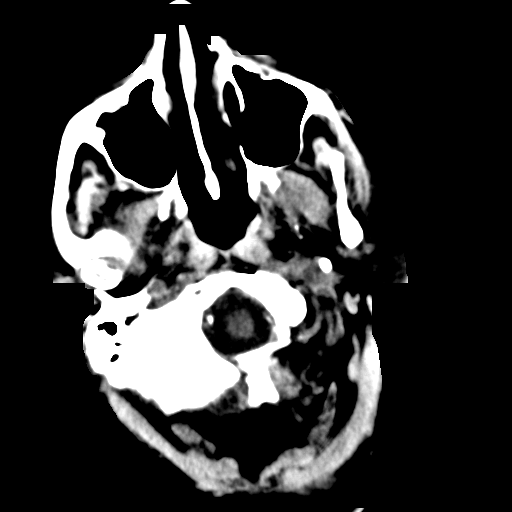
[im 3/31  bone]
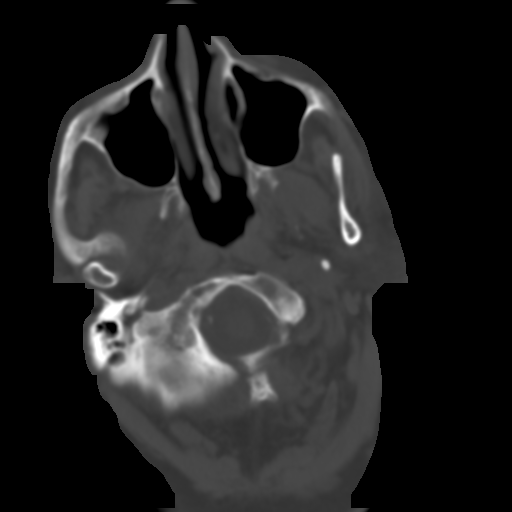
[im 7/31  brain]
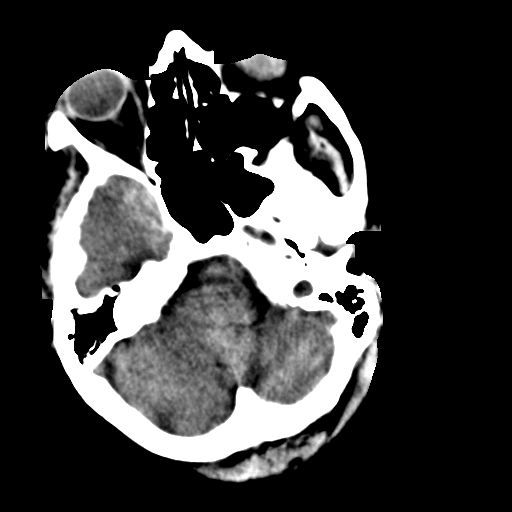
[im 9/31  brain]
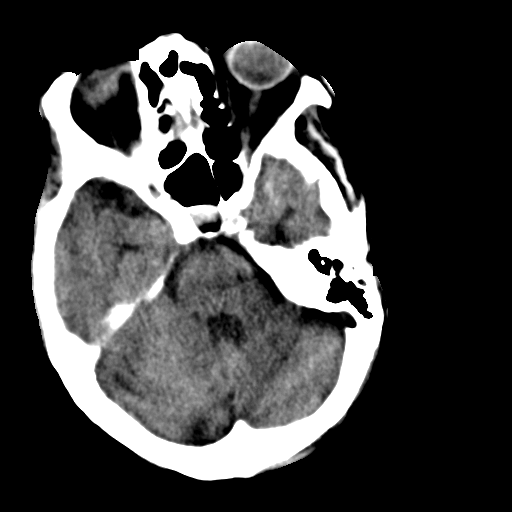
[im 13/31  brain]
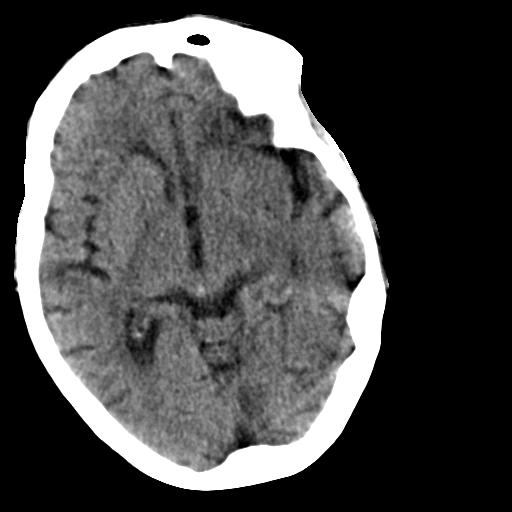
[im 16/31  brain]
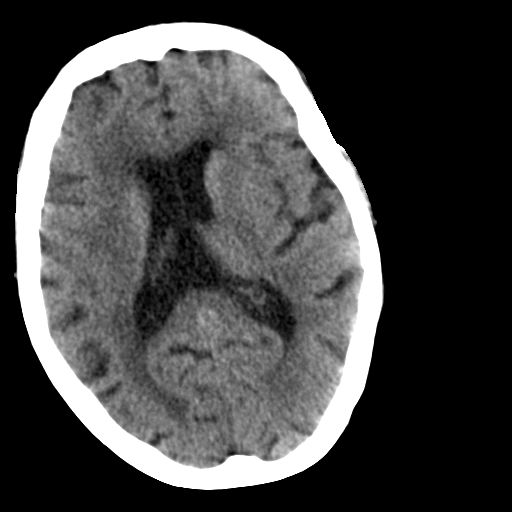
[im 16/31  bone]
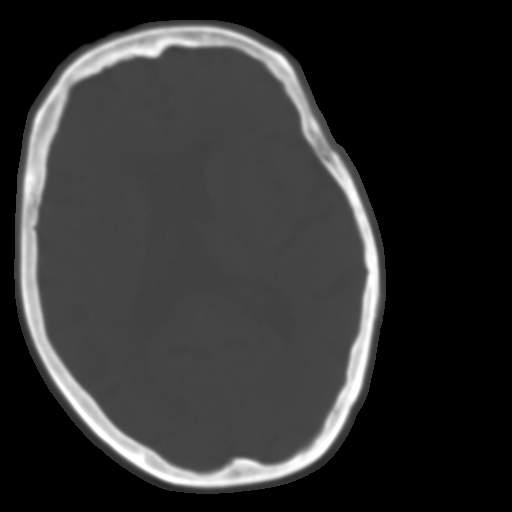
[im 18/31  brain]
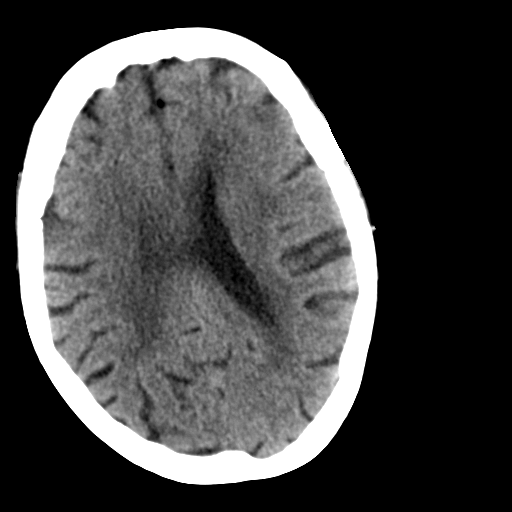
[im 22/31  brain]
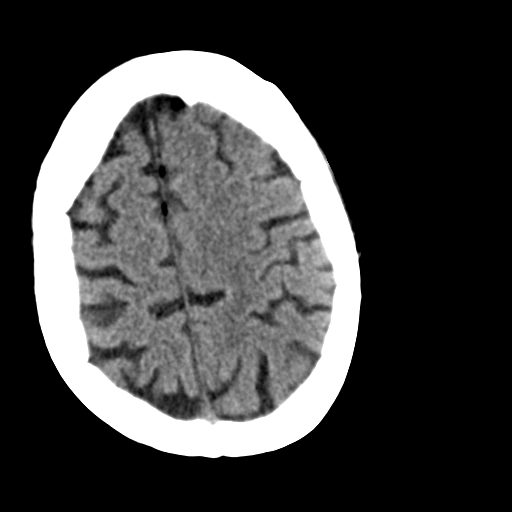
[im 24/31  brain]
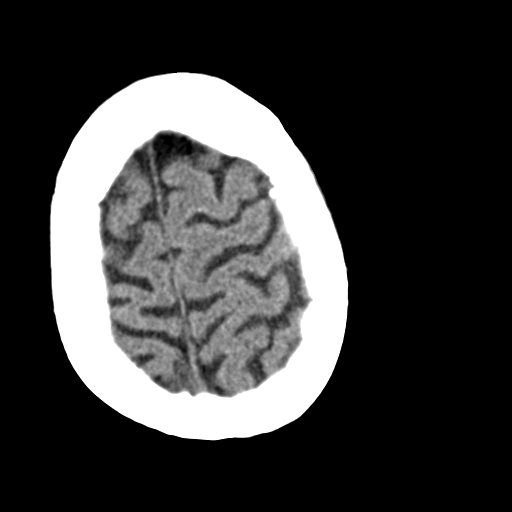
[im 28/31  brain]
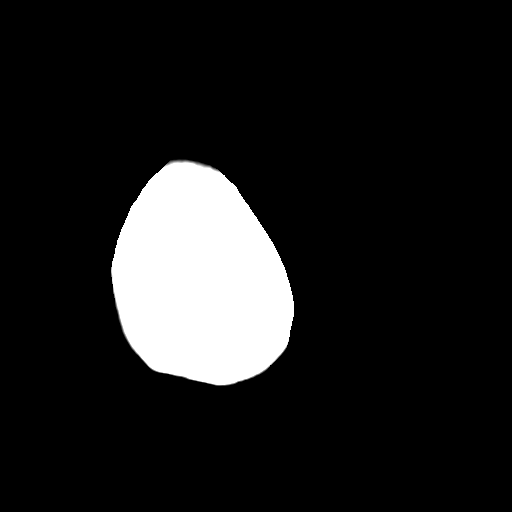
[im 28/31  bone]
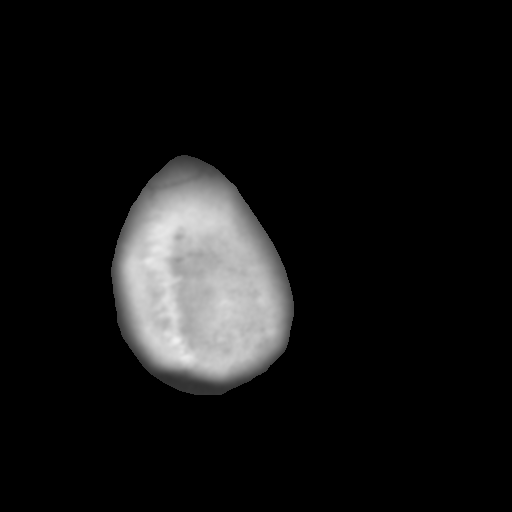

[Series 6: coronal soft tissue · coronal · 0.31mm/px · 3 of 63 slices shown]
[im 21/63  brain]
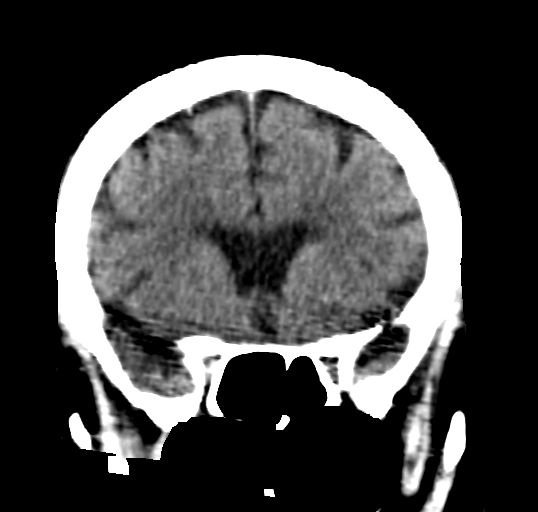
[im 28/63  brain]
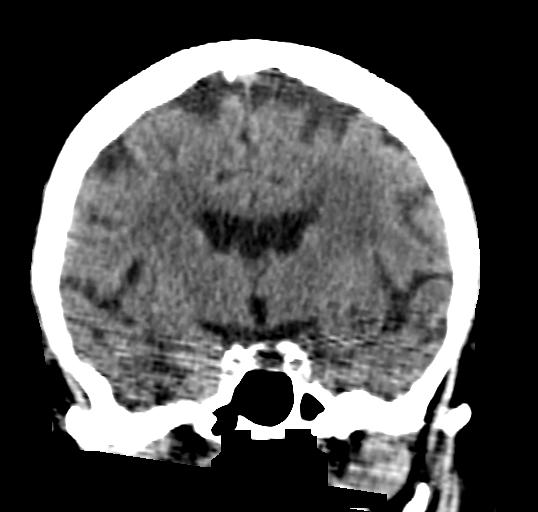
[im 35/63  brain]
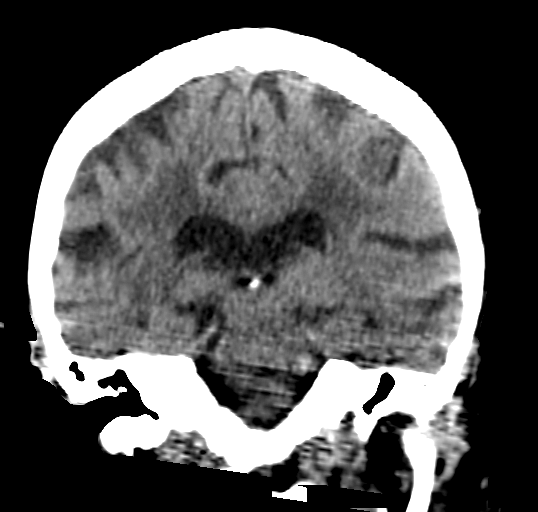

[Series 7: sagittal soft tissue · sagittal · 0.30mm/px · 3 of 49 slices shown]
[im 18/49  brain]
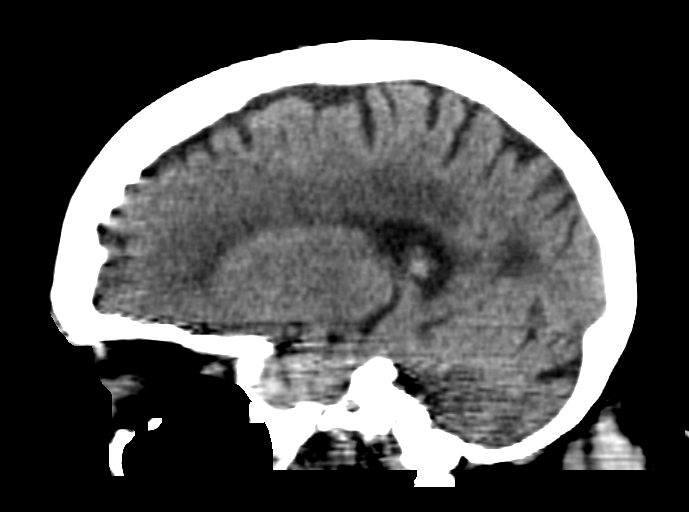
[im 25/49  brain]
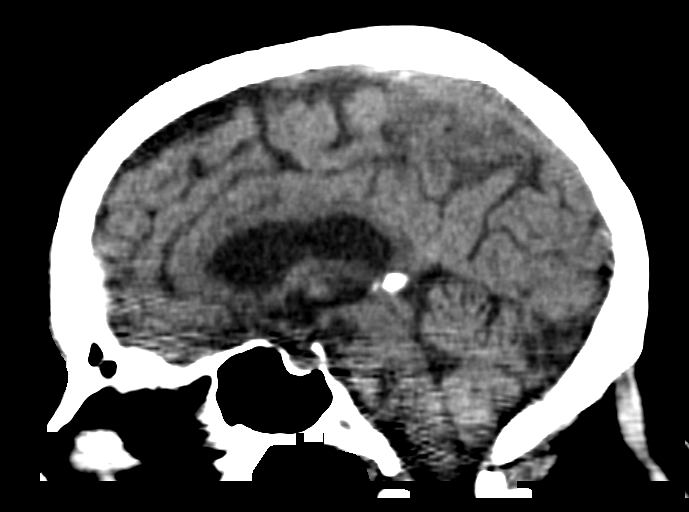
[im 32/49  brain]
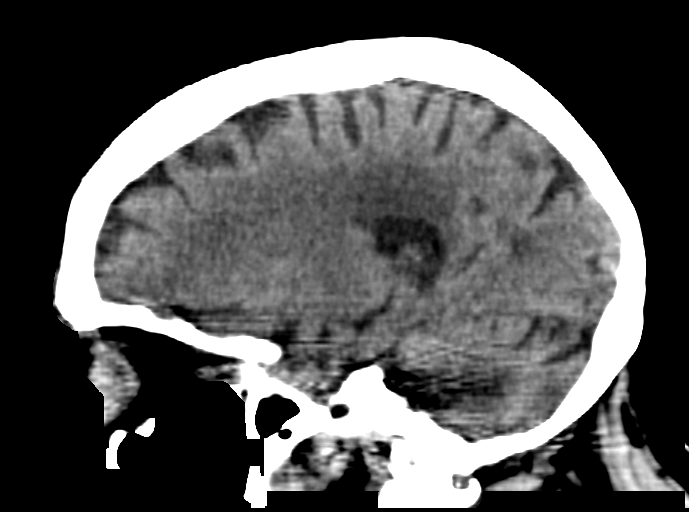

[15 of 47 positions shown; findings below may reference images not displayed]

FINDINGS: There is some atrophy and chronic microvascular change. No acute
abnormality including hemorrhage, infarct, mass lesion, mass effect,
midline shift or abnormal extra axial fluid collection is
identified. Imaged paranasal sinuses and mastoid air cells are
clear. The calvarium is intact.
IMPRESSION: No acute abnormality.

Atrophy and chronic microvascular ischemic change.
